# Patient Record
Sex: Female | Born: 1960 | Race: White | Hispanic: No | Marital: Married | Smoking: Never smoker
Health system: Southern US, Community
[De-identification: ages and names within clinical notes are randomized; demographics above are authoritative.]

## PROBLEM LIST (undated history)

## (undated) DIAGNOSIS — B0229 Other postherpetic nervous system involvement: Secondary | ICD-10-CM

## (undated) DIAGNOSIS — G1221 Amyotrophic lateral sclerosis: Secondary | ICD-10-CM

## (undated) HISTORY — PX: PARTIAL HYSTERECTOMY: SHX80

## (undated) HISTORY — DX: Other postherpetic nervous system involvement: B02.29

## (undated) HISTORY — PX: APPENDECTOMY: SHX54

---

## 2004-06-04 ENCOUNTER — Other Ambulatory Visit: Admission: RE | Admit: 2004-06-04 | Discharge: 2004-06-04 | Payer: Self-pay | Admitting: Family Medicine

## 2005-02-17 ENCOUNTER — Inpatient Hospital Stay (HOSPITAL_COMMUNITY): Admission: RE | Admit: 2005-02-17 | Discharge: 2005-02-19 | Payer: Self-pay | Admitting: Obstetrics and Gynecology

## 2005-02-17 ENCOUNTER — Encounter (INDEPENDENT_AMBULATORY_CARE_PROVIDER_SITE_OTHER): Payer: Self-pay | Admitting: Specialist

## 2005-05-25 ENCOUNTER — Encounter: Admission: RE | Admit: 2005-05-25 | Discharge: 2005-07-01 | Payer: Self-pay | Admitting: Family Medicine

## 2005-10-09 ENCOUNTER — Other Ambulatory Visit: Admission: RE | Admit: 2005-10-09 | Discharge: 2005-10-09 | Payer: Self-pay | Admitting: Obstetrics and Gynecology

## 2007-01-12 ENCOUNTER — Other Ambulatory Visit: Admission: RE | Admit: 2007-01-12 | Discharge: 2007-01-12 | Payer: Self-pay | Admitting: Family Medicine

## 2008-01-06 ENCOUNTER — Ambulatory Visit: Payer: Self-pay

## 2008-01-16 ENCOUNTER — Other Ambulatory Visit: Admission: RE | Admit: 2008-01-16 | Discharge: 2008-01-16 | Payer: Self-pay | Admitting: Family Medicine

## 2008-02-07 ENCOUNTER — Ambulatory Visit: Payer: Self-pay | Admitting: Cardiology

## 2009-11-04 ENCOUNTER — Other Ambulatory Visit: Admission: RE | Admit: 2009-11-04 | Discharge: 2009-11-04 | Payer: Self-pay | Admitting: Family Medicine

## 2011-03-03 NOTE — Assessment & Plan Note (Signed)
[facility]                            CARDIOLOGY OFFICE NOTE   SHAUGHNESSY, GETHERS                    MRN:          [id number]  DATE:02/07/2008                            DOB:          10-25-1960    REFERRING PHYSICIAN:  Melida Quitter, M.D.   I was asked by Dr. Holley Bouche to evaluate Lori Alvarez, a  pleasant very lively 50 year old married white female with palpitations.   She had a sinus infection about a month or so ago.  She took some over-  the-counter sinus decongestant medication.  She began to note some  palpitations and some fluttering sensations.   A CardioNet monitor was placed and it shows sinus tachycardia with  occasional PVCs.  There was no complex arrhythmia.   Her mother and sister have a history of mitral valve prolapse.  She had  a 2-D echocardiogram here in 1995, which did show prolapse, essentially  was a normal echo.  She denies orthopnea, PND or peripheral edema.  She  walks on a regular basis and denies any shortness of breath or chest  discomfort.   PAST MEDICAL HISTORY:   ALLERGIES:  SHE IS INTOLERANT TO SULFA, NITROFURANTOIN, DOXEPIN AND  NITROGLYCERIN OINTMENT.   She is not smoke, does not use any recreational products, does not drink  alcohol.   She has about one caffeinated beverage about every 3-4 days.  She tries  to walk 30 minutes a day.   She had an appendectomy and hysterectomy in the past.   CURRENT MEDICATIONS:  1. Zyrtec 10 mg two times a day.  2. Zantac 150 mg two times a day.  3. Actonel 35 mg a day.  4. Premarin 0.9 mg daily.  5. Mobic 15 mg a day.  6. Xanax 0.25 nightly as needed.  7. Various vitamins.   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   REVIEW OF SYSTEMS:  Negative other than chronic fatigue and some  allergies.   PHYSICAL EXAMINATION:  VITAL SIGNS:  She is 5 feet 3 inches and weighs  141 pounds.   Her blood pressure is 141/92, her pulse is 100 today.  It  is nice and regular.  GENERAL:  She is in no acute distress.  SKIN:  Warm and dry.  HEENT:  Unremarkable.  NECK:  Carotid upstrokes are equal bilaterally without bruits.  No JVD.  Thyroid is not enlarged or tender.  LUNGS:  Clear to auscultation.  HEART:  Reveals a nondisplaced PMI.  There is no murmur, rub or click.  S2 splits physiologically.  ABDOMEN:  Soft, good bowel sounds.  EXTREMITIES:  No clubbing, cyanosis or edema.  Pulses are intact.  NEURO:  Intact.  SKIN:  Unremarkable.   ASSESSMENT/PLAN:  1. Benign unifocal PVCs.  2. History of a normal 2-D echocardiogram with no evidence of      prolapse.  3. Healthy lifestyle.   RECOMMENDATIONS:  1. Increase walking to 3 hours per week.  2. Maintain weight.  3. Follow-up annual blood work, including lipids and  thyroid.  These      were recently checked and they were okay per the patient.  4. Reassurance.  5. Stay away from decongestants if at all possible.  6. Decrease caffeine or other stimulants as much as possible.  We will      see her back on a p.r.n. basis.     Thomas C. Daleen Squibb, MD, [facility]  Electronically Signed    TCW/MedQ  DD: 02/07/2008  DT: 02/07/2008  Job #: [id number]   cc:   Melida Quitter, M.D.

## 2011-03-06 NOTE — Discharge Summary (Signed)
Lori Alvarez, Lori Alvarez             ACCOUNT NO.:  [id number]   MEDICAL RECORD NO.:  [id number]          PATIENT TYPE:  INP   LOCATION:  9311                          FACILITY:  [facility]   PHYSICIAN:  Carrington Clamp, M.D. DATE OF BIRTH:  14-Sep-1961   DATE OF ADMISSION:  02/17/2005  DATE OF DISCHARGE:  02/19/2005                                 DISCHARGE SUMMARY   ADMITTING DIAGNOSES:  Menometrorrhagia secondary to symptomatic fibroid  uterus.   DISCHARGE DIAGNOSES:  Menometrorrhagia secondary to symptomatic fibroid  uterus.   PROCEDURES PERFORMED:  Total abdominal hysterectomy.   TEST RESULTS:  Preoperative H&H of 12.9 and 38.  Postoperative H&H 11.7 and  34.  Postoperative BUN and creatinine of 2 and 0.8.   HISTORY AND PHYSICAL:  This is a 50 year old G0 complaining of severe  menometrorrhagia responsive only to persistent doses of Ponstel, but still  bleeding through.  Patient has a large number of fibroids and desires  definitive therapy.   PAST MEDICAL HISTORY:  1.  Migraines.  2.  Chronic hives.  3.  Patient has a history of kidney stones.  4.  No coronary artery disease or diabetes.   PAST GYNECOLOGICAL HISTORY:  Negative for sexually transmitted diseases and  pelvic infections.   PAST OBSTETRICAL HISTORY:  None.   PAST SURGICAL HISTORY:  Patient had an open appendectomy without rupture.   ALLERGIES:  PERCOCET, SULFA, ALLEGRA, MACROBID, ROXOPIN, NITROGLYCERIN.   MEDICATIONS:  1.  Zyrtec 10 mg b.i.d.  2.  Zantac 150 mg b.i.d.  3.  Xanax p.r.n.   The patient did not smoke.   PHYSICAL EXAMINATION:  VITAL SIGNS:  Temperature 97, blood pressure 132/77.  GENERAL:  Well.  HEENT:  Anicteric.  NECK:  Without thyromegaly.  LUNGS:  Clear to auscultation bilaterally.  HEART:  Regular rate and rhythm.  ABDOMEN:  Soft, nontender, nondistended.  PELVIC:  Normal external female genitalia with normal vagina and cervix.  The uterus is approximately 12-14 weeks size.  Adnexa  and rectum were  benign.  EXTREMITIES:  Benign.  SKIN:  Benign.   Ultrasound showed 8 cm pedunculated fibroids in bilateral adnexa.   ASSESSMENT:  This is a 50 year old who does not desire fertility with a  significantly enlarged symptomatic fibroid uterus desiring definitive  therapy for her menometrorrhagia.  The patient was counseled and elected to  undergo a total abdominal hysterectomy.  Patient was to undergo bilateral  salpingo-oophorectomy only if the ovaries are frankly abnormal.  All risks,  benefits, and alternatives were discussed with the patient.  Patient desired  to proceed.  The patient was to receive SEDs and preoperative antibiotics.   HOSPITAL COURSE:  Patient underwent the above-named procedures on Feb 17, 2005 without complication.  Patient by postoperative day #2 was eating,  ambulating, and voiding without complication and was sent home with the  following.   MEDICATIONS:  1.  Premarin 0.3 mg daily.  2.  Ultram 50 mg q.4-6h. p.r.n. pain.  3.  Colace.  4.  Gas-X over-the-counter.   ACTIVITY:  Pelvic rest x6 weeks.  No lifting or straining x6 weeks.  No  driving x2.   DIET:  High fiber and high water.   WOUND CARE:  Shower only.   FOLLOW-UP:  The following Friday for staples to be removed.       MH/MEDQ  D:  04/23/2005  T:  04/23/2005  Job:  [id number]   cc:   Tiburcio Pea, M.D.

## 2011-03-06 NOTE — Op Note (Signed)
NAMEEDUARDO, Lori Alvarez             ACCOUNT NO.:  [id number]   MEDICAL RECORD NO.:  [id number]          PATIENT TYPE:  INP   LOCATION:  9399                          FACILITY:  [facility]   PHYSICIAN:  Carrington Clamp, M.D. DATE OF BIRTH:  Dec 02, 1960   DATE OF PROCEDURE:  DATE OF DISCHARGE:                                 OPERATIVE REPORT   Audio too short to transcribe (less than 5 seconds)      MH/MEDQ  D:  02/17/2005  T:  02/17/2005  Job:  [id number]

## 2011-03-06 NOTE — Op Note (Signed)
Lori Alvarez, CARRIGG             ACCOUNT NO.:  [id number]   MEDICAL RECORD NO.:  [id number]          PATIENT TYPE:  INP   LOCATION:  9399                          [facility]ACILITY:  [facility]   PHYSICIAN:  Carrington Clamp, M.D. DATE OF BIRTH:  1961/07/20   DATE OF PROCEDURE:  02/17/2005  DATE OF DISCHARGE:                                 OPERATIVE REPORT   PREOPERATIVE DIAGNOSES:  1.  Menometrorrhagia and fibroids.  2.  Symptomatic fibroid uterus.   POSTOPERATIVE DIAGNOSES:  1.  Menometrorrhagia and fibroids.  2.  Symptomatic fibroid uterus.   PROCEDURE:  Total abdominal hysterectomy.   SURGEON:  Carrington Clamp, M.D.   ASSISTANT:  __________.   ANESTHESIA:  General.   SPECIMENS:  Uterus and cervix which weighed greater than 300 g.   ESTIMATED BLOOD LOSS:  150 mL.   INTRAVENOUS FLUIDS:  1200 mL.   URINE OUTPUT:  400 mL.   COMPLICATIONS:  None.   FINDINGS:  About 14 week size uterus with multiple fibroids.  There were two  fibroids located in the cornua of the uterus.  The ovaries, tubes, and  ureters otherwise appeared normal.   MEDICATIONS:  None.   COUNTS:  Correct x3.   TECHNIQUE:  After adequate general anesthesia was achieved, the patient was  prepped and draped in the usual sterile fashion in dorsal supine position.  A Pfannenstiel skin incision was made with the scalpel and carried down to  the fascia with Bovie cautery.  The fascia was incised in the midline and  then carried in a transverse curvilinear manner with the Mayo scissors.  The  fascia was reflected superiorly and inferiorly from the rectus muscles and  the rectus muscles split in them midline.  Bowel-free portion of the  peritoneum was entered into bluntly and the peritoneum was then incised in  superior and inferior manner with the Metzenbaum scissors with good  visualization of the bowel and bladder.   The bowel was packed away with four wet laps and the O'Connor-O'Sullivan  retractor placed.  The  cornua of the uterus was grasped with a pair of  Kellys and the round ligaments were secured with a suture transfixion stitch  of 0 Vicryl.  The round ligaments were then divided with the Bovie cautery  which incorporated the peritoneum parallel to the infundibulopelvic ligament  and the vesicouterine fascia.  The vesicouterine fascia was carried around  to the other side where the opposite round ligament was obtained in the same  manner.  The bladder was retracted with blunt and sharp dissection.   The avascular portion of the mesosalpinx was entered into with the Bovie  cautery and Haney clamp was placed around the uterine ovarian ligament.  This was incised with the Mayo scissors and then secured with a free-hand  tie of 0 Vicryl.  This was followed with a stitch of 0 Vicryl as well.  The  same procedure was performed on the left-hand side and the vesicouterine  fascia retracted even further out of the field of dissection.   A pair of Haneys were placed at the level of the internal  os on the uterine  artery.  Each pedicle was incised with the Mayo scissor and then secured  with a stitch of 0 Vicryl.  The uterus was then amputated with the scalpel  and handed off the field.  The cervix was then grasped with a pair of  Kochers and the cardinal ligament was then divided with alternating  successive bites of the straight Haney.  Each pedicle was incised with the  scalpel and secured with a stitch of 0 Vicryl.  At the level of the  reflection of the vagina onto the cervix, a pair of Haneys were placed  bilaterally.  Each pedicle was incised with the Mayo scissors and secured  with a Heaney stitch of 0 Vicryl.  The uterine specimen was then excised  with the scalpel.  The cuff was closed with two additional figure-of-eight  stitches of 0 Vicryl.  Hemostasis was achieved with Bovie cautery along some  of the peritoneal edges but otherwise dry.  The instruments were withdrawn  from the  abdomen.   The peritoneum was then closed with a running stitch of 2-0 Vicryl.  The  fascia was closed with a running stitch of 0 PDS looped.  The subcutaneous  tissues secured with interrupted stitches of 2-0 plain gut.  The skin was  closed with staples.  The patient tolerated the procedure well and returned  to the recovery room in stable condition.      MH/MEDQ  D:  02/17/2005  T:  02/17/2005  Job:  [id number]

## 2015-01-22 DIAGNOSIS — D469 Myelodysplastic syndrome, unspecified: Secondary | ICD-10-CM | POA: Insufficient documentation

## 2015-08-15 ENCOUNTER — Other Ambulatory Visit: Payer: Self-pay | Admitting: Radiology

## 2017-07-30 ENCOUNTER — Other Ambulatory Visit: Payer: Self-pay | Admitting: Family Medicine

## 2017-07-30 DIAGNOSIS — M5412 Radiculopathy, cervical region: Secondary | ICD-10-CM

## 2017-08-08 ENCOUNTER — Other Ambulatory Visit: Payer: Self-pay

## 2017-08-15 ENCOUNTER — Other Ambulatory Visit: Payer: Self-pay

## 2017-08-30 ENCOUNTER — Other Ambulatory Visit: Payer: Self-pay | Admitting: Family Medicine

## 2017-08-30 ENCOUNTER — Ambulatory Visit
Admission: RE | Admit: 2017-08-30 | Discharge: 2017-08-30 | Disposition: A | Discharge status: Home / Self Care | Payer: Self-pay | Source: Ambulatory Visit | Attending: Family Medicine | Admitting: Family Medicine

## 2017-08-30 DIAGNOSIS — M542 Cervicalgia: Secondary | ICD-10-CM

## 2017-10-02 ENCOUNTER — Ambulatory Visit
Admission: RE | Admit: 2017-10-02 | Discharge: 2017-10-02 | Disposition: A | Discharge status: Home / Self Care | Payer: BC Managed Care – PPO | Source: Ambulatory Visit | Attending: Family Medicine | Admitting: Family Medicine

## 2017-10-02 DIAGNOSIS — M5412 Radiculopathy, cervical region: Secondary | ICD-10-CM

## 2017-11-11 DIAGNOSIS — B0229 Other postherpetic nervous system involvement: Secondary | ICD-10-CM | POA: Insufficient documentation

## 2017-11-11 DIAGNOSIS — R030 Elevated blood-pressure reading, without diagnosis of hypertension: Secondary | ICD-10-CM | POA: Insufficient documentation

## 2017-11-11 DIAGNOSIS — R29898 Other symptoms and signs involving the musculoskeletal system: Secondary | ICD-10-CM | POA: Insufficient documentation

## 2017-11-22 ENCOUNTER — Telehealth: Payer: Self-pay | Admitting: Neurology

## 2017-11-22 NOTE — Telephone Encounter (Signed)
Pt returned RN's call,she accepted the appt in the morning, I scheduled it. She was very Patent attorney to [employer]

## 2017-11-22 NOTE — Telephone Encounter (Signed)
Noted, thank you Lori Alvarez- Do you have this paper referral? She is going to come tomorrow at 730am, thank you!

## 2017-11-22 NOTE — Telephone Encounter (Signed)
Will bring the paper referral back when we bring the ones for Wednesday.         Thanks [employer]

## 2017-11-22 NOTE — Telephone Encounter (Signed)
Called and LVM for pt to call office back.  Can offer tomorrow morning at 730am, check in 700am for sooner appt if she calls, thank you

## 2017-11-22 NOTE — Telephone Encounter (Signed)
Patient is a new patient scheduled with Dr. Jannifer Franklin on 01/10/18 for muscle weakness. Patient has called and wants to speak with the nurse to see if she can be seen sooner. She said that the weakness has got to the point she can hardly raise her hands above her head.

## 2017-11-23 ENCOUNTER — Encounter: Payer: Self-pay | Admitting: Neurology

## 2017-11-23 ENCOUNTER — Other Ambulatory Visit: Payer: Self-pay

## 2017-11-23 ENCOUNTER — Ambulatory Visit: Payer: BC Managed Care – PPO | Admitting: Neurology

## 2017-11-23 VITALS — BP 141/74 | HR 100 | Ht 63.0 in | Wt 131.5 lb

## 2017-11-23 DIAGNOSIS — M6281 Muscle weakness (generalized): Secondary | ICD-10-CM | POA: Insufficient documentation

## 2017-11-23 NOTE — Patient Instructions (Signed)
   We will get EMG and NCV evaluation.

## 2017-11-23 NOTE — Progress Notes (Signed)
Reason for visit: Quadriparesis, progressive  Referring physician: Dr. Nils Alvarez is a 57 y.o. female  History of present illness:  [name] is a 57 year old right-handed white female with a history of a shingles outbreak that affected the left shoulder and arm in February 2015.  The patient had no weakness of the left arm at that time.  The patient has continued to have pain however, she has been on gabapentin in the past but did not gain much benefit from this.  The patient began noting bilateral arm weakness that began in December 2017.  Over the last year, the arm weakness has gradually worsened.  The patient received a shingles vaccination on 18 October 2017 and since that time she has had accelerated weakness of both upper extremities.  She is having difficulty with dropping things and lifting the arms above the head.  She has not noticed any weakness of the lower extremities.  She denies any numbness of the extremities, but she has not had any problems with speech or swallowing or double vision or ptosis of the eyes.  She has not noted any difficulty controlling the bowels or the bladder.  She has had not had any balance changes.  She has had routine blood work done through her primary care physician, she is also had a connective tissue disease workup that apparently was unremarkable.  The patient denies any significant muscle cramps, she does have some twitches in the muscles at times in the legs.  The patient has not noted any significant muscle atrophy.  She is sent to this office for an evaluation.  The arm weakness has significantly worsened over the last 2 or 3 weeks.  Past Medical History:  Diagnosis Date  . Postherpetic neuralgia     Past Surgical History:  Procedure Laterality Date  . APPENDECTOMY    . PARTIAL HYSTERECTOMY      Family History  Problem Relation Age of Onset  . Myopathy Mother   . Cancer Father        renal    Social history:  reports  that  has never smoked. she has never used smokeless tobacco. She reports that she does not drink alcohol or use drugs.  Medications:  Prior to Admission medications   Medication Sig Start Date End Date Taking? Authorizing Provider  ALPRAZolam Duanne Moron) 0.5 MG tablet Take 0.5 mg by mouth as needed for anxiety.   Yes [provider]  Black Cohosh (REMIFEMIN) 20 MG TABS Take 20 mg by mouth 2 (two) times daily.   Yes [provider]  CALCIUM-VITAMIN D PO Take 1 tablet by mouth daily. 600-800   Yes [provider]  cetirizine (ZYRTEC) 10 MG tablet Take 10 mg by mouth 2 (two) times daily.   Yes [provider]  Cyanocobalamin (VITAMIN B-12 PO) Take 500 mcg by mouth daily.   Yes [provider]  estrogens, conjugated, (PREMARIN) 0.9 MG tablet Take 0.9 mg by mouth daily. Take daily for 21 days then do not take for 7 days.   Yes [provider]  MAGNESIUM PO Take 250 mg by mouth 2 (two) times daily.   Yes [provider]  meloxicam (MOBIC) 15 MG tablet Take 15 mg by mouth daily.   Yes [provider]  Multiple Vitamins-Minerals (MULTIVITAMIN ADULTS PO) Take 1 tablet by mouth daily.   Yes [provider]  ranitidine (ZANTAC) 150 MG tablet Take 150 mg by mouth 2 (two) times  daily.   Yes [provider]  risedronate (ACTONEL) 35 MG tablet Take 35 mg by mouth every 7 (seven) days. with water on empty stomach, nothing by mouth or lie down for next 30 minutes.   Yes [provider]  VITAMIN E PO Take 400 Units by mouth daily.   Yes [provider]  Zinc 50 MG TABS Take 50 mg by mouth daily.   Yes [provider]      Allergies  Allergen Reactions  . Allegra [Fexofenadine]   . Clotrim-Undecyl-Tea Tr-Lav Oil   . Doxepin   . Nitroglycerin     Migraine  . Nitrofuran Derivatives Rash  . Sulfa Antibiotics Rash    ROS:  Out of a complete 14 system review of symptoms, the patient complains  only of the following symptoms, and all other reviewed systems are negative.  Fatigue Left arm and shoulder pain Muscle weakness Anxiety, decreased energy  Blood pressure (!) 141/74, pulse 100, height 5\' 3"  (1.6 m), weight 131 lb 8 oz (59.6 kg).  Physical Exam  General: The patient is alert and cooperative at the time of the examination.  Eyes: Pupils are equal, round, and reactive to light. Discs are flat bilaterally.  Neck: The neck is supple, no carotid bruits are noted.  Respiratory: The respiratory examination is clear.  Cardiovascular: The cardiovascular examination reveals a regular rate and rhythm, no obvious murmurs or rubs are noted.  Skin: Extremities are without significant edema.  Neurologic Exam  Mental status: The patient is alert and oriented x 3 at the time of the examination. The patient has apparent normal recent and remote memory, with an apparently normal attention span and concentration ability.  Cranial nerves: Facial symmetry is present. There is good sensation of the face to pinprick and soft touch bilaterally. The strength of the facial muscles and the muscles to head turning and shoulder shrug are normal bilaterally. Speech is well enunciated, no aphasia or dysarthria is noted. Extraocular movements are full. Visual fields are full. The tongue is midline, and the patient has symmetric elevation of the soft palate. No obvious hearing deficits are noted.  The patient has weakness with neck flexion, not with extension of the neck.  Motor: The motor testing reveals 4/5 strength of all 4 extremities, distally and proximally, possibly slightly weaker proximally on the left arm than the right, but quite symmetric in the legs. Good symmetric motor tone is noted throughout.  Sensory: Sensory testing is intact to pinprick, soft touch, vibration sensation, and position sense on all 4 extremities. No evidence of extinction is noted.  Coordination: Cerebellar testing  reveals good finger-nose-finger and heel-to-shin bilaterally.  Gait and station: Gait is normal. Tandem gait is normal. Romberg is negative. No drift is seen.  Reflexes: Deep tendon reflexes are symmetric and normal bilaterally. Toes are downgoing bilaterally.   MR cervical 10/02/17:   IMPRESSION: 1. 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 2. No spinal stenosis.  * MRI scan images were reviewed online. I agree with the written report.   Assessment/Plan:  1.  Quadriparesis, progressive  The patient appears to have diffuse muscle weakness affecting all 4 extremities including neck flexion.  The weakness is symmetric, and is equal with proximal and distal muscles.  No evidence of spasticity is seen.  No evidence of fasciculations or muscle atrophy are noted.  The patient will need to be evaluated for a progressive myopathy, Lambert-Eaton syndrome and myasthenia  gravis do need to be considered.  An anterior horn cell disease process is less likely given the absence of atrophy and presence of symmetry of weakness.  An endocrine based myopathy should also be considered.  The patient will have blood work done today, she will have nerve conduction studies on both arms and one leg, EMG on one arm and one leg.  She will follow-up for the EMG evaluation.  Jill Alexanders MD 11/23/2017 8:06 AM  [facility] [address] [place], [place] [address]  Phone [phone] Fax [phone]

## 2017-12-01 ENCOUNTER — Encounter: Payer: Self-pay | Admitting: Neurology

## 2017-12-01 ENCOUNTER — Ambulatory Visit (INDEPENDENT_AMBULATORY_CARE_PROVIDER_SITE_OTHER): Payer: BC Managed Care – PPO | Admitting: Neurology

## 2017-12-01 ENCOUNTER — Ambulatory Visit: Payer: BC Managed Care – PPO | Admitting: Neurology

## 2017-12-01 DIAGNOSIS — G1221 Amyotrophic lateral sclerosis: Secondary | ICD-10-CM | POA: Diagnosis not present

## 2017-12-01 DIAGNOSIS — M6281 Muscle weakness (generalized): Secondary | ICD-10-CM

## 2017-12-01 LAB — ANA W/REFLEX: Anti Nuclear Antibody(ANA): NEGATIVE

## 2017-12-01 LAB — SEDIMENTATION RATE: Sed Rate: 2 mm/hr (ref 0–40)

## 2017-12-01 LAB — PARANEOPLASTIC PROFILE 1: Neuronal Nuc Ab (Ri), IFA: <1:10 {titer}

## 2017-12-01 LAB — VGCC ANTIBODY: VGCC Antibody: NEGATIVE

## 2017-12-01 LAB — ACETYLCHOLINE RECEPTOR, MODULATING

## 2017-12-01 LAB — ACETYLCHOLINE RECEPTOR, BINDING: AChR Binding Ab, Serum: <0.03 nmol/L (ref 0.00–0.24)

## 2017-12-01 LAB — ANGIOTENSIN CONVERTING ENZYME: ANGIO CONVERT ENZYME: 24 U/L (ref 14–82)

## 2017-12-01 LAB — CK: CK TOTAL: 90 U/L (ref 24–173)

## 2017-12-01 LAB — HIV ANTIBODY (ROUTINE TESTING W REFLEX): HIV Screen 4th Generation wRfx: NONREACTIVE

## 2017-12-01 LAB — ACETYLCHOLINE RECEPTOR, BLOCKING: Acetylchol Block Ab: 9 % (ref 0–25)

## 2017-12-01 NOTE — Procedures (Signed)
HISTORY:  Lori Alvarez is a 57 year old patient with a history of gradual weakness of all 4 extremities that has come on over the last 1 year.  The weakness she believes was first noted on the left arm, but both arms and now involved and she is weak in both legs.  The patient is being evaluated for a possible myopathic disorder.  NERVE CONDUCTION STUDIES:  Nerve conduction studies were performed on both upper extremities.  The distal motor latencies for the median and ulnar nerves are within normal limits bilaterally with low motor amplitudes for the median nerves bilaterally and for the left ulnar nerve, normal for the right ulnar nerve.  The nerve conduction velocities for the median and ulnar nerves were normal bilaterally.  The sensory latencies for the median nerves were slightly prolonged on the left, normal on the right and normal for the ulnar nerves bilaterally.  The F-wave latencies for the ulnar nerves were normal bilaterally.  Nerve conduction studies were performed on the left lower extremity. The distal motor latencies and motor amplitudes for the peroneal and posterior tibial nerves were within normal limits. The nerve conduction velocities for these nerves were also normal. The sensory latencies for the peroneal and sural nerves were within normal limits.  The F-wave latency for the left posterior tibial nerve was normal.   EMG STUDIES:  EMG study was performed on the left upper extremity:  The first dorsal interosseous muscle reveals 2 to 4 K units with decreased recruitment. No fibrillations or positive waves were noted. The abductor pollicis brevis muscle reveals 2 to 4 K units with moderately decreased recruitment.  3+ fibrillations and positive waves were noted. The extensor indicis proprius muscle reveals 1 to 3 K units with decreased recruitment.  1+ fibrillations and positive waves were noted. The pronator teres muscle reveals 2 to 3 K units with decreased  recruitment.  3+ fibrillations and positive waves were noted. The biceps muscle reveals 1 to 2 K units with decreased recruitment.  2+ fibrillations and positive waves were noted. The triceps muscle reveals 2 to 4 K units with slightly decreased recruitment.  1+ fibrillations and positive waves were noted. The anterior deltoid muscle reveals 2 to 3 K units with decreased recruitment.  2+ fibrillations and positive waves were noted. The cervical paraspinal muscles were tested at 2 levels. No abnormalities of insertional activity were seen at either level tested. There was poor relaxation.  EMG study was performed on the left lower extremity:  The tibialis anterior muscle reveals 2 to 4K motor units with full recruitment. No fibrillations or positive waves were seen. The peroneus tertius muscle reveals 2 to 4K motor units with full recruitment. No fibrillations or positive waves were seen. The medial gastrocnemius muscle reveals 1 to 3K motor units with full recruitment. No fibrillations or positive waves were seen. The vastus lateralis muscle reveals 2 to 4K motor units with full recruitment. No fibrillations or positive waves were seen. The iliopsoas muscle reveals 2 to 4K motor units with full recruitment. No fibrillations or positive waves were seen. The biceps femoris muscle (long head) reveals 2 to 4K motor units with full recruitment. No fibrillations or positive waves were seen. The lumbosacral paraspinal muscles were tested at 3 levels, and revealed no abnormalities of insertional activity at all 3 levels tested. There was fair relaxation.    IMPRESSION:  Nerve conduction studies done on both upper extremities shows low motor amplitudes for the median nerves bilaterally and  in the left ulnar nerve.  There is relative sensory sparing.  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  Given the clinical  history and MRI findings, the possibility of an anterior horn cell disease process such as ALS should be considered.  Jill Alexanders MD 12/01/2017 3:19 PM  [facility] [address] [place], [place] [address]  Phone [phone] Fax [phone]

## 2017-12-01 NOTE — Progress Notes (Addendum)
Patient comes in today for EMG and nerve conduction study evaluation.  The patient has weakness on all 4 extremities that is relatively symmetric.  Reflexes are well-maintained on all 4 extremities.  The nerve conduction studies showed low motor amplitudes in the upper extremities, normal in the left lower extremity. 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  MRI of the cervical spine does not explain the symptoms involving the left arm.  The possibility of an anterior horn cell disease process is entertained and discussed with the patient.  The patient has been having progressive symptoms for about a year and a half.  We will get a second opinion through Dr. Vallarie Mare at [facility].  The patient will go on gabapentin for the post herpetic neuralgia pain that she has in her shoulder area.  She will call for any dose adjustments, if the gabapentin is not effective we will use carbamazepine.    [place]    Nerve / Sites Muscle Latency Ref. Amplitude Ref. Rel Amp Segments Distance Velocity Ref. Area    ms ms mV mV %  cm m/s m/s mVms  L Median - APB     Wrist APB 4.2 ?4.4 2.8 ?4.0 100 Wrist - APB 7   13.4     Upper arm APB 7.8  2.7  94.9 Upper arm - Wrist 20 55 ?49 13.0  R Median - APB     Wrist APB 3.6 ?4.4 3.9 ?4.0 100 Wrist - APB 7   15.4     Upper arm APB 6.9  3.7  96.2 Upper arm - Wrist 19 59 ?49 15.1  L Ulnar - ADM     Wrist ADM 3.3 ?3.3 5.0 ?6.0 100 Wrist - ADM 7   17.7     B.Elbow ADM 6.3  4.6  91.7 B.Elbow - Wrist 17 56 ?49 17.2     A.Elbow ADM 8.1  4.5  97 A.Elbow - B.Elbow 10 56 ?49 17.6         A.Elbow - Wrist      R Ulnar - ADM     Wrist ADM 3.2 ?3.3 7.7 ?6.0 100 Wrist - ADM 7   18.8     B.Elbow ADM 5.8  7.0  90 B.Elbow - Wrist 17 64 ?49 17.2     A.Elbow ADM 7.3  7.1  102 A.Elbow - B.Elbow 10 66 ?49 16.8         A.Elbow - Wrist      L Peroneal - EDB     Ankle EDB 4.4 ?6.5 5.2 ?2.0 100 Ankle - EDB 9   15.8   Fib head EDB 10.1  4.7  90.5 Fib head - Ankle   ?44 14.8     Pop fossa EDB 11.8  5.5  118 Pop fossa - Fib head 10 56 ?44 16.4         Pop fossa - Ankle      L Tibial - AH     Ankle AH 4.4 ?5.8 12.4 ?4.0 100 Ankle - AH 9   29.6     Pop fossa AH 12.1  10.3  83.2 Pop fossa - Ankle 37 48 ?41 25.6                 SNC    Nerve / Sites Rec. Site Peak Lat Ref.  Amp Ref. Segments Distance Peak Diff Ref.    ms ms V V  cm ms ms  L Sural - Ankle (Calf)     Calf Ankle 4.3 ?4.4 12 ?6 Calf - Ankle 14    L Superficial peroneal - Ankle     Lat leg Ankle 3.6 ?4.4 7 ?6 Lat leg - Ankle 14    L Median, Ulnar - Transcarpal comparison     Median Palm Wrist 2.8 ?2.2 32 ?35 Median Palm - Wrist 8       Ulnar Palm Wrist 2.1 ?2.2 24 ?12 Ulnar Palm - Wrist 8          Median Palm - Ulnar Palm  0.6 ?0.4  R Median, Ulnar - Transcarpal comparison     Median Palm Wrist 2.0 ?2.2 58 ?35 Median Palm - Wrist 8       Ulnar Palm Wrist 2.0 ?2.2 32 ?12 Ulnar Palm - Wrist 8          Median Palm - Ulnar Palm  0.1 ?0.4  L Median - Orthodromic (Dig II, Mid palm)     Dig II Wrist 3.5 ?3.4 16 ?10 Dig II - Wrist 13    R Median - Orthodromic (Dig II, Mid palm)     Dig II Wrist 3.0 ?3.4 26 ?10 Dig II - Wrist 13    L Ulnar - Orthodromic, (Dig V, Mid palm)     Dig V Wrist 2.8 ?3.1 13 ?5 Dig V - Wrist 11    R Ulnar - Orthodromic, (Dig V, Mid palm)     Dig V Wrist 2.5 ?3.1 25 ?5 Dig V - Wrist 61                       F  Wave    Nerve F Lat Ref.   ms ms  L Ulnar - ADM 26.8 ?32.0  L Tibial - AH 45.3 ?56.0  R Ulnar - ADM 26.1 ?32.0           EMG full

## 2017-12-01 NOTE — Progress Notes (Signed)
Please refer to EMG and nerve conduction study procedure note. 

## 2017-12-07 ENCOUNTER — Telehealth: Payer: Self-pay | Admitting: Neurology

## 2017-12-07 NOTE — Telephone Encounter (Signed)
Patient had NCV/EMG on 12-01-17. The day after she had swelling on her left arm and used ice pack and swelling went down. The next day her left arm was bruised and  is still bruised. Should she be concerned?

## 2017-12-07 NOTE — Telephone Encounter (Signed)
Spoke to patient - states her swelling resolved with ice and now she just has some bruising on her arm.  She had the school nurse, at her place of employment, take a look at her arm.  No swelling, redness or heat at site.  The nurse felt it was just a bruise and was healing at an expected pace.  The patient appreciated the return call.

## 2017-12-23 DIAGNOSIS — G1221 Amyotrophic lateral sclerosis: Secondary | ICD-10-CM | POA: Insufficient documentation

## 2018-01-10 ENCOUNTER — Ambulatory Visit: Payer: BC Managed Care – PPO | Admitting: Neurology

## 2018-01-24 ENCOUNTER — Ambulatory Visit: Payer: BC Managed Care – PPO | Admitting: Neurology

## 2018-02-28 ENCOUNTER — Ambulatory Visit
Admission: RE | Admit: 2018-02-28 | Discharge: 2018-02-28 | Disposition: A | Discharge status: Home / Self Care | Payer: BC Managed Care – PPO | Source: Ambulatory Visit | Attending: Family Medicine | Admitting: Family Medicine

## 2018-02-28 ENCOUNTER — Other Ambulatory Visit: Payer: Self-pay | Admitting: Family Medicine

## 2018-02-28 DIAGNOSIS — M546 Pain in thoracic spine: Secondary | ICD-10-CM

## 2018-07-20 DIAGNOSIS — R208 Other disturbances of skin sensation: Secondary | ICD-10-CM | POA: Insufficient documentation

## 2018-07-28 ENCOUNTER — Ambulatory Visit: Payer: BC Managed Care – PPO | Admitting: Psychiatry

## 2018-07-28 ENCOUNTER — Encounter: Payer: Self-pay | Admitting: Psychiatry

## 2018-07-28 DIAGNOSIS — G1221 Amyotrophic lateral sclerosis: Secondary | ICD-10-CM

## 2018-07-28 DIAGNOSIS — F4323 Adjustment disorder with mixed anxiety and depressed mood: Secondary | ICD-10-CM | POA: Diagnosis not present

## 2018-07-28 DIAGNOSIS — K52831 Collagenous colitis: Secondary | ICD-10-CM | POA: Diagnosis not present

## 2018-07-28 NOTE — Progress Notes (Signed)
[facility] Counselor/Therapist Progress Note   Patient ID: Lori Alvarez, MRN: [id number]  Date: 07/28/2018  Timespent: 50 minutes  Start time: 3:11p Stop time: 4:13p  Treatment Type: Individual  Subjective:  Here c/o friend Lori Alvarez, former PT.  Cont allergic colitis, flared with reduced medication.  On phone with GI office at arrival time.   Cont to weaken in arms and lose fine motor dexterity.  Glad to get to go to the [employer] event last night, fatigued today ("a good tired").  Trying out housekeepers lately, recently engaged a very good fit wit ha very professional, economical cleaner just going into business.    Had meeting with H and Senior Pastor Lori Alvarez), established awareness that she is in therapy here, fostered compassionate presence and openness about her illness and PT's wishes about privacy within the church. Beginning to let H help her with ADLs.  H is scheduled with colleague (F. May) next month.    Up to about 8-9 friends now who know.  Many of them want to discuss her weight loss, and advise her to eat, which is awkward/off-putting.  Put together an email briefing to her set of friends, educating them on her wishes for what to talk about and not have to.  Good reception from all but one, Lori Alvarez, who has not yet responded.  Considering prompting her to look at her email by (artificially) sending a cute share email, texting her to let her know its there, and hoping it is bait enough for her to see PT's more serious message, which, though a group message, had Lori Alvarez most in mind as the friend to redirect about intrusive questions.  Has met new neurologist, Dr. Tillman Abide, with [facility].  Picked up on a cancellation that seems providential.  Very professional, attentive resident involved.  Very positive experience of doctor reviewing her notes taken, to validate and reconcile information; sharp contrast to previous neurologist, who had twice snatched her notes  away from her.  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  New neuro very understanding about her declining the infusion prior neuro demanded she take.  Has f/u with OT and Respiratory Tx at the prior clinic, cont to get services.  Working to pace her exposure to PTs worse off with ALS.  See God acting in access, professionalism, rapport, and   Interventions:Strength-based, Assertiveness Training and Supportive  Reviewed problems list in system, decided which dxs to include or keep private.  Affirmed assertiveness with friends and growth in directness, disclosure, and trust.  Advised against oblique ways of prompting Lori Alvarez, better to either let Lori Alvarez see it in her own time and bring it up or ask directly.  Also discussed using the reward principle, I.e., when Lori Alvarez intrudes then retracts ("That's OK.  You don't want to talk about that.") to say "Thank you."  Mental Status Exam:   Appearance:   Neat and Well Groomed     Behavior:  Appropriate and Sharing  Motor:    Muscle weakness, dropped purse x1  Speech/Language:   Clear and Coherent  Affect:  Appropriate and Congruent  Mood:  dysthymic and much milder  Thought process:  Coherent  Thought content:    Logical  Perceptual disturbances:    Normal  Orientation:  Full (Time, Place, and Person)  Attention:  Good  Concentration:  good  Memory:  N/A  Fund of knowledge:   Good  Insight:    Good  Judgment:  Good  Impulse Control:  good    Reported Symptoms:  Fatigability, weakness, social anxiety (much less), dysphoria (reduced)  Risk Assessment: Danger to Self:  No Self-injurious Behavior: No Danger to Others: No Duty to Warn:no Physical Aggression / Violence:No  Access to Firearms a concern: No  Gang Involvement:No   Diagnosis:   ICD-10-CM   1. Adjustment disorder with mixed anxiety and depressed mood F43.23    Primarily about degenerative neuro illness Declines sharing in Problem List (07/28/18 AM)  2. Collagenous colitis  K52.831   3. ALS (amyotrophic lateral sclerosis) ([place]) G12.21      Plan:  . Option to be more direct with friend Lori Alvarez, in hopes it communicates trust and avoids misinterpretation . Continue to utilize previously learned skills ad lib . Maintain medication, if prescribed, and work faithfully with relevant prescriber(s) . Call the clinic on-call service, present to ER, or call 911 if any life-threatening emergency   Blanchie Serve, PhD

## 2018-07-28 NOTE — Patient Instructions (Signed)
Great work communicating with your support system!  I hope you find it becomes increasingly OK to be direct with the people you trust, and that they keep responding like the mature people they are.

## 2018-08-09 DIAGNOSIS — R682 Dry mouth, unspecified: Secondary | ICD-10-CM | POA: Insufficient documentation

## 2018-08-19 DIAGNOSIS — K52831 Collagenous colitis: Secondary | ICD-10-CM | POA: Diagnosis not present

## 2018-08-24 ENCOUNTER — Ambulatory Visit (INDEPENDENT_AMBULATORY_CARE_PROVIDER_SITE_OTHER): Payer: Medicare Other | Admitting: Psychiatry

## 2018-08-24 DIAGNOSIS — K52831 Collagenous colitis: Secondary | ICD-10-CM

## 2018-08-24 DIAGNOSIS — F4323 Adjustment disorder with mixed anxiety and depressed mood: Secondary | ICD-10-CM | POA: Diagnosis not present

## 2018-08-24 DIAGNOSIS — G1221 Amyotrophic lateral sclerosis: Secondary | ICD-10-CM | POA: Diagnosis not present

## 2018-08-24 NOTE — Progress Notes (Signed)
      [facility] Counselor/Therapist Progress Note   Patient ID: Lori Alvarez, MRN: [id number]  Date:  08/24/2018  Start time: 2:15p    Stop time: 3:20p Time Spent: 65 min  Treatment Type: Individual  Subjective: Tough day today for sleep loss.  Stress navigating change to Medicare plus [employer] and getting both to figure out new roles and benefits.  C/o increasing weakness in arms, attrib to progress of ALS.  qAM wakes up weak and tearful, then showering is "torture", with need of husband's physical help.  Using a heavy tericloth towel robe now as a a form-fitting, low-effort towel.  Hand strength waning, especially right hand, and accommodations for handwriting not necessarily working.  Hard to summon for breakfast sometimes, given how depressing it is.  Trying to practice gratitude for abilities that remain, but it is substantially depressing to see only decline in physical capacities, and off-putting to see prospect of being asked a lot how she is doing.    Positive experience working with new physician, Deliah Goody, [place] [facility].  Mesalamine DR now prescribed for collagenous colitis.  Hopeful of better effect.  Increased difficulty sleeping, responds to 0.5mg  Xanax, sometimes needs another 0.25mg  after waking.  Adjusted electronic screen, tried melatonin (which hit her too hard).  Biggest detriment to sleep is sister's pushiness (e.g., about not notifying family of her ALS, need to "forgive" her aunt, and incorrigible monologuing when she purports to be getting in touch to be "supportive").  Has an unanswered email from her provoke sensitive reaction.   Difficult establishing boundaries for phone calls.  Interventions: Assertiveness/Communication and Ego-Supportive Processed issues with sister's dysfunctional support, weighing communication options.  Best-liked option to ask sister, when she presses the moment, "What do you need from me right  now?"  Mental Status Exam:    Appearance:   Neat     Behavior:  Appropriate  Motor:  Normal and except ALS-related weakness  Speech/Language:   Clear and Coherent  Affect:  Appropriate  Mood:  depressed  Thought process:  normal  Thought content:    WNL  Sensory/Perceptual disturbances:    WNL  Orientation:  WNL  Attention:  Good  Concentration:  Good  Memory:  WNL  Fund of knowledge:   Good  Insight:    Good  Judgment:   Good  Impulse Control:  Good   Risk Assessment: Danger to Self:  No Self-injurious Behavior: No Danger to Others: No Duty to Warn:no Physical Aggression / Violence:No  Access to Firearms a concern: No   Diagnosis:   ICD-10-CM   1. Adjustment disorder with mixed anxiety and depressed mood F43.23   2. ALS (amyotrophic lateral sclerosis) ([facility]) G12.21   3. Collagenous colitis K52.831    Plan:  . Try assertiveness options with sister . Continue to utilize previously learned skills ad lib . Maintain medication, if prescribed, and work faithfully with relevant prescriber(s) . Call the clinic on-call service, present to ER, or call 911 if any life-threatening emergency . Follow up with me in about 3 weeks, as able    Blanchie Serve, PhD

## 2018-09-20 ENCOUNTER — Ambulatory Visit (INDEPENDENT_AMBULATORY_CARE_PROVIDER_SITE_OTHER): Payer: Medicare Other | Admitting: Psychiatry

## 2018-09-20 DIAGNOSIS — K52831 Collagenous colitis: Secondary | ICD-10-CM

## 2018-09-20 DIAGNOSIS — F4323 Adjustment disorder with mixed anxiety and depressed mood: Secondary | ICD-10-CM

## 2018-09-20 DIAGNOSIS — G1221 Amyotrophic lateral sclerosis: Secondary | ICD-10-CM

## 2018-09-20 NOTE — Progress Notes (Signed)
Psychotherapy Progress Note -- Lori Moore, PhD, [facility]  Patient ID: Lori Alvarez     MRN: [id number]     Date: 09/20/2018     Treatment Type: Individual psychotherapy Start: 2:14p Stop: 3:14p Time Spent: 60 min Accompanied by: none  Self-Report (interim history, self-report of stressors and symptoms, application of prior therapy, status changes) Energy continues to wane, attributed to ALS and limited ability to eat due to colitis.  Felt a need for variety and had a few chicken nuggets and fries, but could not tolerate more than a few.  Generally experiences urgent bowel movements 2 hours after eating.  Attended a friend's family funeral, unexpectedly and uncomfortably ran into principal.  On new nonsteroidal medication.  Second-guesses original decision to go on infusion, as colitis showed up afterwards.  Still reassured by new GI dr.  Buddy Duty a melatonin 1mg  at night, which is very effective.  Has begun an evening light curfew.  Struggling with grief as holidays come on.  Difficult negotiating with sister about holiday decorating -- can't do the work, would rather not go to the trouble, and would feel bad if she let sister do for her.  Also chronic tendencies for sister to offer help then change the subject without picking up PT's answers, be too restless to hear feelings, and to imply expectations when asking PT's wishes.  Grieves lack of effective empathy from sister Lori Alvarez and the old support, understanding, and belonging that came with mother, now deceased 50 years.  Trying to use fine motor dexterity wherever possible, including handwriting rather than typing or texting, but hands do give out more easily over time.  Has considered consulting a nutritionist, but figures they could not do much for her.  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  Continues to pray for a miracle, albeit with the  understanding that God's healing does not necessarily mean in this life, and the prospect of decline and death remains daunting  Therapies used: Cognitive Behavioral Therapy, Assertiveness/Communication, Solution-Oriented/Positive Psychology, Humanistic/Existential and faith-sensitive  Intervention notes: Support for what appears to be intractable and increasing disability and for lonely making experiences of inconsistent, conflicted, or non-understanding attempts to sympathize, to her up, or direct her energies.  Psychoeducation about fried fast food and digestion and about anecdotal evidence that an anti-inflammatory diet may help both with colitis and ALS.  Reviewed sleep measures and practice, affirming use of blue light control and regular bedtimes.  Reviewed efforts to overcome fine motor limitations, endorsing both attempts to use motor skills and to modify tasks like card writing with stickers, stamps, having huisband sign when her hand gives out, etc.  Process unnecessary guilt over feelings toward former principal and sister and normalized as some progress in direct attempts to communicate with them where her needs or changes in behavior require them.  Mental Status/Observations:     Appearance:   Neat     Behavior:  Appropriate  Motor:  weakened fine motor control; grossly intact otherwise  Speech/Language:   Normal Rate and possibly slight dysarthria, not sure  Affect:  Appropriate and Depressed, tearful at points  Mood:  anxious and depressed  Thought process:  normal  Thought content:    WNL  Sensory/Perceptual disturbances:    WNL  Orientation:  WNL  Attention:  Good  Concentration:  Good  Memory:  WNL  Fund of knowledge:   NA  Insight:  Good  Judgment:   Good  Impulse Control:  Good   Risk Assessment: Danger to Self:  No Self-injurious Behavior: No Danger to Others: No Duty to Warn:no Physical Aggression / Violence:No  Access to Firearms a concern: No    Diagnosis:   ICD-10-CM   1. Adjustment disorder with mixed anxiety and depressed mood F43.23   2. ALS (amyotrophic lateral sclerosis) (H[facility]) G12.21   3. Collagenous colitis K52.831     Progress rating:  No change  Plan:  . Option to see a nutritionist or attempt anti-inflammatory diet on her own.  Brief educational materials available if desired. . Continue to utilize previously learned skills ad lib . Maintain medication, if prescribed, and work faithfully with relevant prescriber(s) . Call the clinic on-call service, present to ER, or call 911 if any life-threatening emergency . Follow up with me in about 2-4 weeks as able  Blanchie Serve, PhD

## 2018-09-21 ENCOUNTER — Ambulatory Visit: Payer: BC Managed Care – PPO | Admitting: Psychiatry

## 2018-10-17 ENCOUNTER — Ambulatory Visit: Payer: Medicare Other | Admitting: Psychiatry

## 2018-10-21 DIAGNOSIS — R197 Diarrhea, unspecified: Secondary | ICD-10-CM | POA: Diagnosis not present

## 2018-10-21 DIAGNOSIS — R634 Abnormal weight loss: Secondary | ICD-10-CM | POA: Diagnosis not present

## 2018-10-21 DIAGNOSIS — R109 Unspecified abdominal pain: Secondary | ICD-10-CM | POA: Diagnosis not present

## 2018-10-21 DIAGNOSIS — K52831 Collagenous colitis: Secondary | ICD-10-CM | POA: Diagnosis not present

## 2018-10-26 DIAGNOSIS — M791 Myalgia, unspecified site: Secondary | ICD-10-CM | POA: Diagnosis not present

## 2018-10-26 DIAGNOSIS — M7918 Myalgia, other site: Secondary | ICD-10-CM | POA: Diagnosis not present

## 2018-10-26 DIAGNOSIS — R4589 Other symptoms and signs involving emotional state: Secondary | ICD-10-CM | POA: Diagnosis not present

## 2018-10-26 DIAGNOSIS — R29898 Other symptoms and signs involving the musculoskeletal system: Secondary | ICD-10-CM | POA: Diagnosis not present

## 2018-10-26 DIAGNOSIS — Z79899 Other long term (current) drug therapy: Secondary | ICD-10-CM | POA: Diagnosis not present

## 2018-10-26 DIAGNOSIS — K529 Noninfective gastroenteritis and colitis, unspecified: Secondary | ICD-10-CM | POA: Diagnosis not present

## 2018-10-26 DIAGNOSIS — R531 Weakness: Secondary | ICD-10-CM | POA: Diagnosis not present

## 2018-10-26 DIAGNOSIS — R634 Abnormal weight loss: Secondary | ICD-10-CM | POA: Diagnosis not present

## 2018-10-26 DIAGNOSIS — R197 Diarrhea, unspecified: Secondary | ICD-10-CM | POA: Diagnosis not present

## 2018-11-18 DIAGNOSIS — R634 Abnormal weight loss: Secondary | ICD-10-CM | POA: Diagnosis not present

## 2018-11-18 DIAGNOSIS — R630 Anorexia: Secondary | ICD-10-CM | POA: Diagnosis not present

## 2018-11-18 DIAGNOSIS — K52831 Collagenous colitis: Secondary | ICD-10-CM | POA: Diagnosis not present

## 2018-11-18 DIAGNOSIS — F329 Major depressive disorder, single episode, unspecified: Secondary | ICD-10-CM | POA: Diagnosis not present

## 2018-11-18 DIAGNOSIS — G1221 Amyotrophic lateral sclerosis: Secondary | ICD-10-CM | POA: Diagnosis not present

## 2018-12-15 ENCOUNTER — Ambulatory Visit (INDEPENDENT_AMBULATORY_CARE_PROVIDER_SITE_OTHER): Payer: Medicare Other | Admitting: Psychiatry

## 2018-12-15 DIAGNOSIS — F321 Major depressive disorder, single episode, moderate: Secondary | ICD-10-CM

## 2018-12-15 NOTE — Progress Notes (Signed)
[facility] Counselor Initial Adult Exam  Name: Lori Alvarez Date: 12/15/2018 MRN: [id number] DOB: 05-05-1961 PCP: Shirline Frees, MD  Time spent:  60 minutes   Guardian/Payee:  patient  Paperwork requested:  No   Reason for Visit /Presenting Problem: referred due to depression re: health concerns (ALS) December 23, 2017 .  Appts need to be in afternoon due to weakness.    Depressed, Anxious (fluctuates and rates herself as a "5"  on depression and anxiety)  Mental Status Exam:   Appearance:   Casual     Behavior:  Appropriate and Sharing  Motor:  Normal  Speech/Language:   Clear and Coherent  Affect:  Congruent  Mood:  anxious  Thought process:  normal  Thought content:    WNL  Sensory/Perceptual disturbances:    WNL  Orientation:  oriented to person, place, time/date, situation, day of week, month of year and year  Attention:  Good  Concentration:  Good  Memory:  WNL  Fund of knowledge:   Good  Insight:    Good  Judgment:   Good  Impulse Control:  Good   Reported Symptoms:    Risk Assessment: Danger to Self:  No Self-injurious Behavior: No Danger to Others: No Duty to Warn:no Physical Aggression / Violence:No  Access to Firearms a concern: No  Gang Involvement:No  Patient / guardian was educated about steps to take if suicide or homicide risk level increases between visits:   Denies SI nor HI. While future psychiatric events cannot be accurately predicted, the patient does not currently require acute inpatient psychiatric care and does not currently meet [facility] involuntary commitment criteria.  Substance Abuse History: Current substance abuse: No     Past Psychiatric History:   No previous psychological problems have been observed Outpatient Providers: History of Psych Hospitalization: No  Psychological Testing: N/A   Abuse History: Victim of No., none   Report needed: No. Victim of Neglect:No. Perpetrator of none  Witness / Exposure to Domestic  Violence: No   Protective Services Involvement: No  Witness to [employer] Violence:  No   Family History:  Family History  Problem Relation Age of Onset  . Myopathy Mother   . Cancer Father        renal    Living situation: the patient lives with their spouse  Sexual Orientation:  Straight  Relationship Status: married  Name of spouse / other:             If a parent, number of children- no kids  Support Systems;  Husband, best friend Mariann Laster, close friends  Financial Stress:  No   Income/Employment/Disability: retirement and husband's income  Armed forces logistics/support/administrative officer: No   Educational History: Education: Scientist, product/process development:   [employer]  Any cultural differences that may affect / interfere with treatment:  not applicable  Recreation/Hobbies: reading, scrapbooking, crafts  Stressors:  Dx of ALS last March 2019 (husband is her caregiver and he also has some health issues)  Strengths:  Church and faith, prayer, Darrick Meigs friends  Barriers:   health issues  Legal History: Pending legal issue / charges: The patient has no significant history of legal issues. History of legal issue / charges: none  Medical History/Surgical History:Reviewed with patient who reports she is no longer taking Premarin, Zantac, Zinc, Magnesium. Past Medical History:  Diagnosis Date  . Postherpetic neuralgia     Past Surgical History:  Procedure Laterality Date  . APPENDECTOMY    . PARTIAL HYSTERECTOMY  Medications: Current Outpatient Medications  Medication Sig Dispense Refill  . ALPRAZolam (XANAX) 0.5 MG tablet Take 0.5 mg by mouth as needed for anxiety.    . Black Cohosh (REMIFEMIN) 20 MG TABS Take 20 mg by mouth 2 (two) times daily.    Marland Kitchen CALCIUM-VITAMIN D PO Take 1 tablet by mouth daily. 600-800    . cetirizine (ZYRTEC) 10 MG tablet Take 10 mg by mouth 2 (two) times daily.    . Cyanocobalamin (VITAMIN B-12 PO) Take 500 mcg by mouth daily.    Marland Kitchen  estrogens, conjugated, (PREMARIN) 0.9 MG tablet Take 0.9 mg by mouth daily. Take daily for 21 days then do not take for 7 days.    Marland Kitchen MAGNESIUM PO Take 250 mg by mouth 2 (two) times daily.    . meloxicam (MOBIC) 15 MG tablet Take 15 mg by mouth daily.    . Multiple Vitamins-Minerals (MULTIVITAMIN ADULTS PO) Take 1 tablet by mouth daily.    . ranitidine (ZANTAC) 150 MG tablet Take 150 mg by mouth 2 (two) times daily.    . risedronate (ACTONEL) 35 MG tablet Take 35 mg by mouth every 7 (seven) days. with water on empty stomach, nothing by mouth or lie down for next 30 minutes.    Marland Kitchen VITAMIN E PO Take 400 Units by mouth daily.    . Zinc 50 MG TABS Take 50 mg by mouth daily.     No current facility-administered medications for this visit.     Allergies  Allergen Reactions  . Allegra [Fexofenadine]   . Clotrim-Undecyl-Tea Tr-Lav Oil   . Doxepin   . Nitroglycerin     Migraine  . Nitrofuran Derivatives Rash  . Sulfa Antibiotics Rash    Diagnoses:  F32.1   Moderate major depression, single episode ([place])  Plan of Care:  Patient is wanting coping skills for dealing with emotional aspect of her ALS and be able to talk through these things with someone other than a friend.  (Watched an aunt with ALS suffer and lose her life)   Shanon Ace, LCSW

## 2018-12-29 ENCOUNTER — Ambulatory Visit (INDEPENDENT_AMBULATORY_CARE_PROVIDER_SITE_OTHER): Payer: Medicare Other | Admitting: Psychiatry

## 2018-12-29 ENCOUNTER — Other Ambulatory Visit: Payer: Self-pay

## 2018-12-29 DIAGNOSIS — F321 Major depressive disorder, single episode, moderate: Secondary | ICD-10-CM

## 2018-12-29 NOTE — Progress Notes (Signed)
[facility] Counselor Initial Adult Exam  Name: Lori Alvarez Date: 12/29/2018 MRN: [id number] DOB: 09/26/1961 PCP: Shirline Frees, MD  Time spent:  60 minutes   Guardian/Payee:  patient  Paperwork requested:  No   Reason for Visit /Presenting Problem: referred due to depression re: health concerns (ALS) December 23, 2017 .  Appts need to be in afternoon due to weakness.    Depressed, Anxious (fluctuates and rates herself as a "5"  on depression and anxiety)  Mental Status Exam:   Appearance:   Casual     Behavior:  Appropriate and Sharing  Motor:  Normal  Speech/Language:   Clear and Coherent  Affect:  Congruent  Mood:  anxious, depressed  Thought process:  normal  Thought content:    WNL  Sensory/Perceptual disturbances:    WNL  Orientation:  oriented to person, place, time/date, situation, day of week, month of year and year  Attention:  Good  Concentration:  Good  Memory:  WNL  Fund of knowledge:   Good  Insight:    Good  Judgment:   Good  Impulse Control:  Good   Reported Symptoms:    Risk Assessment: Danger to Self:  No Self-injurious Behavior: No Danger to Others: No Duty to Warn:no Physical Aggression / Violence:No  Access to Firearms a concern: No  Gang Involvement:No  Patient / guardian was educated about steps to take if suicide or homicide risk level increases between visits:   Denies SI nor HI. While future psychiatric events cannot be accurately predicted, the patient does not currently require acute inpatient psychiatric care and does not currently meet [facility] involuntary commitment criteria.  Substance Abuse History: Current substance abuse: No     Past Psychiatric History:   No previous psychological problems have been observed Outpatient Providers: History of Psych Hospitalization: No  Psychological Testing: N/A   Abuse History: Victim of No., none   Report needed: No. Victim of Neglect:No. Perpetrator of none  Witness / Exposure  to Domestic Violence: No   Protective Services Involvement: No  Witness to [employer] Violence:  No   Family History:  Family History  Problem Relation Age of Onset  . Myopathy Mother   . Cancer Father        renal    Living situation: the patient lives with their spouse  Sexual Orientation:  Straight  Relationship Status: married  Name of spouse / other:             If a parent, number of children- no kids  Support Systems;  Husband, best friend Mariann Laster, close friends  Financial Stress:  No   Income/Employment/Disability: retirement and husband's income  Armed forces logistics/support/administrative officer: No   Educational History: Education: Scientist, product/process development:   Protestant  Any cultural differences that may affect / interfere with treatment:  not applicable  Recreation/Hobbies: reading, scrapbooking, crafts  Stressors:  Dx of ALS last March 2019 (husband is her caregiver and he also has some health issues)  Strengths:  Church and faith, prayer, Darrick Meigs friends  Barriers:   health issues  Legal History: Pending legal issue / charges: The patient has no significant history of legal issues. History of legal issue / charges: none  Medical History/Surgical History:Reviewed with patient who reports she is no longer taking Premarin, Zantac, Zinc, Magnesium. Past Medical History:  Diagnosis Date  . Postherpetic neuralgia     Past Surgical History:  Procedure Laterality Date  . APPENDECTOMY    . PARTIAL HYSTERECTOMY  Medications: Current Outpatient Medications  Medication Sig Dispense Refill  . ALPRAZolam (XANAX) 0.5 MG tablet Take 0.5 mg by mouth as needed for anxiety.    . Black Cohosh (REMIFEMIN) 20 MG TABS Take 20 mg by mouth 2 (two) times daily.    Marland Kitchen CALCIUM-VITAMIN D PO Take 1 tablet by mouth daily. 600-800    . cetirizine (ZYRTEC) 10 MG tablet Take 10 mg by mouth 2 (two) times daily.    . Cyanocobalamin (VITAMIN B-12 PO) Take 500 mcg by mouth daily.     Marland Kitchen estrogens, conjugated, (PREMARIN) 0.9 MG tablet Take 0.9 mg by mouth daily. Take daily for 21 days then do not take for 7 days.    Marland Kitchen MAGNESIUM PO Take 250 mg by mouth 2 (two) times daily.    . meloxicam (MOBIC) 15 MG tablet Take 15 mg by mouth daily.    . Multiple Vitamins-Minerals (MULTIVITAMIN ADULTS PO) Take 1 tablet by mouth daily.    . ranitidine (ZANTAC) 150 MG tablet Take 150 mg by mouth 2 (two) times daily.    . risedronate (ACTONEL) 35 MG tablet Take 35 mg by mouth every 7 (seven) days. with water on empty stomach, nothing by mouth or lie down for next 30 minutes.    Marland Kitchen VITAMIN E PO Take 400 Units by mouth daily.    . Zinc 50 MG TABS Take 50 mg by mouth daily.     No current facility-administered medications for this visit.     Allergies  Allergen Reactions  . Allegra [Fexofenadine]   . Clotrim-Undecyl-Tea Tr-Lav Oil   . Doxepin   . Nitroglycerin     Migraine  . Nitrofuran Derivatives Rash  . Sulfa Antibiotics Rash    Diagnoses:  F32.1   Moderate major depression, single episode ([place])  Plan of Care:  Patient is wanting coping skills for dealing with emotional aspect of her ALS and be able to talk through these things with someone other than a friend.  (Watched an aunt with ALS suffer and lose her life)   Shanon Ace, LCSW

## 2018-12-29 NOTE — Progress Notes (Signed)
      [facility] Counselor/Therapist Progress Note  Patient ID: Lori Alvarez, MRN: [id number],    Date: 12/29/2018  Time Spent: 60 minutes  Treatment Type: Individual Therapy  Reported Symptoms:  Anxiety, depression  Mental Status Exam:  Appearance:   Casual     Behavior:  Appropriate and Sharing  Motor:  Normal  Speech/Language:   Clear and Coherent  Affect:  Congruent  Mood:  anxious and depressed  Thought process:  normal  Thought content:    WNL  Sensory/Perceptual disturbances:    WNL  Orientation:  oriented to person, place, time/date, situation, day of week, month of year and year  Attention:  Good  Concentration:  Good  Memory:  WNL  Fund of knowledge:   Good  Insight:    Good  Judgment:   Good  Impulse Control:  Good   Risk Assessment: Danger to Self:  No Self-injurious Behavior: No Danger to Others: No Duty to Warn:no Physical Aggression / Violence:No  Access to Firearms a concern: No  Gang Involvement:No   Subjective:  Patient in today feeling anxious and depressed re: physical diagnosis and symptoms. Processed concerns from her medical visits per her ALS diagnosis.  Processed in detail about prior bad experience on a medical visit.  Lots of emotions expressed and she eventually seemed to be more at peace with it.  Also was able to get a change made in her treatment so that helped a lot for that particular issue.    Worked on helpful strategies for her anxiety, fears, and depression which seemed to add to her self-confidence.  Talked about the different supports she has in her life which is a positive for her.  Will see again within 2 wks.  Patient requests privacy on some of her information that is not specifically stated in note.      Interventions: Cognitive Behavioral Therapy and Ego-Supportive, Solution-focused  Diagnosis:   ICD-10-CM   1. Moderate major depression, single episode ([place]) F32.1     Plan:  Patient to follow through on  strategies mentioned above (primarily CBT and solution-focused).  Will see in 1-2 wks.  Shanon Ace, LCSW

## 2019-01-13 ENCOUNTER — Ambulatory Visit: Payer: Medicare Other | Admitting: Psychiatry

## 2019-01-20 ENCOUNTER — Ambulatory Visit (INDEPENDENT_AMBULATORY_CARE_PROVIDER_SITE_OTHER): Payer: Medicare Other | Admitting: Psychiatry

## 2019-01-20 DIAGNOSIS — F321 Major depressive disorder, single episode, moderate: Secondary | ICD-10-CM | POA: Diagnosis not present

## 2019-01-20 NOTE — Progress Notes (Signed)
[facility] Counselor/Therapist Progress Note  Patient ID: Lori Alvarez, MRN: [id number],    Date: 01/20/2019  Time Spent: 60 minutes 1:08pm to 2:08pm  Treatment Type: Individual Therapy    Virtual Visit via Telephone Note; * Patient is an ALS patient and due to extensive progressive muscle weakness in hands and arms and significant pain, patient is not able to do the video component today.  This was concerning to patient due to concerns of whether Medicare would cover her today but felt she really needed the contact due to her health situation declining. I connected with patient by a video enabled telemedicine application or telephone, with their informed consent, and verified patient privacy and that I am speaking with the correct person using two identifiers.  I am at [place] and patient is at her home.    I discussed the limitations, risks, security and privacy concerns of performing psychotherapy and management service by telephone and the availability of in person appointments. I also discussed with the patient that there may be a patient responsible charge related to this service. The patient expressed understanding and agreed to proceed.  I discussed the treatment planning with the patient. The patient was provided an opportunity to ask questions and all were answered. The patient agreed with the plan and demonstrated an understanding of the instructions.   The patient was advised to call  our office if  symptoms worsen or feel they are in a crisis state and need immediate contact.   Reported Symptoms:  Anxiety, depression  Mental Status Exam:  Appearance:      n/a  Behavior:  Sharing  Motor:  Normal  Speech/Language:   Clear and Coherent  Affect:  n/a  Mood:  anxious and depressed  Thought process:  normal  Thought content:    WNL  Sensory/Perceptual disturbances:    WNL  Orientation:  oriented to person, place, time/date, situation, day of week,  month of year and year  Attention:  Good  Concentration:  Good  Memory:  WNL  Fund of knowledge:   Good  Insight:    Good  Judgment:   Good  Impulse Control:  Good   Risk Assessment: Danger to Self:  No Self-injurious Behavior: No Danger to Others: No Duty to Warn:no Physical Aggression / Violence:No  Access to Firearms a concern: No  Gang Involvement:No   Subjective:  Patient today feeling more anxious and depressed after having a rough few days most recently.  Anxiety, depression, fears are more prominent due to her current physical condition and the coronavirus concerns. Her ALS is progressing with more muscle loss and less that she can do without assistance from others.  Discouraging and frightful for patient and she states that she so values being able to talk this out freely in sessions.  She does have a strong faith which helps support her along with a few close friends, her husband, and their minister.  Tearful and anxious and does feel confident in her medical care through [facility] in [place], [place].  She was able to express some sadness and discuss openly a range of concerns and uncertainties about her situation. Denies and SI.    Worked on strategies (especially CBT, self-care, breathing exercises, meditation) for her anxiety, fears, and depression which seemed to also add to her self-confidence.  Talked about the different supports she has in her life which is a positive for her.  Next session within 2 wks.  (Patient requests  privacy on some of her information that is not specifically stated in note.)  Interventions: Cognitive Behavioral Therapy and Ego-Supportive, Solution-focused  Diagnosis:   ICD-10-CM   1. Moderate major depression, single episode ([place]) F32.1     Plan:  Patient to follow through on strategies mentioned above (primarily CBT and solution-focused).  Next appt in 1-2 wks.  Shanon Ace, LCSW

## 2019-02-03 ENCOUNTER — Ambulatory Visit (INDEPENDENT_AMBULATORY_CARE_PROVIDER_SITE_OTHER): Payer: Medicare Other | Admitting: Psychiatry

## 2019-02-03 ENCOUNTER — Other Ambulatory Visit: Payer: Self-pay

## 2019-02-03 DIAGNOSIS — F321 Major depressive disorder, single episode, moderate: Secondary | ICD-10-CM

## 2019-02-03 NOTE — Progress Notes (Signed)
[facility] Counselor/Therapist Progress Note  Patient ID: Lori Alvarez, MRN: [id number],    Date: 02/03/2019  Time Spent: 68 minutes  3:00pm to 4:08pm  Treatment Type: Individual Therapy    Virtual Visit via Telephone Note; * Patient is an ALS patient and due to extensive progressive muscle weakness in hands and arms and significant pain, patient is not able to do the video component today.  This was concerning to patient due to concerns of whether Medicare would cover her today but felt she really needed the contact due to her health situation declining. I connected with patient by a video enabled telemedicine application or telephone, with their informed consent, and verified patient privacy and that I am speaking with the correct person using two identifiers.  I am at [place] and patient is at her home.    I discussed the limitations, risks, security and privacy concerns of performing psychotherapy and management service by telephone and the availability of in person appointments. I also discussed with the patient that there may be a patient responsible charge related to this service. The patient expressed understanding and agreed to proceed.  I discussed the treatment planning with the patient. The patient was provided an opportunity to ask questions and all were answered. The patient agreed with the plan and demonstrated an understanding of the instructions.   The patient was advised to call  our office if  symptoms worsen or feel they are in a crisis state and need immediate contact.   Reported Symptoms:  Anxiety, depression  Mental Status Exam:  Appearance:      n/a  Behavior:  Sharing  Motor:  Normal  Speech/Language:   Clear and Coherent  Affect:  n/a  Mood:  anxious and depressed  Thought process:  normal  Thought content:    WNL  Sensory/Perceptual disturbances:    WNL  Orientation:  oriented to person, place, time/date, situation, day of week,  month of year and year  Attention:  Good  Concentration:  Good  Memory:  WNL  Fund of knowledge:   Good  Insight:    Good  Judgment:   Good  Impulse Control:  Good   Risk Assessment: Danger to Self:  No Self-injurious Behavior: No Danger to Others: No Duty to Warn:no Physical Aggression / Violence:No  Access to Firearms a concern: No  Gang Involvement:No   Subjective:  Patient today reports being quite down with her ALS situation as the muscle weakness is continuing to worsen.  Fearful and apprehensive.  Lots of weakness and pain physically.  Anxiety and depression remain at a higher level but denies any SI.    Significant issues with her sister and this is complicating her "support system" right now.  Patient really struggles with some family issues that we discussed today for most of session as these issues are very sad for her.  Tried to help her balance things more in efforts for her to manage these family difficulties in ways that don't absorb so much of her energy, and also for her to have support in setting necessary boundaries. Reviewed strategies for helping manage her anxiety, fears, and depression including good self-care, meditation, breathing exercises, and re-framing of thoughts.  (Patient requests privacy on some of her information that is not specifically stated in note.)   Interventions: Cognitive Behavioral Therapy and Ego-Supportive, Solution-focused  Diagnosis:   ICD-10-CM   1. Moderate major depression, single episode ([place]) F32.1     Plan:  Patient to follow through on strategies mentioned above and Next appt is in 2 wks.  Shanon Ace, LCSW

## 2019-02-17 ENCOUNTER — Ambulatory Visit (INDEPENDENT_AMBULATORY_CARE_PROVIDER_SITE_OTHER): Payer: Medicare Other | Admitting: Psychiatry

## 2019-02-17 ENCOUNTER — Other Ambulatory Visit: Payer: Self-pay

## 2019-02-17 DIAGNOSIS — F321 Major depressive disorder, single episode, moderate: Secondary | ICD-10-CM

## 2019-02-17 NOTE — Progress Notes (Signed)
[facility] Counselor/Therapist Progress Note  Patient ID: Lori Alvarez, MRN: [id number],    Date: 02/17/2019  Time Spent: 68 minutes  2:00pm to 3:00pm  Treatment Type: Individual Therapy    Virtual Visit Note;  * Patient is an ALS patient and due to extensive progressive muscle weakness in hands and arms and significant pain, patient is not able to do the video component.  This was concerning to patient due to concerns of whether Medicare would cover her today but felt she really needed the contact due to her health situation declining. **We can get a doctor's note to request Medicare coverage for this patient's telehealth visits as she is experiencing significant emotional and increasing physical concerns. I connected with patient by a video enabled telemedicine application or telephone, with their informed consent, and verified patient privacy and that I am speaking with the correct person using two identifiers.  I am at [place] and patient is at her home.    I discussed the limitations, risks, security and privacy concerns of performing psychotherapy and management service by telephone and the availability of in person appointments. I also discussed with the patient that there may be a patient responsible charge related to this service. The patient expressed understanding and agreed to proceed.  I discussed the treatment planning with the patient. The patient was provided an opportunity to ask questions and all were answered. The patient agreed with the plan and demonstrated an understanding of the instructions.   The patient was advised to call  our office if  symptoms worsen or feel they are in a crisis state and need immediate contact.   Reported Symptoms:  Anxiety, depression  Mental Status Exam:  Appearance:      n/a  Behavior:  Sharing  Motor:  Normal  Speech/Language:   Clear and Coherent  Affect:  n/a  Mood:  anxious and depressed  Thought process:   normal  Thought content:    WNL  Sensory/Perceptual disturbances:    WNL  Orientation:  oriented to person, place, time/date, situation, day of week, month of year and year  Attention:  Good  Concentration:  Good  Memory:  WNL  Fund of knowledge:   Good  Insight:    Good  Judgment:   Good  Impulse Control:  Good   Risk Assessment: Danger to Self:  No Self-injurious Behavior: No Danger to Others: No Duty to Warn:no Physical Aggression / Violence:No  Access to Firearms a concern: No  Gang Involvement:No   Subjective:  Patient continues to be on her medications as she struggles with depression and anxiety. Is having a "down" day today with extreme muscle weakness and pain.  Continues to have fearfulness and apprehension about her quickly declining ALS situation. Reports her depression and some anxiety to be at a higher level.  Denies any SI but she states "I'm ok with when God does decide to take me, but would not harm myself."   Considering talking with her family doctor about Hospice and the different resources they can help provide.  "I just get so weary at times and I get concerned about my husband's ability to do everything needed." (husband has health issues as well). Processed concerns for family members and her fears as she realizes her ALS is progressing. Reviewed strategies for helping manage her anxiety, fears, and depression including good self-care, meditation, breathing exercises, and re-framing of thoughts. Has 1 really good friend that is a good source of  support and has some support from a few others. *Sadness, fears, and relies heavily on her faith.    (Patient requests privacy on some of her information that is not specifically stated in note.)    Interventions: Cognitive Behavioral Therapy and Ego-Supportive, Solution-focused  Diagnosis:   ICD-10-CM   1. Moderate major depression, single episode ([place]) F32.1     Plan:  Patient to follow through on strategies  mentioned above and next appt is within 1- 2 wks.  Shanon Ace, LCSW

## 2019-02-22 DIAGNOSIS — G1221 Amyotrophic lateral sclerosis: Secondary | ICD-10-CM | POA: Diagnosis not present

## 2019-02-22 DIAGNOSIS — M25511 Pain in right shoulder: Secondary | ICD-10-CM | POA: Diagnosis not present

## 2019-02-22 DIAGNOSIS — M25512 Pain in left shoulder: Secondary | ICD-10-CM | POA: Diagnosis not present

## 2019-02-22 DIAGNOSIS — F419 Anxiety disorder, unspecified: Secondary | ICD-10-CM | POA: Diagnosis not present

## 2019-02-22 DIAGNOSIS — R630 Anorexia: Secondary | ICD-10-CM | POA: Diagnosis not present

## 2019-03-03 ENCOUNTER — Ambulatory Visit (INDEPENDENT_AMBULATORY_CARE_PROVIDER_SITE_OTHER): Payer: Medicare Other | Admitting: Psychiatry

## 2019-03-03 ENCOUNTER — Other Ambulatory Visit: Payer: Self-pay

## 2019-03-03 ENCOUNTER — Ambulatory Visit: Payer: Medicare Other | Admitting: Psychiatry

## 2019-03-03 DIAGNOSIS — F321 Major depressive disorder, single episode, moderate: Secondary | ICD-10-CM

## 2019-03-03 NOTE — Progress Notes (Signed)
[facility] Counselor/Therapist Progress Note  Patient ID: Lori Alvarez, MRN: [id number],    Date: 03/03/2019  Time Spent: 60 minutes  2:00pm to 3:00pm  Treatment Type: Individual Therapy    Virtual Visit Note;  * Patient is an ALS patient and due to extensive progressive muscle weakness in hands and arms and significant pain, patient is not able to do the video component.  I connected with patient by a video enabled telemedicine application or telephone, with their informed consent, and verified patient privacy and that I am speaking with the correct person using two identifiers.  I am at [place] and patient is at her home.    I discussed the limitations, risks, security and privacy concerns of performing psychotherapy and management service by telephone and the availability of in person appointments. I also discussed with the patient that there may be a patient responsible charge related to this service. The patient expressed understanding and agreed to proceed.  I discussed the treatment planning with the patient. The patient was provided an opportunity to ask questions and all were answered. The patient agreed with the plan and demonstrated an understanding of the instructions.   The patient was advised to call  our office if  symptoms worsen or feel they are in a crisis state and need immediate contact.   Reported Symptoms:  Anxiety, depression  Mental Status Exam:  Appearance:      n/a  Behavior:  Sharing  Motor:  Normal  Speech/Language:   Clear and Coherent  Affect:  n/a  Mood:  anxious and depressed  Thought process:  normal  Thought content:    WNL  Sensory/Perceptual disturbances:    WNL  Orientation:  oriented to person, place, time/date, situation, day of week, month of year and year  Attention:  Good  Concentration:  Good  Memory:  WNL  Fund of knowledge:   Good  Insight:    Good  Judgment:   Good  Impulse Control:  Good   Risk  Assessment: Danger to Self:  No Self-injurious Behavior: No Danger to Others: No Duty to Warn:no Physical Aggression / Violence:No  Access to Firearms a concern: No  Gang Involvement:No   Subjective:  Patient continues to experience anxiety and depression. Really working hard to cope with her ALS.  Mood fluctuates sometimes based on her ALS symptoms and the severity.  Her faith is extremely important to her and is truly part of her coping skills. Processed more today re: relationship with sister, who did recently show up to help some at patient's home. Discussed what may be helpful in their relationship as there has been issues in past.  Had visit recently with PCP Samara Snide.  Patient and husband may eventually speak with neurologist about hospice and some of the assistance available through them.  Some tasks are getting more difficult for patient and husband.  Focused on the things that help patient feel supported especially when she is feeling more "down".  Denies any SI. Is scary to know her ALS is progressing and some of the symptoms she may likely have in future.  Talks through this openly and welcomes the support, feedback, the weaving of her faith into the support and coping process.     Reviewed strategies for helping manage her anxiety, fears, and depression including good self-care, meditation, breathing exercises, and re-framing of thoughts. Has 1 really good friend that is a good source of support and has some support from  a few others. *Sadness, fears, and relies heavily on her faith.    (Patient requests privacy on some of her information that is not specifically stated in note.)    Interventions: Cognitive Behavioral Therapy and Ego-Supportive, Solution-focused  Diagnosis:   ICD-10-CM   1. Moderate major depression, single episode ([place]) F32.1     Plan:  Patient to follow through on strategies mentioned above and next appt is within 2 wks.  Shanon Ace,  LCSW

## 2019-03-20 ENCOUNTER — Other Ambulatory Visit: Payer: Self-pay

## 2019-03-20 ENCOUNTER — Ambulatory Visit: Payer: Medicare Other | Admitting: Psychiatry

## 2019-04-10 ENCOUNTER — Other Ambulatory Visit: Payer: Self-pay

## 2019-04-10 ENCOUNTER — Ambulatory Visit (INDEPENDENT_AMBULATORY_CARE_PROVIDER_SITE_OTHER): Payer: Medicare Other | Admitting: Psychiatry

## 2019-04-10 DIAGNOSIS — F321 Major depressive disorder, single episode, moderate: Secondary | ICD-10-CM | POA: Diagnosis not present

## 2019-04-10 NOTE — Progress Notes (Signed)
[facility] Counselor/Therapist Progress Note  Patient ID: Lori Alvarez, MRN: [id number],    Date: 04/10/2019  Time Spent: 60 minutes  4:00pm to 5:00pm  Treatment Type: Individual Therapy    Virtual Visit Note; I connected with patient by a video enabled telemedicine application or telephone, with their informed consent, and verified patient privacy and that I am speaking with the correct person using two identifiers.  I am at [place] and patient is at her home.    I discussed the limitations, risks, security and privacy concerns of performing psychotherapy and management service by telephone and the availability of in person appointments. I also discussed with the patient that there may be a patient responsible charge related to this service. The patient expressed understanding and agreed to proceed.  I discussed the treatment planning with the patient. The patient was provided an opportunity to ask questions and all were answered. The patient agreed with the plan and demonstrated an understanding of the instructions.   The patient was advised to call  our office if  symptoms worsen or feel they are in a crisis state and need immediate contact.   Reported Symptoms:  Anxiety, depression  Mental Status Exam:  Appearance:   N/A   (telehealth)  Behavior:  Sharing  Motor:  Normal  Speech/Language:   Clear and Coherent  Affect:  N/A    (telehealth)  Mood:  anxious and depressed  Thought process:  normal  Thought content:    WNL  Sensory/Perceptual disturbances:    WNL  Orientation:  oriented to person, place, time/date, situation, day of week, month of year and year  Attention:  Good  Concentration:  Good  Memory:  WNL  Fund of knowledge:   Good  Insight:    Good  Judgment:   Good  Impulse Control:  Good   Risk Assessment: Danger to Self:  No Self-injurious Behavior: No Danger to Others: No Duty to Warn:no Physical Aggression / Violence:No  Access  to Firearms a concern: No  Gang Involvement:No   Subjective:   Patient reports symptoms of anxiety and depression, mostly due to her ALS diagnosis and her ongoing challenges of managing the effects of this disease on her body and mind.  Mood fluctuates sometimes based on her ALS symptoms and the severity.  Her faith remains extremely important to her and is an integral part of her coping skills. Today is managing the physical and emotional aspects of the progression of her ALS. Shares that she and husband may eventually speak with neurologist about hospice and some of the assistance available through them.  Some tasks are getting more difficult for patient and husband.  Focused on the things that help patient feel supported especially when she is feeling more "down".  Denies any SI. Has had a couple good outings with her best friend riding around and stopping at [employer].  Admits it's scary to know her ALS is progressing and some of the symptoms she may likely have in future.  Talks through this openly and welcomes the support, feedback, the inclusion of her faith into the support and coping process.  Shares that she tries to think about "some things that are not about how I may continue to decline with my ALS" and has practiced this more recently.   Reviewed strategies for helping manage her anxiety, fears, and depression including good self-care, meditation, breathing exercises, and re-framing of thoughts. Has 1 really good friend that is a  good source of support and has some support from a few others. *Sadness, fears, and relies heavily on her faith.    (Patient requests privacy on some of her information that is not specifically stated in note.)    Interventions: Cognitive Behavioral Therapy and Ego-Supportive, Solution-focused  Diagnosis:   ICD-10-CM   1. 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  Shanon Ace, LCSW

## 2019-04-19 DIAGNOSIS — G1221 Amyotrophic lateral sclerosis: Secondary | ICD-10-CM | POA: Diagnosis not present

## 2019-04-19 DIAGNOSIS — R531 Weakness: Secondary | ICD-10-CM | POA: Diagnosis not present

## 2019-05-04 ENCOUNTER — Ambulatory Visit (INDEPENDENT_AMBULATORY_CARE_PROVIDER_SITE_OTHER): Payer: Medicare Other | Admitting: Psychiatry

## 2019-05-04 DIAGNOSIS — F321 Major depressive disorder, single episode, moderate: Secondary | ICD-10-CM | POA: Diagnosis not present

## 2019-05-04 NOTE — Progress Notes (Signed)
[facility] Counselor/Therapist Progress Note  Patient ID: Lori Alvarez, MRN: [id number],    Date: 05/04/2019  Time Spent: 60 minutes    2:30pm to 3:30pm  Treatment Type: Individual Therapy    Virtual Visit Note; I connected with patient by a video enabled telemedicine application or telephone, with their informed consent, and verified patient privacy and that I am speaking with the correct person using two identifiers.  I am at [place] and patient is at her home.    I discussed the limitations, risks, security and privacy concerns of performing psychotherapy and management service by telephone and the availability of in person appointments. I also discussed with the patient that there may be a patient responsible charge related to this service. The patient expressed understanding and agreed to proceed.  I discussed the treatment planning with the patient. The patient was provided an opportunity to ask questions and all were answered. The patient agreed with the plan and demonstrated an understanding of the instructions.   The patient was advised to call  our office if  symptoms worsen or feel they are in a crisis state and need immediate contact.   Reported Symptoms:  Anxiety, depression  Mental Status Exam:  Appearance:   N/A   (telehealth)  Behavior:  Sharing  Motor:  Normal  Speech/Language:   Clear and Coherent  Affect:  N/A    (telehealth)  Mood:  anxious and depressed  Thought process:  normal  Thought content:    WNL  Sensory/Perceptual disturbances:    WNL  Orientation:  oriented to person, place, time/date, situation, day of week, month of year and year  Attention:  Good  Concentration:  Good  Memory:  WNL  Fund of knowledge:   Good  Insight:    Good  Judgment:   Good  Impulse Control:  Good   Risk Assessment: Danger to Self:  No Self-injurious Behavior: No Danger to Others: No Duty to Warn:no Physical Aggression / Violence:No   Access to Firearms a concern: No  Gang Involvement:No   Subjective:   Today patient reports continued symptoms of anxiety and depression, mostly due to her ALS diagnosis and her ongoing challenges of managing the effects of this disease on her body and mind. Her anxiety and depression often fluctuates sometimes based on her ALS symptoms and the severity, especially when her pain and weakness continues.  Her faith remains extremely important to her and is an integral part of her coping skills.  Some physical care tasks are getting more difficult for patient and husband and they may reach out and get info on community resources that may be of help to them in future.    Focused on the things that help patient feel supported especially when she is feeling more "down".  Denies any SI. Has good friend that is supportive and helps patient get out at times.  Admits it's scary to know her ALS is progressing and some of the symptoms she may likely have in future.  Processes this more today, very openly and welcomes the support, feedback, the inclusion of her faith into the support and coping process.  Is still trying to think about "some things that are not about how I may continue to decline with my ALS" and has practiced this more recently.   We reviewed strategies for helping manage her anxiety, fears, and depression including good self-care, meditation, breathing exercises, and re-framing of thoughts. Has 1 really good friend that  is a good source of support and has some support from a few others.  (Patient requests privacy on some of her information that is not specifically stated in note.)    Interventions: Cognitive Behavioral Therapy and Ego-Supportive, Solution-focused  Diagnosis:   ICD-10-CM   1. 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  Shanon Ace, LCSW

## 2019-05-25 ENCOUNTER — Other Ambulatory Visit: Payer: Self-pay

## 2019-05-25 ENCOUNTER — Ambulatory Visit (INDEPENDENT_AMBULATORY_CARE_PROVIDER_SITE_OTHER): Payer: Medicare Other | Admitting: Psychiatry

## 2019-05-25 DIAGNOSIS — F321 Major depressive disorder, single episode, moderate: Secondary | ICD-10-CM

## 2019-05-25 NOTE — Progress Notes (Signed)
[facility] Counselor/Therapist Progress Note  Patient ID: Lori Alvarez, MRN: [id number],    Date: 05/25/2019  Time Spent: 60 minutes    2:30pm to 3:30pm  Treatment Type: Individual Therapy    Virtual Visit Note; I connected with patient by a video enabled telemedicine application or telephone, with their informed consent, and verified patient privacy and that I am speaking with the correct person using two identifiers.  I am at [place] and patient is at her home.    I discussed the limitations, risks, security and privacy concerns of performing psychotherapy and management service by telephone and the availability of in person appointments. I also discussed with the patient that there may be a patient responsible charge related to this service. The patient expressed understanding and agreed to proceed.  I discussed the treatment planning with the patient. The patient was provided an opportunity to ask questions and all were answered. The patient agreed with the plan and demonstrated an understanding of the instructions.   The patient was advised to call  our office if  symptoms worsen or feel they are in a crisis state and need immediate contact.   Reported Symptoms:  Anxiety, depression, diagnosed with ALS and her symptoms are increasing   Mental Status Exam:  Appearance:   N/A   (telehealth)  Behavior:  Sharing  Motor:  Normal  Speech/Language:   Clear and Coherent  Affect:  N/A    (telehealth)  Mood:  anxious and depressed  Thought process:  normal  Thought content:    WNL  Sensory/Perceptual disturbances:    WNL  Orientation:  oriented to person, place, time/date, situation, day of week, month of year and year  Attention:  Good  Concentration:  Good  Memory:  WNL  Fund of knowledge:   Good  Insight:    Good  Judgment:   Good  Impulse Control:  Good   Risk Assessment: Danger to Self:  No Self-injurious Behavior: No Danger to Others: No Duty  to Warn:no Physical Aggression / Violence:No  Access to Firearms a concern: No  Gang Involvement:No   Subjective:   Patient reports continued anxiety and depression, as her ALS symptoms impact her daily life in so many ways.  Is concerned about her ALS as she senses her right arm strength is lessening.  Very discouraging as this "makes me feel more helpless and concerned."  Some twitching sensations in that arm and may email her neurologist, which I supported her in doing.  "It's really hard waking up and keep my spirits up because everything thing I do takes more energy than I've got and it wears me out."  Does have good friend who stops by weekly.  Is also a good self-advocate for anything she needs.  Her anxiety and depression often fluctuates sometimes based on her ALS symptoms and the severity, especially as her pain and weakness continues.  Her faith remains extremely important to her and is an integral part of her coping skills.  Physical care tasks are getting more difficult for patient and husband and they may reach out and get info on community resources that may be of help to them in future.  She is also going to be in touch with her insurance with Medicare about resources covered by them. (not all info detailed in this note, due to patient privacy needs)   Focused again on the things that help patient feel supported especially when she is feeling more "down".  Denies any SI.  Admits again that it's scary to know her ALS is progressing and some of the symptoms she may likely have in future.  Processes this more today, very openly and welcomes the support, feedback, the inclusion of her faith into the support and coping process.  Does try to stay in the present at times and not jump too far ahead in terms of wondering "how things will be" in future. Patient makes very good use of the session times and is very grateful as it allows her to freely vent without judgment nor interruption, as well as  get feedback/input on things that can help her coping.   We reviewed strategies for helping manage her anxiety, fears, and depression including good self-care, meditation, her faith, relaxation exercises, getting out of the house when able, and spending time with closer friends, especially her best friend.     Interventions: Cognitive Behavioral Therapy and Ego-Supportive, Solution-focused  Diagnosis:   ICD-10-CM   1. 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 Goal review and patient is progressing.  Next appt is within 2-3 wks.  Shanon Ace, LCSW

## 2019-06-09 DIAGNOSIS — G8929 Other chronic pain: Secondary | ICD-10-CM | POA: Diagnosis not present

## 2019-06-09 DIAGNOSIS — F5101 Primary insomnia: Secondary | ICD-10-CM | POA: Diagnosis not present

## 2019-06-09 DIAGNOSIS — G1221 Amyotrophic lateral sclerosis: Secondary | ICD-10-CM | POA: Diagnosis not present

## 2019-06-14 DIAGNOSIS — M5020 Other cervical disc displacement, unspecified cervical region: Secondary | ICD-10-CM | POA: Diagnosis not present

## 2019-06-14 DIAGNOSIS — M4802 Spinal stenosis, cervical region: Secondary | ICD-10-CM | POA: Diagnosis not present

## 2019-06-14 DIAGNOSIS — M47812 Spondylosis without myelopathy or radiculopathy, cervical region: Secondary | ICD-10-CM | POA: Diagnosis not present

## 2019-06-14 DIAGNOSIS — G1221 Amyotrophic lateral sclerosis: Secondary | ICD-10-CM | POA: Diagnosis not present

## 2019-06-14 DIAGNOSIS — M5030 Other cervical disc degeneration, unspecified cervical region: Secondary | ICD-10-CM | POA: Diagnosis not present

## 2019-06-14 DIAGNOSIS — M50222 Other cervical disc displacement at C5-C6 level: Secondary | ICD-10-CM | POA: Diagnosis not present

## 2019-06-14 DIAGNOSIS — R202 Paresthesia of skin: Secondary | ICD-10-CM | POA: Diagnosis not present

## 2019-06-14 DIAGNOSIS — R531 Weakness: Secondary | ICD-10-CM | POA: Diagnosis not present

## 2019-06-15 ENCOUNTER — Ambulatory Visit (INDEPENDENT_AMBULATORY_CARE_PROVIDER_SITE_OTHER): Payer: Medicare Other | Admitting: Psychiatry

## 2019-06-15 ENCOUNTER — Other Ambulatory Visit: Payer: Self-pay

## 2019-06-15 DIAGNOSIS — F321 Major depressive disorder, single episode, moderate: Secondary | ICD-10-CM | POA: Diagnosis not present

## 2019-06-15 NOTE — Progress Notes (Signed)
[facility] Counselor/Therapist Progress Note  Patient ID: Lori Alvarez, MRN: [id number],    Date: 06/15/2019  Time Spent: 60 minutes    2:50pm to 3:50pm  Treatment Type: Individual Therapy    Virtual Visit Note;  **This patient is a 58 yr old ALS patient with advancing ALS  symptoms that do not  enable her to physically be able to handle doing her appt with video.  The only way she can have a session is via phone at this time. With her health condition she has been staying home due to her increased risk with the coronavirus.    I connected with patient by a telephone, with their informed consent, and verified patient privacy and that I am speaking with the correct person using two identifiers.  I am at [place] and patient is at her home.    I discussed the limitations, risks, security and privacy concerns of performing psychotherapy and management service by telephone and the availability of in person appointments. I also discussed with the patient that there may be a patient responsible charge related to this service. The patient expressed understanding and agreed to proceed.  I discussed the treatment planning with the patient. The patient was provided an opportunity to ask questions and all were answered. The patient agreed with the plan and demonstrated an understanding of the instructions.   The patient was advised to call  our office if  symptoms worsen or feel they are in a crisis state and need immediate contact.   Reported Symptoms:  Anxiety, depression, diagnosed with ALS and her symptoms continue to increase which is scary for patient   Mental Status Exam:  Appearance:   N/A   (telehealth)  Behavior:  Sharing  Motor:  Normal  Speech/Language:   Clear and Coherent  Affect:  N/A    (telehealth)  Mood:  anxious and depressed (has stated several times she doesn't want more meds for any depression)  Is already on Remeron by 1 of her doctors.  Thought  process:  normal  Thought content:    WNL  Sensory/Perceptual disturbances:    WNL  Orientation:  oriented to person, place, time/date, situation, day of week, month of year and year  Attention:  Good  Concentration:  Good  Memory:  WNL  Fund of knowledge:   Good  Insight:    Good  Judgment:   Good  Impulse Control:  Good   Risk Assessment: Danger to Self:  No Self-injurious Behavior: No Danger to Others: No Duty to Warn:no Physical Aggression / Violence:No  Access to Firearms a concern: No  Gang Involvement:No   Subjective: Patient reports continued anxiety and depression as her ALS symptoms impact her daily life in so many ways.  Is very concerned about how her ALS may progress, as her pain and weakness is already increasing.   This is very discouraging as this "makes me feel more helpless and concerned."  Some twitching sensations in her lips is something new that has started most recently.  Still having them in her arm and has followed up with her doctor on this. Physical activities like taking a shower are becoming more an more difficult for patient and husband and they are checking out other resources for when the time comes that patient wants that type of assistance--states she's not ready for it quite yet.    Does have good friend who stops by weekly.  Patient is a good self-advocate for anything she needs.  She is in frequent contact with her health providers. Her anxiety and depression often fluctuates sometimes based on her ALS symptoms and the severity, especially as her pain and weakness continues.  Her faith remains extremely important to her and is an integral part of her coping skills.   Patient feeling very heard, supported, and validated as she openly talks about her current condition and emotionally trying to cope with her ALS, as it progresses. Supported her in her fears and also supported her in review of strategies and looking at what has helped and what has not.   Some things that help her are: good self-care, meditation, her faith, relaxation exercises, getting out of the house when able, and spending time with closer friends, especially her best friend.     Denies any SI.   Interventions: Cognitive Behavioral Therapy and Ego-Supportive, Solution-focused  Diagnosis:   ICD-10-CM   1. 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 Goal review, per [employer], and patient is progressing.  Next appt is within 2-3 wks.  Shanon Ace, LCSW

## 2019-06-27 DIAGNOSIS — M9903 Segmental and somatic dysfunction of lumbar region: Secondary | ICD-10-CM | POA: Diagnosis not present

## 2019-06-27 DIAGNOSIS — R202 Paresthesia of skin: Secondary | ICD-10-CM | POA: Diagnosis not present

## 2019-06-27 DIAGNOSIS — M5413 Radiculopathy, cervicothoracic region: Secondary | ICD-10-CM | POA: Diagnosis not present

## 2019-06-27 DIAGNOSIS — M545 Low back pain: Secondary | ICD-10-CM | POA: Diagnosis not present

## 2019-06-27 DIAGNOSIS — M9901 Segmental and somatic dysfunction of cervical region: Secondary | ICD-10-CM | POA: Diagnosis not present

## 2019-06-27 DIAGNOSIS — M542 Cervicalgia: Secondary | ICD-10-CM | POA: Diagnosis not present

## 2019-06-27 DIAGNOSIS — M5137 Other intervertebral disc degeneration, lumbosacral region: Secondary | ICD-10-CM | POA: Diagnosis not present

## 2019-07-03 DIAGNOSIS — M9901 Segmental and somatic dysfunction of cervical region: Secondary | ICD-10-CM | POA: Diagnosis not present

## 2019-07-03 DIAGNOSIS — M545 Low back pain: Secondary | ICD-10-CM | POA: Diagnosis not present

## 2019-07-03 DIAGNOSIS — M9903 Segmental and somatic dysfunction of lumbar region: Secondary | ICD-10-CM | POA: Diagnosis not present

## 2019-07-03 DIAGNOSIS — M542 Cervicalgia: Secondary | ICD-10-CM | POA: Diagnosis not present

## 2019-07-03 DIAGNOSIS — M5413 Radiculopathy, cervicothoracic region: Secondary | ICD-10-CM | POA: Diagnosis not present

## 2019-07-03 DIAGNOSIS — R202 Paresthesia of skin: Secondary | ICD-10-CM | POA: Diagnosis not present

## 2019-07-03 DIAGNOSIS — M5137 Other intervertebral disc degeneration, lumbosacral region: Secondary | ICD-10-CM | POA: Diagnosis not present

## 2019-07-06 DIAGNOSIS — M5413 Radiculopathy, cervicothoracic region: Secondary | ICD-10-CM | POA: Diagnosis not present

## 2019-07-06 DIAGNOSIS — M5137 Other intervertebral disc degeneration, lumbosacral region: Secondary | ICD-10-CM | POA: Diagnosis not present

## 2019-07-06 DIAGNOSIS — M545 Low back pain: Secondary | ICD-10-CM | POA: Diagnosis not present

## 2019-07-06 DIAGNOSIS — M542 Cervicalgia: Secondary | ICD-10-CM | POA: Diagnosis not present

## 2019-07-06 DIAGNOSIS — M9901 Segmental and somatic dysfunction of cervical region: Secondary | ICD-10-CM | POA: Diagnosis not present

## 2019-07-06 DIAGNOSIS — M9903 Segmental and somatic dysfunction of lumbar region: Secondary | ICD-10-CM | POA: Diagnosis not present

## 2019-07-06 DIAGNOSIS — R202 Paresthesia of skin: Secondary | ICD-10-CM | POA: Diagnosis not present

## 2019-07-07 ENCOUNTER — Other Ambulatory Visit: Payer: Self-pay

## 2019-07-07 ENCOUNTER — Ambulatory Visit (INDEPENDENT_AMBULATORY_CARE_PROVIDER_SITE_OTHER): Payer: Medicare Other | Admitting: Psychiatry

## 2019-07-07 DIAGNOSIS — F321 Major depressive disorder, single episode, moderate: Secondary | ICD-10-CM

## 2019-07-07 NOTE — Progress Notes (Signed)
[facility] Counselor/Therapist Progress Note  Patient ID: Lori Alvarez, MRN: [id number],    Date: 07/07/2019  Time Spent: 65 minutes       3:00pm to 4:05pm  Virtual Visit  Note:  **Please note that this patient has advancing ALS and she is unable to do a visit with video.  She would not have the privacy she needs to be able to openly talk about her fears and anxieties as she faces a lot of uncertainty in her physical diagnosis. On her behalf, I request that Medicare consider covering her session.   Connected with patient by a video enabled telemedicine/telehealth application or telephone, with their informed consent, and verified patient privacy and that I am speaking with the correct person using two identifiers. I discussed the limitations, risks, security and privacy concerns of performing psychotherapy and management service by telephone and the availability of in person appointments. I also discussed with the patient that there may be a patient responsible charge related to this service. The patient expressed understanding and agreed to proceed. I discussed the treatment planning with the patient. The patient was provided an opportunity to ask questions and all were answered. The patient agreed with the plan and demonstrated an understanding of the instructions. The patient was advised to call  our office if  symptoms worsen or feel they are in a crisis state and need immediate contact.   Therapist Location: [facility] Patient Location: home   Treatment Type: Individual Therapy  Reported Symptoms:  Really discouraged and fearful at times with ALS symptoms increasing. Physical weakness. Depression, anxiety  Mental Status Exam:  Appearance:   n/a  telehealth     Behavior:  Sharing openly  Motor:  n/a    telehealth  Speech/Language:   Normal Rate  Affect:  n/a   telehealth  Mood:  anxious and depressed  Thought process:  normal  Thought content:    WNL   Sensory/Perceptual disturbances:    WNL  Orientation:  oriented to person, place, time/date, situation, day of week, month of year and year  Attention:  Good  Concentration:  Good  Memory:  WNL  Fund of knowledge:   Good  Insight:    Good  Judgment:   Good  Impulse Control:  Good   Risk Assessment: Danger to Self:  No Self-injurious Behavior: No Danger to Others: No Duty to Warn:no Physical Aggression / Violence:No  Access to Firearms a concern: No  Gang Involvement:No   Subjective:   Virtual visit with patient. Anxious, some depression, fears of future due uncertainty and progression of her ALS.  Frustrated at times. Has seen recently a chiropractor who is helping with some back/neck pain and she is feeling some hopefulness about that. Husband helps as able and has a good friend that remains very helpful.  Her faith remains strong and she relies on it also for support and to feel more grounded.    Interventions: Cognitive Behavioral Therapy and Mindfulness Meditation  Diagnosis:   ICD-10-CM   1. 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  Treatment Goals: Goals may remain here in tx plan as patient works on strategies to meet her goals. Some changes made in goals today to better meet patient needs.  Long term goal: Develop the ability to recognize, accept, and cope with feelings of depression.  Short term goal: Learn  and implement calming skills to reduce overall tension and moments of increased depression and anxiety.  Strategies: 1) Identify cognitive self-talk that supports depression.  Work to interrupt those self-talk messages, and eventually replace it with self-talk that patient feels is more supportive to her in managing her ALS symptoms and progression. 2) Encourage patient's open, full expression of her depression and anxiety as she navigates her journey with advancing  ALS.  Progress: Patient making progress in open expression of her emotions, thoughts, and self-talk.  Motivated and to continue working on her self-talk and discovering what is most helpful to her at this time.   Next appt in 3 weeks.   Shanon Ace, LCSW

## 2019-07-11 DIAGNOSIS — M5413 Radiculopathy, cervicothoracic region: Secondary | ICD-10-CM | POA: Diagnosis not present

## 2019-07-11 DIAGNOSIS — M542 Cervicalgia: Secondary | ICD-10-CM | POA: Diagnosis not present

## 2019-07-11 DIAGNOSIS — M545 Low back pain: Secondary | ICD-10-CM | POA: Diagnosis not present

## 2019-07-11 DIAGNOSIS — M9903 Segmental and somatic dysfunction of lumbar region: Secondary | ICD-10-CM | POA: Diagnosis not present

## 2019-07-11 DIAGNOSIS — R202 Paresthesia of skin: Secondary | ICD-10-CM | POA: Diagnosis not present

## 2019-07-11 DIAGNOSIS — M5137 Other intervertebral disc degeneration, lumbosacral region: Secondary | ICD-10-CM | POA: Diagnosis not present

## 2019-07-11 DIAGNOSIS — M9901 Segmental and somatic dysfunction of cervical region: Secondary | ICD-10-CM | POA: Diagnosis not present

## 2019-07-26 DIAGNOSIS — M4802 Spinal stenosis, cervical region: Secondary | ICD-10-CM | POA: Diagnosis not present

## 2019-07-28 ENCOUNTER — Ambulatory Visit (INDEPENDENT_AMBULATORY_CARE_PROVIDER_SITE_OTHER): Payer: Medicare Other | Admitting: Psychiatry

## 2019-07-28 ENCOUNTER — Other Ambulatory Visit: Payer: Self-pay

## 2019-07-28 DIAGNOSIS — F321 Major depressive disorder, single episode, moderate: Secondary | ICD-10-CM | POA: Diagnosis not present

## 2019-07-28 NOTE — Progress Notes (Signed)
[facility] Counselor/Therapist Progress Note  Patient ID: Lori Alvarez, MRN: [id number],    Date: 07/28/2019  Time Spent: 60 minutes   3:00pm to 4:00pm   Virtual Visit  Note **Please note that this patient has advancing ALS and she is unable to do a visit with video.  She would not have the privacy she needs to be able to openly talk about her fears and anxieties as she faces a lot of uncertainty in her physical diagnosis. On patient's behalf, I request that Medicare consider covering her session, without a reduction. Thank you. [place] Connected with patient by a video enabled telemedicine/telehealth application or telephone, with their informed consent, and verified patient privacy and that I am speaking with the correct person using two identifiers. I discussed the limitations, risks, security and privacy concerns of performing psychotherapy and management service by telephone and the availability of in person appointments. I also discussed with the patient that there may be a patient responsible charge related to this service. The patient expressed understanding and agreed to proceed. I discussed the treatment planning with the patient. The patient was provided an opportunity to ask questions and all were answered. The patient agreed with the plan and demonstrated an understanding of the instructions. The patient was advised to call  our office if  symptoms worsen or feel they are in a crisis state and need immediate contact.   Therapist Location: [facility] Patient Location: home   Treatment Type: Individual Therapy  Reported Symptoms: depression, anxiety, trying to cope with advancing ALS  Mental Status Exam:  Appearance:   n/a   telehealth     Behavior:  Appropriate and Sharing  Motor:  n/a   telehealth  Speech/Language:   Normal Rate  Affect:  n/a  telehealth  Mood:  anxious and depressed  Thought process:  goal directed  Thought content:    WNL   Sensory/Perceptual disturbances:    WNL  Orientation:  oriented to person, place, time/date, situation, day of week, month of year and year  Attention:  Good  Concentration:  Good  Memory:  WNL  Fund of knowledge:   Good  Insight:    Good  Judgment:   Good  Impulse Control:  Good   Risk Assessment: Danger to Self:  No Self-injurious Behavior: No Danger to Others: No Duty to Warn:no Physical Aggression / Violence:No  Access to Firearms a concern: No  Gang Involvement:No   Subjective: Patient today reporting anxiousness and depression, some apprehension, and fears and concerns about her advancing ALS disease.   Interventions: Solution-Oriented/Positive Psychology and Ego-Supportive  Diagnosis:   ICD-10-CM   1. Moderate major depression, single episode ([place])  F32.1      Plan:  Patient is not signing tx plan on a computer screen due to [place].  Treatment Goals: Goals may remain here in tx plan as patient works on strategies to meet her goals. Some changes made in goals today to better meet patient needs.  Long term goal: Develop the ability to recognize, accept, and cope with feelings of depression.  Short term goal: Learn and implement calming skills to reduce overall tension and moments of increased depression and anxiety.  Strategies: 1) Identify cognitive self-talk that supports depression.  Work to interrupt those self-talk messages, and eventually replace it with self-talk that patient feels is more supportive to her in managing her ALS symptoms and progression. 2) Encourage patient's open, full expression of her depression and anxiety as  she navigates her journey with advancing ALS.  PROGRESS: Patient has advancing ALS and struggles with anxiety, depression, and trying to cope with her disease. Talked freely of her fears and anxieties of the unknowns and uncertainties she faces.  While her progressing ALS limits her, she is able to speak clearly and verbalize  well.  She does engage in goal-directed communication as today we discussed her self-talk and what helps and what does not help.  Patient acknowledges she has not always been positive, and that she is trying to eliminate her negative messages/beliefs when she is able to catch them in her self-monitoring. Her monitoring is sometimes limited due to physical condition including intense pain, limited movement, and tiring easily. She does try to coach herself along (when able) at those times, and is working on this in terms of her short term goal (see above) of developing some calming skills.   Goal review and progress noted with patient.   Next appt within 3 weeks.   Shanon Ace, LCSW

## 2019-07-31 ENCOUNTER — Encounter: Payer: Self-pay | Admitting: Physical Therapy

## 2019-07-31 ENCOUNTER — Ambulatory Visit: Payer: Medicare Other | Attending: Neurological Surgery | Admitting: Physical Therapy

## 2019-07-31 ENCOUNTER — Other Ambulatory Visit: Payer: Self-pay

## 2019-07-31 DIAGNOSIS — M542 Cervicalgia: Secondary | ICD-10-CM | POA: Insufficient documentation

## 2019-07-31 DIAGNOSIS — M5412 Radiculopathy, cervical region: Secondary | ICD-10-CM

## 2019-07-31 DIAGNOSIS — M6281 Muscle weakness (generalized): Secondary | ICD-10-CM | POA: Diagnosis not present

## 2019-07-31 NOTE — Therapy (Signed)
[address] [place], [place], [address] Phone: [phone]   Fax:  [phone]  Physical Therapy Evaluation  Patient Details  Name: Lori Alvarez MRN: [id number] Date of Birth: [birth year] Referring Provider (PT): Elsner   Encounter Date: 07/31/2019  PT End of Session - 07/31/19 [id number]    Visit Number  1    Date for PT Re-Evaluation  09/30/19    PT Start Time  [id number]    PT Stop Time  [id number]    PT Time Calculation (min)  66 min    Activity Tolerance  Patient tolerated treatment well;Patient limited by pain    Behavior During Therapy  [facility] for tasks assessed/performed;Anxious       Past Medical History:  Diagnosis Date  . Postherpetic neuralgia     Past Surgical History:  Procedure Laterality Date  . APPENDECTOMY    . PARTIAL HYSTERECTOMY      There were no vitals filed for this visit.   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  Her biggest c/o pain is int he neck and the shoulders with pain into the arms.  She does c/o numbness and tingling in the hands left > right.    Pertinent History  has dx of ALS    Limitations  Lifting    Patient Stated Goals  have less pain, less numbness, relief    Currently in Pain?  Yes    Pain Score  10-Worst pain ever    Pain Location  Neck    Pain Descriptors / Indicators  Throbbing;Aching;Constant    Pain Type  Acute pain    Pain Radiating Towards  pain into the arms, numbness into the hands    Pain Onset  More than a month ago    Pain Frequency  Constant    Aggravating Factors   just a constant ache pain a 10/10    Pain Relieving Factors  tylenol, some other meds at best pain is a 5/10    Effect of Pain on Daily Activities  limits everything reports hurts constantly         [facility] PT Assessment - 07/31/19 0001      Assessment   Medical Diagnosis  cervical stenosis    Referring Provider (PT)  Elsner    Onset Date/Surgical Date  07/01/19    Hand Dominance  Right    Prior Therapy  no      Precautions   Precautions  None      Balance Screen   Has the patient fallen in the past 6 months  No    Has the patient had a decrease in activity level because of a fear of falling?   No    Is the patient reluctant to leave their home because of a fear of falling?   No      Home Environment   Additional Comments  lives with husband      Prior Function   Level of Independence  Independent    Vocation  On disability    Leisure  has a bike at home, has not done it much      Posture/Postural Control   Posture Comments  fwd head,       ROM / Strength   AROM / PROM / Strength  AROM;Strength  AROM   Overall AROM Comments  cervical ROM is decreased 50% for all motions except side bending decreased 75% with some increase of pain in the shoulders and the neck    AROM Assessment Site  Shoulder    Right/Left Shoulder  Right;Left    Right Shoulder Flexion  20 Degrees    Right Shoulder ABduction  20 Degrees    Right Shoulder Internal Rotation  --   can reach to pockets   Left Shoulder Flexion  20 Degrees    Left Shoulder ABduction  20 Degrees    Left Shoulder Internal Rotation  --   can reach to pockets     Strength   Overall Strength Comments  shoulders and elbows 2+/5 very limited strength , grip right 15#, left 10#      Palpation   Palpation comment  significant spasms and knots in the upper traps and int the cervical paraspinals, there is a lot of mm wasting of the inerosseus mms of the hands especially the web space, also mm wasting at the shoulder area.                Objective measurements completed on examination: See above findings.      [facility] Adult PT Treatment/Exercise - 07/31/19 0001      Modalities   Modalities  Electrical Stimulation;Moist Heat      Moist Heat Therapy   Number Minutes  Moist Heat  15 Minutes    Moist Heat Location  Cervical      Electrical Stimulation   Electrical Stimulation Location  upper trap and into the cervical area    Electrical Stimulation Action  IFC    Electrical Stimulation Parameters  sitting    Electrical Stimulation Goals  Pain      Manual Therapy   Manual Therapy  Soft tissue mobilization    Soft tissue mobilization  cervical and upper trap area             PT Education - 07/31/19 1640    Education Details  cervical and scapular retraction, shrugs and upper trap and levator stretches    Person(s) Educated  Patient    Methods  Explanation;Demonstration;Handout;Verbal cues    Comprehension  Verbalized understanding       PT Short Term Goals - 07/31/19 1652      PT SHORT TERM GOAL #1   Title  independent with initial HEP    Time  2    Period  Weeks    Status  New        PT Long Term Goals - 07/31/19 1652      PT LONG TERM GOAL #1   Title  report pain decreased 25%    Time  8    Period  Weeks    Status  New      PT LONG TERM GOAL #2   Title  report that she can do her hair on her own    Time  8    Period  Weeks    Status  New      PT LONG TERM GOAL #3   Title  increase right grip strength to 25#    Time  8    Period  Weeks    Status  New      PT LONG TERM GOAL #4   Title  increase cervical ROM 25%    Time  8    Period  Weeks    Status  New  Plan - 07/31/19 1641    Clinical Impression Statement  Patient reports that she has had cervical stenosis for a few years, she has a dx of ALS, so she has some numbness, tingling and weakness of th eUE's.  MRI showed stenosis at C6 and C7 with degenerative changes.  She has significant spasms and knots in the upper traps and the cervical parapsinals.  She has extremeley weak arms and hands, this could be from the ALS.    Personal Factors and Comorbidities  Comorbidity 1    Comorbidities  ALS    Stability/Clinical Decision Making   Evolving/Moderate complexity    Clinical Decision Making  Moderate    Rehab Potential  Fair    PT Frequency  2x / week    PT Duration  8 weeks    PT Treatment/Interventions  ADLs/Self Care Home Management;Cryotherapy;Electrical Stimulation;Moist Heat;Traction;Ultrasound;Therapeutic activities;Therapeutic exercise;Neuromuscular re-education;Manual techniques;Dry needling;Patient/family education;Taping    PT Next Visit Plan  may try some easy exercises, work on the spasms and then will try traction    Consulted and Agree with Plan of Care  Patient       Patient will benefit from skilled therapeutic intervention in order to improve the following deficits and impairments:  Pain, Improper body mechanics, Impaired sensation, Increased muscle spasms, Postural dysfunction, Impaired tone, Impaired UE functional use, Decreased strength, Decreased range of motion, Decreased activity tolerance, Impaired flexibility  Visit Diagnosis: Cervicalgia - Plan: PT plan of care cert/re-cert  Muscle weakness (generalized) - Plan: PT plan of care cert/re-cert  Radiculopathy, cervical region - Plan: PT plan of care cert/re-cert     Problem List Patient Active Problem List   Diagnosis Date Noted  . Collagenous colitis 07/28/2018  . Decreased sensation of lower extremity 07/20/2018  . ALS (amyotrophic lateral sclerosis) ([place]) 12/23/2017  . Muscle weakness (generalized) 11/23/2017  . Bilateral arm weakness 11/11/2017  . Elevated blood pressure reading without diagnosis of hypertension 11/11/2017  . Post herpetic neuralgia 11/11/2017  . MDS (myelodysplastic syndrome) ([place]) 01/22/2015    Sumner Boast., PT 07/31/2019, 4:57 PM  [address] [place], [place], [address] Phone: [phone]   Fax:  [phone]  Name: SOPHRONIA HERRADA MRN: [id number] Date of Birth: 10/10/1961

## 2019-08-02 DIAGNOSIS — G1221 Amyotrophic lateral sclerosis: Secondary | ICD-10-CM | POA: Diagnosis not present

## 2019-08-08 ENCOUNTER — Ambulatory Visit: Payer: Medicare Other | Admitting: Physical Therapy

## 2019-08-08 ENCOUNTER — Other Ambulatory Visit: Payer: Self-pay

## 2019-08-08 ENCOUNTER — Encounter: Payer: Self-pay | Admitting: Physical Therapy

## 2019-08-08 DIAGNOSIS — M5412 Radiculopathy, cervical region: Secondary | ICD-10-CM

## 2019-08-08 DIAGNOSIS — M542 Cervicalgia: Secondary | ICD-10-CM

## 2019-08-08 DIAGNOSIS — M6281 Muscle weakness (generalized): Secondary | ICD-10-CM

## 2019-08-08 NOTE — Therapy (Signed)
[address] [address] [place], [place], [address] Phone: [phone]   Fax:  [phone]  Physical Therapy Treatment  Patient Details  Name: CHELE CORNELL MRN: [id number] Date of Birth: [birth year] Referring Provider (PT): Elsner   Encounter Date: 08/08/2019  PT End of Session - 08/08/19 1522    Visit Number  2    Date for PT Re-Evaluation  09/30/19    PT Start Time  6962    PT Stop Time  9528    PT Time Calculation (min)  54 min    Activity Tolerance  Patient tolerated treatment well;Patient limited by pain;Patient limited by fatigue    Behavior During Therapy  [facility] for tasks assessed/performed;Anxious       Past Medical History:  Diagnosis Date  . Postherpetic neuralgia     Past Surgical History:  Procedure Laterality Date  . APPENDECTOMY    . PARTIAL HYSTERECTOMY      There were no vitals filed for this visit.  Subjective Assessment - 08/08/19 1519    Subjective  Patient reports that she was a little sore after last treatment    Currently in Pain?  Yes    Pain Score  9     Pain Location  Neck    Pain Descriptors / Indicators  Aching                       [facility] Adult PT Treatment/Exercise - 08/08/19 0001      Exercises   Exercises  Neck      Neck Exercises: Machines for Strengthening   UBE (Upper Arm Bike)  level 1 x 2 minutes with assist    Nustep  level 1 x 2 minutes      Neck Exercises: Supine   Other Supine Exercise  yellow tband triceps and rows with light assist to hold band    Other Supine Exercise  feet on ball K2C, trunk rotation, small bridges, isometric abs      Modalities   Modalities  Electrical Stimulation;Moist Heat      Moist Heat Therapy   Number Minutes Moist Heat  15 Minutes    Moist Heat Location  Cervical      Electrical Stimulation   Electrical Stimulation Location  upper trap and into the cervical area    Electrical Stimulation Action  IFC     Electrical Stimulation Parameters  sitting    Electrical Stimulation Goals  Pain      Manual Therapy   Manual Therapy  Soft tissue mobilization    Manual therapy comments  gentle stretches to the upper trap and levator    Soft tissue mobilization  cervical and upper trap area               PT Short Term Goals - 08/08/19 1523      PT SHORT TERM GOAL #1   Title  independent with initial HEP    Status  Partially Met        PT Long Term Goals - 07/31/19 1652      PT LONG TERM GOAL #1   Title  report pain decreased 25%    Time  8    Period  Weeks    Status  New      PT LONG TERM GOAL #2   Title  report that she can do her hair on her own    Time  8  Period  Weeks    Status  New      PT LONG TERM GOAL #3   Title  increase right grip strength to 25#    Time  8    Period  Weeks    Status  New      PT LONG TERM GOAL #4   Title  increase cervical ROM 25%    Time  8    Period  Weeks    Status  New            Plan - 08/08/19 1522    Clinical Impression Statement  Patient reports that she has a lot of difficulty with the exercises due to severe weakness of the hands and arms, for all of the exercises she needed some assist and some gaurding for safety    PT Next Visit Plan  may try some easy exercises, work on the spasms and then will try traction    Consulted and Agree with Plan of Care  Patient       Patient will benefit from skilled therapeutic intervention in order to improve the following deficits and impairments:  Pain, Improper body mechanics, Impaired sensation, Increased muscle spasms, Postural dysfunction, Impaired tone, Impaired UE functional use, Decreased strength, Decreased range of motion, Decreased activity tolerance, Impaired flexibility  Visit Diagnosis: Cervicalgia  Muscle weakness (generalized)  Radiculopathy, cervical region     Problem List Patient Active Problem List   Diagnosis Date Noted  . Collagenous colitis 07/28/2018   . Decreased sensation of lower extremity 07/20/2018  . ALS (amyotrophic lateral sclerosis) ([place]) 12/23/2017  . Muscle weakness (generalized) 11/23/2017  . Bilateral arm weakness 11/11/2017  . Elevated blood pressure reading without diagnosis of hypertension 11/11/2017  . Post herpetic neuralgia 11/11/2017  . MDS (myelodysplastic syndrome) ([place]) 01/22/2015    Sumner Boast., PT 08/08/2019, 3:24 PM  [address] [place], [place], [address] Phone: [phone]   Fax:  [phone]  Name: ANUPAMA PIEHL MRN: [id number] Date of Birth: 06-26-1961

## 2019-08-10 ENCOUNTER — Other Ambulatory Visit: Payer: Self-pay

## 2019-08-10 ENCOUNTER — Ambulatory Visit: Payer: Medicare Other | Admitting: Physical Therapy

## 2019-08-10 ENCOUNTER — Encounter: Payer: Self-pay | Admitting: Physical Therapy

## 2019-08-10 DIAGNOSIS — M6281 Muscle weakness (generalized): Secondary | ICD-10-CM | POA: Diagnosis not present

## 2019-08-10 DIAGNOSIS — M5412 Radiculopathy, cervical region: Secondary | ICD-10-CM

## 2019-08-10 DIAGNOSIS — M542 Cervicalgia: Secondary | ICD-10-CM | POA: Diagnosis not present

## 2019-08-10 NOTE — Therapy (Signed)
[address] [place], [place], [address] Phone: [phone]   Fax:  [phone]  Physical Therapy Treatment  Patient Details  Name: Lori Alvarez MRN: [id number] Date of Birth: 03-Mar-1961 Referring Provider (PT): Elsner   Encounter Date: 08/10/2019  PT End of Session - 08/10/19 1526    Visit Number  3    Date for PT Re-Evaluation  09/30/19    PT Start Time  6269    PT Stop Time  1535    PT Time Calculation (min)  59 min    Activity Tolerance  Patient tolerated treatment well;Patient limited by pain;Patient limited by fatigue    Behavior During Therapy  [facility] for tasks assessed/performed;Anxious       Past Medical History:  Diagnosis Date  . Postherpetic neuralgia     Past Surgical History:  Procedure Laterality Date  . APPENDECTOMY    . PARTIAL HYSTERECTOMY      There were no vitals filed for this visit.  Subjective Assessment - 08/10/19 1524    Subjective  Patient reports soreness that night but then a little better the next day    Currently in Pain?  Yes    Pain Score  8     Pain Location  Neck    Aggravating Factors   the massage and the heat feel good but it does not last                       [facility] Adult PT Treatment/Exercise - 08/10/19 0001      Neck Exercises: Machines for Strengthening   UBE (Upper Arm Bike)  level 1 x 2 minutes with assist    Cybex Row  tried and able to do 6 reps with some help but a lot of cues to decrease the shoulder elevation    Other Machines for Strengthening  leg extension 5# x 12, leg flexion 15# x 12, leg press 20# x 8, no weight x 8      Neck Exercises: Seated   Other Seated Exercise  biceps with some assist      Modalities   Modalities  Electrical Stimulation;Moist Heat      Moist Heat Therapy   Number Minutes Moist Heat  15 Minutes    Moist Heat Location  Cervical      Electrical Stimulation   Electrical Stimulation Location   upper trap and into the cervical area    Electrical Stimulation Action  IFC    Electrical Stimulation Parameters  sitting    Electrical Stimulation Goals  Pain      Manual Therapy   Manual Therapy  Soft tissue mobilization    Manual therapy comments  gentle stretches to the upper trap and levator    Soft tissue mobilization  cervical and upper trap area               PT Short Term Goals - 08/10/19 1528      PT SHORT TERM GOAL #1   Title  independent with initial HEP    Status  Partially Met        PT Long Term Goals - 07/31/19 1652      PT LONG TERM GOAL #1   Title  report pain decreased 25%    Time  8    Period  Weeks    Status  New      PT LONG TERM GOAL #2  Title  report that she can do her hair on her own    Time  8    Period  Weeks    Status  New      PT LONG TERM GOAL #3   Title  increase right grip strength to 25#    Time  8    Period  Weeks    Status  New      PT LONG TERM GOAL #4   Title  increase cervical ROM 25%    Time  8    Period  Weeks    Status  New            Plan - 08/10/19 1527    Clinical Impression Statement  Patient with very weak arms especially left, there fore she is compensating iwth the upper traps and this is increaseing the upper trap spasm and pain.  She has a lot of knots in the upper traps, she needs assist with the arm motions and to decrease the compensation    PT Next Visit Plan  continue to work on mms and decrease compensation    Consulted and Agree with Plan of Care  Patient       Patient will benefit from skilled therapeutic intervention in order to improve the following deficits and impairments:  Pain, Improper body mechanics, Impaired sensation, Increased muscle spasms, Postural dysfunction, Impaired tone, Impaired UE functional use, Decreased strength, Decreased range of motion, Decreased activity tolerance, Impaired flexibility  Visit Diagnosis: Cervicalgia  Muscle weakness  (generalized)  Radiculopathy, cervical region     Problem List Patient Active Problem List   Diagnosis Date Noted  . Collagenous colitis 07/28/2018  . Decreased sensation of lower extremity 07/20/2018  . ALS (amyotrophic lateral sclerosis) ([place]) 12/23/2017  . Muscle weakness (generalized) 11/23/2017  . Bilateral arm weakness 11/11/2017  . Elevated blood pressure reading without diagnosis of hypertension 11/11/2017  . Post herpetic neuralgia 11/11/2017  . MDS (myelodysplastic syndrome) ([place]) 01/22/2015    Sumner Boast., PT 08/10/2019, 4:18 PM  [address] [place], [place], [address] Phone: [phone]   Fax:  [phone]  Name: BREEA LONCAR MRN: [id number] Date of Birth: [birth year]

## 2019-08-14 ENCOUNTER — Other Ambulatory Visit: Payer: Self-pay

## 2019-08-14 ENCOUNTER — Ambulatory Visit: Payer: Medicare Other | Admitting: Physical Therapy

## 2019-08-14 ENCOUNTER — Encounter: Payer: Self-pay | Admitting: Physical Therapy

## 2019-08-14 DIAGNOSIS — M542 Cervicalgia: Secondary | ICD-10-CM | POA: Diagnosis not present

## 2019-08-14 DIAGNOSIS — M5412 Radiculopathy, cervical region: Secondary | ICD-10-CM

## 2019-08-14 DIAGNOSIS — M6281 Muscle weakness (generalized): Secondary | ICD-10-CM

## 2019-08-14 NOTE — Therapy (Signed)
[place] [address] [place], [place], [address] Phone: [phone]   Fax:  [phone]  Physical Therapy Treatment  Patient Details  Name: Lori Alvarez MRN: [id number] Date of Birth: 1960-12-22 Referring Provider (PT): Elsner   Encounter Date: 08/14/2019  PT End of Session - 08/14/19 1422    Visit Number  4    Date for PT Re-Evaluation  09/30/19    PT Start Time  [id number]    PT Stop Time  1405    PT Time Calculation (min)  57 min    Activity Tolerance  Patient tolerated treatment well;Patient limited by pain;Patient limited by fatigue    Behavior During Therapy  [facility] for tasks assessed/performed       Past Medical History:  Diagnosis Date  . Postherpetic neuralgia     Past Surgical History:  Procedure Laterality Date  . APPENDECTOMY    . PARTIAL HYSTERECTOMY      There were no vitals filed for this visit.  Subjective Assessment - 08/14/19 1335    Subjective  I think it helps for a few days, I have less contant pain    Currently in Pain?  Yes    Pain Score  7                        [facility] Adult PT Treatment/Exercise - 08/14/19 0001      Ambulation/Gait   Gait Comments  1 lap around building slow pace some cues to hold arms in a different way as they seem to be subluxing some due to lack of mm tone in the shoulder girdle      Neck Exercises: Machines for Strengthening   UBE (Upper Arm Bike)  level 1 x 2 minutes with assist      Neck Exercises: Standing   Other Standing Exercises  ball rolling on mat table with arms      Neck Exercises: Supine   Other Supine Exercise  yellow tband triceps and rows with light assist to hold band    Other Supine Exercise  feet on ball K2C, trunk rotation, small bridges, isometric abs      Modalities   Modalities  Electrical Stimulation;Moist Heat      Moist Heat Therapy   Number Minutes Moist Heat  15 Minutes    Moist Heat Location  Cervical       Electrical Stimulation   Electrical Stimulation Location  upper trap and into the cervical area    Electrical Stimulation Action  IFC    Electrical Stimulation Parameters  sitting    Electrical Stimulation Goals  Pain      Manual Therapy   Manual Therapy  Soft tissue mobilization    Manual therapy comments  gentle stretches to the upper trap and levator    Soft tissue mobilization  cervical and upper trap area               PT Short Term Goals - 08/14/19 1426      PT SHORT TERM GOAL #1   Title  independent with initial HEP    Status  Achieved        PT Long Term Goals - 07/31/19 1652      PT LONG TERM GOAL #1   Title  report pain decreased 25%    Time  8    Period  Weeks    Status  New  PT LONG TERM GOAL #2   Title  report that she can do her hair on her own    Time  8    Period  Weeks    Status  New      PT LONG TERM GOAL #3   Title  increase right grip strength to 25#    Time  8    Period  Weeks    Status  New      PT LONG TERM GOAL #4   Title  increase cervical ROM 25%    Time  8    Period  Weeks    Status  New            Plan - 08/14/19 1423    Clinical Impression Statement  While walking with patient outside I observed that her arms are very lax and seem to hang I tried to check for a sulcus while she was walking but it seems to be okay, I do worry about this in the future, I gave some cues to hold her arms in front of her to take some of this stress off.  The right side seemed to have less tension but still very tight in the left shoulder and upper trap, this is the side that is the weakest, she needs assist to reach above the elbow level due to weakness    PT Next Visit Plan  continue to work on mms and decrease compensation    Consulted and Agree with Plan of Care  Patient       Patient will benefit from skilled therapeutic intervention in order to improve the following deficits and impairments:  Pain, Improper body mechanics,  Impaired sensation, Increased muscle spasms, Postural dysfunction, Impaired tone, Impaired UE functional use, Decreased strength, Decreased range of motion, Decreased activity tolerance, Impaired flexibility  Visit Diagnosis: Cervicalgia  Muscle weakness (generalized)  Radiculopathy, cervical region     Problem List Patient Active Problem List   Diagnosis Date Noted  . Collagenous colitis 07/28/2018  . Decreased sensation of lower extremity 07/20/2018  . ALS (amyotrophic lateral sclerosis) ([place]) 12/23/2017  . Muscle weakness (generalized) 11/23/2017  . Bilateral arm weakness 11/11/2017  . Elevated blood pressure reading without diagnosis of hypertension 11/11/2017  . Post herpetic neuralgia 11/11/2017  . MDS (myelodysplastic syndrome) ([place]) 01/22/2015    Sumner Boast., PT 08/14/2019, 2:26 PM  [address] [place], [place], [address] Phone: [phone]   Fax:  [phone]  Name: Lori Alvarez MRN: [id number] Date of Birth: 08/09/1961

## 2019-08-17 ENCOUNTER — Other Ambulatory Visit: Payer: Self-pay

## 2019-08-17 ENCOUNTER — Ambulatory Visit: Payer: Medicare Other | Admitting: Physical Therapy

## 2019-08-17 ENCOUNTER — Encounter: Payer: Self-pay | Admitting: Physical Therapy

## 2019-08-17 DIAGNOSIS — M542 Cervicalgia: Secondary | ICD-10-CM | POA: Diagnosis not present

## 2019-08-17 DIAGNOSIS — M6281 Muscle weakness (generalized): Secondary | ICD-10-CM | POA: Diagnosis not present

## 2019-08-17 DIAGNOSIS — M5412 Radiculopathy, cervical region: Secondary | ICD-10-CM

## 2019-08-17 NOTE — Therapy (Signed)
[place] [address] [place], [place], [address] Phone: [phone]   Fax:  [phone]  Physical Therapy Treatment  Patient Details  Name: Lori Alvarez MRN: [id number] Date of Birth: 1961-04-03 Referring Provider (PT): Elsner   Encounter Date: 08/17/2019  PT End of Session - 08/17/19 1524    Visit Number  5    Date for PT Re-Evaluation  09/30/19    PT Start Time  [id number]    PT Stop Time  [id number]    PT Time Calculation (min)  51 min    Activity Tolerance  Patient tolerated treatment well;Patient limited by pain;Patient limited by fatigue    Behavior During Therapy  [facility] for tasks assessed/performed       Past Medical History:  Diagnosis Date  . Postherpetic neuralgia     Past Surgical History:  Procedure Laterality Date  . APPENDECTOMY    . PARTIAL HYSTERECTOMY      There were no vitals filed for this visit.  Subjective Assessment - 08/17/19 1440    Subjective  I was really pretty sore after the last visit    Currently in Pain?  Yes    Pain Score  6     Pain Location  Neck    Pain Descriptors / Indicators  Throbbing    Aggravating Factors   maybe the last treatment                       [facility] Adult PT Treatment/Exercise - 08/17/19 0001      Neck Exercises: Machines for Strengthening   UBE (Upper Arm Bike)  level 1 x 2 minutes with assist      Neck Exercises: Supine   Other Supine Exercise  feet on ball K2C, trunk rotation, small bridges, isometric abs      Modalities   Modalities  Electrical Stimulation;Moist Heat      Moist Heat Therapy   Number Minutes Moist Heat  15 Minutes    Moist Heat Location  Cervical      Electrical Stimulation   Electrical Stimulation Location  upper trap and into the cervical area    Electrical Stimulation Action  IFC    Electrical Stimulation Parameters  sitting    Electrical Stimulation Goals  Pain      Manual Therapy   Manual Therapy  Soft  tissue mobilization;Manual Traction    Manual therapy comments  gentle stretches to the upper trap and levator    Soft tissue mobilization  cervical and upper trap area    Manual Traction  occipital release               PT Short Term Goals - 08/14/19 1426      PT SHORT TERM GOAL #1   Title  independent with initial HEP    Status  Achieved        PT Long Term Goals - 08/17/19 1526      PT LONG TERM GOAL #1   Title  report pain decreased 25%    Status  On-going      PT LONG TERM GOAL #2   Title  report that she can do her hair on her own    Status  On-going      PT LONG TERM GOAL #3   Title  increase right grip strength to 25#    Status  On-going  Plan - 08/17/19 1525    Clinical Impression Statement  Patient reports that the day of the last treatment she had more pain, I backed off some on the exercises and was not as deep on the STM, she did feel looser today with smaller trigger points, she did have some pain and tightness in the right pectoral area    PT Next Visit Plan  continue to work on mms and decrease compensation    Consulted and Agree with Plan of Care  Patient       Patient will benefit from skilled therapeutic intervention in order to improve the following deficits and impairments:  Pain, Improper body mechanics, Impaired sensation, Increased muscle spasms, Postural dysfunction, Impaired tone, Impaired UE functional use, Decreased strength, Decreased range of motion, Decreased activity tolerance, Impaired flexibility  Visit Diagnosis: Cervicalgia  Muscle weakness (generalized)  Radiculopathy, cervical region     Problem List Patient Active Problem List   Diagnosis Date Noted  . Collagenous colitis 07/28/2018  . Decreased sensation of lower extremity 07/20/2018  . ALS (amyotrophic lateral sclerosis) ([place]) 12/23/2017  . Muscle weakness (generalized) 11/23/2017  . Bilateral arm weakness 11/11/2017  . Elevated blood pressure  reading without diagnosis of hypertension 11/11/2017  . Post herpetic neuralgia 11/11/2017  . MDS (myelodysplastic syndrome) ([place]) 01/22/2015    Sumner Boast., PT 08/17/2019, 3:27 PM  [address] [place], [place], [address] Phone: [phone]   Fax:  [phone]  Name: Lori Alvarez MRN: [id number] Date of Birth: 12-04-1960

## 2019-08-21 ENCOUNTER — Other Ambulatory Visit: Payer: Self-pay

## 2019-08-21 ENCOUNTER — Ambulatory Visit: Payer: Medicare Other | Attending: Neurological Surgery | Admitting: Physical Therapy

## 2019-08-21 ENCOUNTER — Ambulatory Visit (INDEPENDENT_AMBULATORY_CARE_PROVIDER_SITE_OTHER): Payer: Medicare Other | Admitting: Psychiatry

## 2019-08-21 ENCOUNTER — Encounter: Payer: Self-pay | Admitting: Physical Therapy

## 2019-08-21 DIAGNOSIS — M6281 Muscle weakness (generalized): Secondary | ICD-10-CM | POA: Insufficient documentation

## 2019-08-21 DIAGNOSIS — F321 Major depressive disorder, single episode, moderate: Secondary | ICD-10-CM

## 2019-08-21 DIAGNOSIS — M542 Cervicalgia: Secondary | ICD-10-CM | POA: Diagnosis not present

## 2019-08-21 DIAGNOSIS — M5412 Radiculopathy, cervical region: Secondary | ICD-10-CM | POA: Diagnosis not present

## 2019-08-21 NOTE — Progress Notes (Signed)
[facility] Counselor/Therapist Progress Note  Patient ID: Lori Alvarez, MRN: [id number],    Date: 08/21/2019  Time Spent: 60 minutes   4:00pm to 5:00pm   Virtual Visit Note **Please note that this patient has advancing ALS and she is unable to do a visit with video.  She would not have the privacy she needs to be able to openly talk about her fears and anxieties as she faces a lot of uncertainty in her physical diagnosis. On patient's behalf, I request that Medicare consider covering her session, without a reduction. Thank you. Irish Lack  Connected with patient by a video enabled telemedicine/telehealth application or telephone, with their informed consent, and verified patient privacy and that I am speaking with the correct person using two identifiers. I discussed the limitations, risks, security and privacy concerns of performing psychotherapy and management service by telephone and the availability of in person appointments. I also discussed with the patient that there may be a patient responsible charge related to this service. The patient expressed understanding and agreed to proceed. I discussed the treatment planning with the patient. The patient was provided an opportunity to ask questions and all were answered. The patient agreed with the plan and demonstrated an understanding of the instructions. The patient was advised to call  our office if  symptoms worsen or feel they are in a crisis state and need immediate contact.   Therapist Location: [facility] Patient Location: home   Treatment Type: Individual Therapy  Reported Symptoms:  Anxiety, depression  Mental Status Exam:  Appearance:   n/a     telehealth     Behavior:  Appropriate and Sharing  Motor:  N/a   telehealth  Speech/Language:   Normal Rate  Affect:  n/a    telehealth  Mood:  anxious and depressed  Thought process:  normal  Thought content:    WNL  Sensory/Perceptual disturbances:     WNL  Orientation:  oriented to person, place, time/date, situation, day of week, month of year and year  Attention:  Good  Concentration:  Good  Memory:  WNL  Fund of knowledge:   Good  Insight:    Good  Judgment:   Good  Impulse Control:  Good   Risk Assessment: Danger to Self:  No Self-injurious Behavior: No Danger to Others: No Duty to Warn:no Physical Aggression / Violence:No  Access to Firearms a concern: No  Gang Involvement:No   Subjective:   Patient today reporting depression and anxiety related to her ALS condition.  Has started getting some PT which is helping physically a little bit but I helping her emotionally also to be able to try and do some physical task that either she hasn't been able to do nor try to do.  Gives her some hope to even try tasks in her PT sessions.     Interventions: Solution-Oriented/Positive Psychology and Ego-Supportive  Diagnosis:   ICD-10-CM   1. Moderate major depression, single episode ([place])  F32.1      Plan: Patient is not signing tx plan on a computer screen due to [place].  Treatment Goals: Goals may remain here in tx plan as patient works on strategies to meet her goals. Some changes made in goals today to better meet patient needs.  Long term goal: Develop the ability to recognize, accept, and cope with feelings of depression.  Short term goal: Learn and implement calming skills to reduce overall tension and moments of increased depression and anxiety.  Strategies: 1) Identify cognitive self-talk that supports depression. Work to interrupt those self-talk messages, and eventually replace it with self-talk that patient feels is more supportive to her in managing her ALS symptoms and progression. 2) Encourage patient's open, full expression of her depression and anxiety as she navigates her journey with advancing ALS.  PROGRESS: Patient today reports that she has been working some on her self-talk and trying to catch  herself whenever her self-talk is negative, interrupt it, and replace it with more positive, hopeful, and self-affirming self-talk.  She was able to give a clear example and we talked about a couple more examples in session today.  Also discussed some fears and concerns about her advancing ALS diagnosis and the unknown future.  She has a strong faith which helps her cope. She is to continue her self-monitoring of negative self-talk and replacing it as stated above; she readily admits that she knows she needs to stay on top of this.  Also reviewed her self-calming skills that help physical and emotional pain. Goal review and progress noted with patient.     Next appt in approx 3 weeks.   Shanon Ace, LCSW

## 2019-08-21 NOTE — Therapy (Signed)
[place] [address] [place], [place], [address] Phone: [phone]   Fax:  [phone]  Physical Therapy Treatment  Patient Details  Name: Lori Alvarez MRN: [id number] Date of Birth: 10-Mar-1961 Referring Provider (PT): Elsner   Encounter Date: 08/21/2019  PT End of Session - 08/21/19 1352    Visit Number  6    Date for PT Re-Evaluation  09/30/19    PT Start Time  [id number]    PT Stop Time  1406    PT Time Calculation (min)  61 min    Activity Tolerance  Patient tolerated treatment well;Patient limited by pain;Patient limited by fatigue    Behavior During Therapy  [facility] for tasks assessed/performed       Past Medical History:  Diagnosis Date  . Postherpetic neuralgia     Past Surgical History:  Procedure Laterality Date  . APPENDECTOMY    . PARTIAL HYSTERECTOMY      There were no vitals filed for this visit.  Subjective Assessment - 08/21/19 1310    Subjective  Reports that she is still sore after PT, but thinks that she is having less pain overall especially in the arms    Currently in Pain?  Yes    Pain Score  5     Pain Location  Neck    Pain Relieving Factors  the treatment is helping                       [facility] Adult PT Treatment/Exercise - 08/21/19 0001      Neck Exercises: Machines for Strengthening   UBE (Upper Arm Bike)  level 1 x 3 minutes set up assist and then her on own    Other Machines for Strengthening  leg extension 5# x 12, leg flexion 15# x 12, leg press 20# x 8, no weight x 8      Neck Exercises: Seated   Other Seated Exercise  biceps with tube and 1.5# weight had to do eccentrics and sets of 5, yellow tband triceps and rows had to tie on hand due to no grip strength    Other Seated Exercise  ball push, fwd and down      Neck Exercises: Supine   Other Supine Exercise  feet on ball K2C, trunk rotation, small bridges, isometric abs      Moist Heat Therapy   Number Minutes Moist Heat  15 Minutes    Moist Heat Location  Cervical      Electrical Stimulation   Electrical Stimulation Location  upper trap and into the cervical area    Electrical Stimulation Action  IFC    Electrical Stimulation Parameters  sitting    Electrical Stimulation Goals  Pain      Manual Therapy   Manual Therapy  Soft tissue mobilization;Manual Traction    Manual therapy comments  gentle stretches to the upper trap and levator    Soft tissue mobilization  cervical and upper trap area    Manual Traction  occipital release               PT Short Term Goals - 08/14/19 1426      PT SHORT TERM GOAL #1   Title  independent with initial HEP    Status  Achieved        PT Long Term Goals - 08/21/19 1354      PT LONG TERM GOAL #1   Title  report pain decreased 25%    Status  On-going      PT LONG TERM GOAL #2   Title  report that she can do her hair on her own    Status  On-going            Plan - 08/21/19 1353    Clinical Impression Statement  Patient reports that she is sore the day of our treatment, she reports that she is haivng less overall pain in the arms now, she is extremely weak in the arms and requires assist with most activities, difficulty to get her core to activate    PT Next Visit Plan  continue to work on mms and decrease compensation    Consulted and Agree with Plan of Care  Patient       Patient will benefit from skilled therapeutic intervention in order to improve the following deficits and impairments:  Pain, Improper body mechanics, Impaired sensation, Increased muscle spasms, Postural dysfunction, Impaired tone, Impaired UE functional use, Decreased strength, Decreased range of motion, Decreased activity tolerance, Impaired flexibility  Visit Diagnosis: Cervicalgia  Muscle weakness (generalized)  Radiculopathy, cervical region     Problem List Patient Active Problem List   Diagnosis Date Noted  . Collagenous  colitis 07/28/2018  . Decreased sensation of lower extremity 07/20/2018  . ALS (amyotrophic lateral sclerosis) ([place]) 12/23/2017  . Muscle weakness (generalized) 11/23/2017  . Bilateral arm weakness 11/11/2017  . Elevated blood pressure reading without diagnosis of hypertension 11/11/2017  . Post herpetic neuralgia 11/11/2017  . MDS (myelodysplastic syndrome) ([place]) 01/22/2015    Sumner Boast., PT 08/21/2019, 1:55 PM  [address] [place], [place], [address] Phone: [phone]   Fax:  [phone]  Name: Lori Alvarez MRN: [id number] Date of Birth: 1960/12/23

## 2019-08-24 ENCOUNTER — Encounter: Payer: Self-pay | Admitting: Physical Therapy

## 2019-08-24 ENCOUNTER — Ambulatory Visit: Payer: Medicare Other | Admitting: Physical Therapy

## 2019-08-24 ENCOUNTER — Other Ambulatory Visit: Payer: Self-pay

## 2019-08-24 DIAGNOSIS — M6281 Muscle weakness (generalized): Secondary | ICD-10-CM

## 2019-08-24 DIAGNOSIS — H00014 Hordeolum externum left upper eyelid: Secondary | ICD-10-CM | POA: Diagnosis not present

## 2019-08-24 DIAGNOSIS — M542 Cervicalgia: Secondary | ICD-10-CM

## 2019-08-24 DIAGNOSIS — M5412 Radiculopathy, cervical region: Secondary | ICD-10-CM | POA: Diagnosis not present

## 2019-08-24 NOTE — Therapy (Signed)
[place] [address] [place], [place], [address] Phone: [phone]   Fax:  [phone]  Physical Therapy Treatment  Patient Details  Name: AMAHYA GEITZ MRN: [id number] Date of Birth: 1961/07/07 Referring Provider (PT): Elsner   Encounter Date: 08/24/2019  PT End of Session - 08/24/19 1529    Visit Number  7    Date for PT Re-Evaluation  09/30/19    PT Start Time  [id number]    PT Stop Time  1538    PT Time Calculation (min)  60 min    Activity Tolerance  Patient tolerated treatment well;Patient limited by pain;Patient limited by fatigue    Behavior During Therapy  [facility] for tasks assessed/performed       Past Medical History:  Diagnosis Date  . Postherpetic neuralgia     Past Surgical History:  Procedure Laterality Date  . APPENDECTOMY    . PARTIAL HYSTERECTOMY      There were no vitals filed for this visit.  Subjective Assessment - 08/24/19 1440    Subjective  Not too bad recently    Currently in Pain?  Yes    Pain Score  4     Pain Location  Neck                       [facility] Adult PT Treatment/Exercise - 08/24/19 0001      Ambulation/Gait   Gait Comments  resisted gait in the hall fwd and backward, side stepping without resistance      Neck Exercises: Machines for Strengthening   UBE (Upper Arm Bike)  level 1 x 3 minutes set up assist and then her on own    Other Machines for Strengthening  leg extension 5# x 12, leg flexion 15# x 12, leg press 20# x 8, no weight x 8      Neck Exercises: Standing   Other Standing Exercises  use of sliding board and pillow case, single arm shoulder flexion and then some add/abduction      Neck Exercises: Seated   Other Seated Exercise  ball push, fwd and down      Moist Heat Therapy   Number Minutes Moist Heat  15 Minutes    Moist Heat Location  Cervical      Electrical Stimulation   Electrical Stimulation Location  upper trap and into the  cervical area    Electrical Stimulation Action  IFC    Electrical Stimulation Parameters  sitting    Electrical Stimulation Goals  Pain      Manual Therapy   Manual Therapy  Soft tissue mobilization;Manual Traction    Manual therapy comments  gentle stretches to the upper trap and levator    Soft tissue mobilization  cervical and upper trap area    Manual Traction  occipital release               PT Short Term Goals - 08/14/19 1426      PT SHORT TERM GOAL #1   Title  independent with initial HEP    Status  Achieved        PT Long Term Goals - 08/24/19 1531      PT LONG TERM GOAL #1   Title  report pain decreased 25%    Status  On-going      PT LONG TERM GOAL #2   Title  report that she can do her hair on her own  Status  On-going      PT LONG TERM GOAL #3   Title  increase right grip strength to 25#    Status  On-going            Plan - 08/24/19 1530    Clinical Impression Statement  tried some different activities, she did well with some but again needs a lot of verbal cues to decreae shoulder elevation.  and allow better movements, her movements were very good with her legs had hands that were purple at times and cold    PT Next Visit Plan  continue to work on mms and decrease compensation    Consulted and Agree with Plan of Care  Patient       Patient will benefit from skilled therapeutic intervention in order to improve the following deficits and impairments:  Pain, Improper body mechanics, Impaired sensation, Increased muscle spasms, Postural dysfunction, Impaired tone, Impaired UE functional use, Decreased strength, Decreased range of motion, Decreased activity tolerance, Impaired flexibility  Visit Diagnosis: Cervicalgia  Muscle weakness (generalized)  Radiculopathy, cervical region     Problem List Patient Active Problem List   Diagnosis Date Noted  . Collagenous colitis 07/28/2018  . Decreased sensation of lower extremity 07/20/2018   . ALS (amyotrophic lateral sclerosis) ([place]) 12/23/2017  . Muscle weakness (generalized) 11/23/2017  . Bilateral arm weakness 11/11/2017  . Elevated blood pressure reading without diagnosis of hypertension 11/11/2017  . Post herpetic neuralgia 11/11/2017  . MDS (myelodysplastic syndrome) ([place]) 01/22/2015    Sumner Boast., PT 08/24/2019, 3:32 PM  [place] [address] [place], [place], [address] Phone: [phone]   Fax:  [phone]  Name: CORDELLIA SHAUGHNESSY MRN: [id number] Date of Birth: 11-27-1960

## 2019-08-25 DIAGNOSIS — M4802 Spinal stenosis, cervical region: Secondary | ICD-10-CM | POA: Diagnosis not present

## 2019-08-28 ENCOUNTER — Other Ambulatory Visit: Payer: Self-pay

## 2019-08-28 ENCOUNTER — Ambulatory Visit: Payer: Medicare Other | Admitting: Physical Therapy

## 2019-08-28 ENCOUNTER — Encounter: Payer: Self-pay | Admitting: Physical Therapy

## 2019-08-28 DIAGNOSIS — M6281 Muscle weakness (generalized): Secondary | ICD-10-CM

## 2019-08-28 DIAGNOSIS — M542 Cervicalgia: Secondary | ICD-10-CM | POA: Diagnosis not present

## 2019-08-28 DIAGNOSIS — M5412 Radiculopathy, cervical region: Secondary | ICD-10-CM | POA: Diagnosis not present

## 2019-08-28 NOTE — Therapy (Signed)
[place] [place] [address] [place], [place], [address] Phone: [phone]   Fax:  [phone]  Physical Therapy Treatment  Patient Details  Name: Lori Alvarez MRN: [id number] Date of Birth: 12/28/1960 Referring Provider (PT): Elsner   Encounter Date: 08/28/2019  PT End of Session - 08/28/19 1529    Visit Number  8    Date for PT Re-Evaluation  09/30/19    PT Start Time  1440    PT Stop Time  1540    PT Time Calculation (min)  60 min    Activity Tolerance  Patient tolerated treatment well;Patient limited by pain;Patient limited by fatigue    Behavior During Therapy  [facility] for tasks assessed/performed       Past Medical History:  Diagnosis Date  . Postherpetic neuralgia     Past Surgical History:  Procedure Laterality Date  . APPENDECTOMY    . PARTIAL HYSTERECTOMY      There were no vitals filed for this visit.  Subjective Assessment - 08/28/19 1440    Subjective  REally tired, reports that she has been very busy and has had an eye issue    Currently in Pain?  Yes    Pain Score  5     Pain Location  Neck    Aggravating Factors   stress                       [facility] Adult PT Treatment/Exercise - 08/28/19 0001      Neck Exercises: Machines for Strengthening   UBE (Upper Arm Bike)  level 1 x 3 minutes set up assist and then her on own      Neck Exercises: Standing   Other Standing Exercises  velro board with assist key type handle, eggcerciser for grip and needing help, finger add/abduction, hands flat finger raises, 2# shrugs, marble pick ups with PT assist at the elbow and cues to not elevate the shoulders      Moist Heat Therapy   Number Minutes Moist Heat  15 Minutes    Moist Heat Location  Cervical      Electrical Stimulation   Electrical Stimulation Location  upper trap and into the cervical area    Electrical Stimulation Action  IFC    Electrical Stimulation Parameters  sitting     Electrical Stimulation Goals  Pain      Manual Therapy   Manual Therapy  Soft tissue mobilization;Manual Traction    Manual therapy comments  gentle stretches to the upper trap and levator    Soft tissue mobilization  cervical and upper trap area    Manual Traction  occipital release               PT Short Term Goals - 08/14/19 1426      PT SHORT TERM GOAL #1   Title  independent with initial HEP    Status  Achieved        PT Long Term Goals - 08/24/19 1531      PT LONG TERM GOAL #1   Title  report pain decreased 25%    Status  On-going      PT LONG TERM GOAL #2   Title  report that she can do her hair on her own    Status  On-going      PT LONG TERM GOAL #3   Title  increase right grip strength to 25#  Status  On-going            Plan - 08/28/19 1532    Clinical Impression Statement  Patient still really having a hard time with arm motions and hand control.  I tried to do some pelvic tilts this was very difficult, she needed a lot of verbal and tactile cues to get PPT and for abdominal contraction    PT Next Visit Plan  continue to work on mms and decrease compensation    Consulted and Agree with Plan of Care  Patient       Patient will benefit from skilled therapeutic intervention in order to improve the following deficits and impairments:  Pain, Improper body mechanics, Impaired sensation, Increased muscle spasms, Postural dysfunction, Impaired tone, Impaired UE functional use, Decreased strength, Decreased range of motion, Decreased activity tolerance, Impaired flexibility  Visit Diagnosis: Cervicalgia  Muscle weakness (generalized)  Radiculopathy, cervical region     Problem List Patient Active Problem List   Diagnosis Date Noted  . Collagenous colitis 07/28/2018  . Decreased sensation of lower extremity 07/20/2018  . ALS (amyotrophic lateral sclerosis) ([place]) 12/23/2017  . Muscle weakness (generalized) 11/23/2017  . Bilateral arm  weakness 11/11/2017  . Elevated blood pressure reading without diagnosis of hypertension 11/11/2017  . Post herpetic neuralgia 11/11/2017  . MDS (myelodysplastic syndrome) ([place]) 01/22/2015    Sumner Boast., PT 08/28/2019, 3:35 PM  [address] [place], [place], [address] Phone: [phone]   Fax:  [phone]  Name: LORITTA MICHALAK MRN: [id number] Date of Birth: 04/10/1961

## 2019-08-31 ENCOUNTER — Encounter: Payer: Self-pay | Admitting: Physical Therapy

## 2019-08-31 ENCOUNTER — Other Ambulatory Visit: Payer: Self-pay

## 2019-08-31 ENCOUNTER — Ambulatory Visit: Payer: Medicare Other | Admitting: Physical Therapy

## 2019-08-31 DIAGNOSIS — M5412 Radiculopathy, cervical region: Secondary | ICD-10-CM

## 2019-08-31 DIAGNOSIS — M6281 Muscle weakness (generalized): Secondary | ICD-10-CM

## 2019-08-31 DIAGNOSIS — M542 Cervicalgia: Secondary | ICD-10-CM | POA: Diagnosis not present

## 2019-08-31 NOTE — Therapy (Signed)
[address] [place], [place], [address] Phone: [phone]   Fax:  [phone]  Physical Therapy Treatment  Patient Details  Name: Lori Alvarez MRN: [id number] Date of Birth: [birth year] Referring Provider (PT): Elsner   Encounter Date: 08/31/2019  PT End of Session - 08/31/19 1610    Visit Number  9    Date for PT Re-Evaluation  09/30/19    PT Start Time  1440    PT Stop Time  1536    PT Time Calculation (min)  56 min    Activity Tolerance  Patient tolerated treatment well;Patient limited by pain;Patient limited by fatigue    Behavior During Therapy  [facility] for tasks assessed/performed       Past Medical History:  Diagnosis Date  . Postherpetic neuralgia     Past Surgical History:  Procedure Laterality Date  . APPENDECTOMY    . PARTIAL HYSTERECTOMY      There were no vitals filed for this visit.  Subjective Assessment - 08/31/19 1439    Subjective  Patient reports that she was very sore after the last vist    Currently in Pain?  Yes    Pain Score  6     Pain Location  Neck    Aggravating Factors   maybe the rainy weather is making it worse today                       [facility] Adult PT Treatment/Exercise - 08/31/19 0001      Neck Exercises: Machines for Strengthening   UBE (Upper Arm Bike)  level 1 x 3 minutes set up assist and then her on own      Neck Exercises: Standing   Other Standing Exercises  velcro board with assist key type handle, eggcerciser for grip and needing help, finger add/abduction, hands flat finger raises, 2# shrugs, marble pick ups with PT assist at the elbow and cues to not elevate the shoulders      Neck Exercises: Supine   Other Supine Exercise  feet on ball K2C, trunk rotation, small bridges, isometric abs      Modalities   Modalities  Electrical Stimulation;Moist Heat      Moist Heat Therapy   Number Minutes Moist Heat  15 Minutes    Moist  Heat Location  Cervical      Electrical Stimulation   Electrical Stimulation Location  upper trap and into the cervical area    Electrical Stimulation Action  IFC    Electrical Stimulation Parameters  sitting    Electrical Stimulation Goals  Pain      Manual Therapy   Manual Therapy  Soft tissue mobilization;Manual Traction    Manual therapy comments  gentle stretches to the upper trap and levator    Soft tissue mobilization  cervical and upper trap area, approximation with movement of the fingers and the wrists bilaterally    Manual Traction  occipital release               PT Short Term Goals - 08/14/19 1426      PT SHORT TERM GOAL #1   Title  independent with initial HEP    Status  Achieved        PT Long Term Goals - 08/31/19 1611      PT LONG TERM GOAL #1   Title  report pain decreased 25%    Status  On-going  PT LONG TERM GOAL #2   Title  report that she can do her hair on her own    Status  On-going            Plan - 08/31/19 1610    Clinical Impression Statement  Tried to add some approximation with movements for the fingers today, she had reported increased pain and soreness after the last visit so we backed off a little today    PT Next Visit Plan  continue to work on mms and decrease compensation    Consulted and Agree with Plan of Care  Patient       Patient will benefit from skilled therapeutic intervention in order to improve the following deficits and impairments:  Pain, Improper body mechanics, Impaired sensation, Increased muscle spasms, Postural dysfunction, Impaired tone, Impaired UE functional use, Decreased strength, Decreased range of motion, Decreased activity tolerance, Impaired flexibility  Visit Diagnosis: Cervicalgia  Muscle weakness (generalized)  Radiculopathy, cervical region     Problem List Patient Active Problem List   Diagnosis Date Noted  . Collagenous colitis 07/28/2018  . Decreased sensation of lower  extremity 07/20/2018  . ALS (amyotrophic lateral sclerosis) ([place]) 12/23/2017  . Muscle weakness (generalized) 11/23/2017  . Bilateral arm weakness 11/11/2017  . Elevated blood pressure reading without diagnosis of hypertension 11/11/2017  . Post herpetic neuralgia 11/11/2017  . MDS (myelodysplastic syndrome) ([place]) 01/22/2015    Sumner Boast., PT 08/31/2019, 4:29 PM  [address] [place], [place], [address] Phone: [phone]   Fax:  [phone]  Name: Lori Alvarez MRN: [id number] Date of Birth: [birth year]

## 2019-09-04 ENCOUNTER — Ambulatory Visit: Payer: Medicare Other | Admitting: Physical Therapy

## 2019-09-04 DIAGNOSIS — G8929 Other chronic pain: Secondary | ICD-10-CM | POA: Diagnosis not present

## 2019-09-04 DIAGNOSIS — G1221 Amyotrophic lateral sclerosis: Secondary | ICD-10-CM | POA: Diagnosis not present

## 2019-09-04 DIAGNOSIS — E7800 Pure hypercholesterolemia, unspecified: Secondary | ICD-10-CM | POA: Diagnosis not present

## 2019-09-04 DIAGNOSIS — F419 Anxiety disorder, unspecified: Secondary | ICD-10-CM | POA: Diagnosis not present

## 2019-09-04 DIAGNOSIS — G43009 Migraine without aura, not intractable, without status migrainosus: Secondary | ICD-10-CM | POA: Diagnosis not present

## 2019-09-05 ENCOUNTER — Other Ambulatory Visit: Payer: Self-pay

## 2019-09-05 ENCOUNTER — Encounter (HOSPITAL_BASED_OUTPATIENT_CLINIC_OR_DEPARTMENT_OTHER): Payer: Self-pay | Admitting: Emergency Medicine

## 2019-09-05 ENCOUNTER — Emergency Department (HOSPITAL_BASED_OUTPATIENT_CLINIC_OR_DEPARTMENT_OTHER)
Admission: EM | Admit: 2019-09-05 | Discharge: 2019-09-05 | Disposition: A | Discharge status: Home / Self Care | Payer: Medicare Other | Attending: Emergency Medicine | Admitting: Emergency Medicine

## 2019-09-05 ENCOUNTER — Emergency Department (HOSPITAL_BASED_OUTPATIENT_CLINIC_OR_DEPARTMENT_OTHER): Payer: Medicare Other

## 2019-09-05 DIAGNOSIS — M791 Myalgia, unspecified site: Secondary | ICD-10-CM | POA: Diagnosis not present

## 2019-09-05 DIAGNOSIS — R509 Fever, unspecified: Secondary | ICD-10-CM | POA: Insufficient documentation

## 2019-09-05 DIAGNOSIS — Z20828 Contact with and (suspected) exposure to other viral communicable diseases: Secondary | ICD-10-CM | POA: Insufficient documentation

## 2019-09-05 DIAGNOSIS — Z79899 Other long term (current) drug therapy: Secondary | ICD-10-CM | POA: Diagnosis not present

## 2019-09-05 DIAGNOSIS — G1221 Amyotrophic lateral sclerosis: Secondary | ICD-10-CM | POA: Insufficient documentation

## 2019-09-05 DIAGNOSIS — J111 Influenza due to unidentified influenza virus with other respiratory manifestations: Secondary | ICD-10-CM

## 2019-09-05 HISTORY — DX: Amyotrophic lateral sclerosis: G12.21

## 2019-09-05 LAB — CBC WITH DIFFERENTIAL/PLATELET
Abs Immature Granulocytes: 0.06 10*3/uL (ref 0.00–0.07)
Basophils Absolute: 0 10*3/uL (ref 0.0–0.1)
Basophils Relative: 0 %
Eosinophils Absolute: 0 10*3/uL (ref 0.0–0.5)
Eosinophils Relative: 0 %
HCT: 39.9 % (ref 36.0–46.0)
Hemoglobin: 13 g/dL (ref 12.0–15.0)
Immature Granulocytes: 1 %
Lymphocytes Relative: 7 %
Lymphs Abs: 0.9 10*3/uL (ref 0.7–4.0)
MCH: 29 pg (ref 26.0–34.0)
MCHC: 32.6 g/dL (ref 30.0–36.0)
MCV: 89.1 fL (ref 80.0–100.0)
Monocytes Absolute: 0.7 10*3/uL (ref 0.1–1.0)
Monocytes Relative: 5 %
Neutro Abs: 10.6 10*3/uL — ABNORMAL HIGH (ref 1.7–7.7)
Neutrophils Relative %: 87 %
Platelets: 278 10*3/uL (ref 150–400)
RBC: 4.48 MIL/uL (ref 3.87–5.11)
RDW: 15.9 % — ABNORMAL HIGH (ref 11.5–15.5)
WBC: 12.2 10*3/uL — ABNORMAL HIGH (ref 4.0–10.5)
nRBC: 0 % (ref 0.0–0.2)

## 2019-09-05 LAB — COMPREHENSIVE METABOLIC PANEL
ALT: 23 U/L (ref 0–44)
AST: 24 U/L (ref 15–41)
Albumin: 3.7 g/dL (ref 3.5–5.0)
Alkaline Phosphatase: 72 U/L (ref 38–126)
Anion gap: 11 (ref 5–15)
BUN: 17 mg/dL (ref 6–20)
CO2: 22 mmol/L (ref 22–32)
Calcium: 9.1 mg/dL (ref 8.9–10.3)
Chloride: 99 mmol/L (ref 98–111)
Creatinine, Ser: 0.68 mg/dL (ref 0.44–1.00)
GFR calc Af Amer: >60 mL/min (ref >60)
GFR calc non Af Amer: >60 mL/min (ref >60)
Glucose, Bld: 135 mg/dL — ABNORMAL HIGH (ref 70–99)
Potassium: 3.6 mmol/L (ref 3.5–5.1)
Sodium: 132 mmol/L — ABNORMAL LOW (ref 135–145)
Total Bilirubin: 0.4 mg/dL (ref 0.3–1.2)
Total Protein: 7.1 g/dL (ref 6.5–8.1)

## 2019-09-05 LAB — URINALYSIS, ROUTINE W REFLEX MICROSCOPIC
Bilirubin Urine: NEGATIVE
Glucose, UA: NEGATIVE mg/dL
Ketones, ur: NEGATIVE mg/dL
Leukocytes,Ua: NEGATIVE
Nitrite: NEGATIVE
Protein, ur: NEGATIVE mg/dL
Specific Gravity, Urine: 1.02 (ref 1.005–1.030)
pH: 6 (ref 5.0–8.0)

## 2019-09-05 LAB — URINALYSIS, MICROSCOPIC (REFLEX)

## 2019-09-05 LAB — SARS CORONAVIRUS 2 (TAT 6-24 HRS): SARS Coronavirus 2: NEGATIVE

## 2019-09-05 LAB — LIPASE, BLOOD: Lipase: 20 U/L (ref 11–51)

## 2019-09-05 MED ORDER — DIPHENHYDRAMINE HCL 25 MG PO CAPS
25.0000 mg | ORAL_CAPSULE | Freq: Once | ORAL | Status: AC
  Administered 2019-09-05: 25 mg via ORAL
  Filled 2019-09-05: qty 1

## 2019-09-05 MED ORDER — PROCHLORPERAZINE EDISYLATE 10 MG/2ML IJ SOLN
10.0000 mg | Freq: Once | INTRAMUSCULAR | Status: AC
  Administered 2019-09-05: 10 mg via INTRAVENOUS
  Filled 2019-09-05: qty 2

## 2019-09-05 MED ORDER — ONDANSETRON HCL 4 MG PO TABS: 4.0000 mg | ORAL_TABLET | Freq: Four times a day (QID) | ORAL | 0 refills | Status: DC

## 2019-09-05 MED ORDER — DEXAMETHASONE SODIUM PHOSPHATE 10 MG/ML IJ SOLN
10.0000 mg | Freq: Once | INTRAMUSCULAR | Status: AC
  Administered 2019-09-05: 10 mg via INTRAVENOUS
  Filled 2019-09-05: qty 1

## 2019-09-05 MED ORDER — CEPHALEXIN 500 MG PO CAPS: 500.0000 mg | ORAL_CAPSULE | Freq: Two times a day (BID) | ORAL | 0 refills | Status: AC

## 2019-09-05 MED ORDER — SODIUM CHLORIDE 0.9 % IV BOLUS
1000.0000 mL | Freq: Once | INTRAVENOUS | Status: AC
  Administered 2019-09-05: 08:00:00 1000 mL via INTRAVENOUS

## 2019-09-05 MED FILL — CEPHALEXIN 500 MG CAPSULE: 500 | 5 days supply | Qty: 10 | Fill #0

## 2019-09-05 MED FILL — ONDANSETRON HCL 4 MG TABLET: 4 | 3 days supply | Qty: 12 | Fill #0

## 2019-09-05 NOTE — ED Provider Notes (Signed)
[facility] EMERGENCY DEPARTMENT Provider Note   CSN: [id number] Arrival date & time: 09/05/19  [id number]     History   Chief Complaint Chief Complaint  Patient presents with  . Fever    HPI Lori Alvarez is a 58 y.o. female.     The history is provided by the patient.  Fever Max temp prior to arrival:  101 Temp source:  Oral Onset quality:  Gradual Progression:  Resolved Chronicity:  New Relieved by:  Acetaminophen Worsened by:  Nothing Associated symptoms: headaches (history of migraines) and myalgias   Associated symptoms: no chest pain, no chills, no confusion, no congestion, no cough, no diarrhea, no dysuria, no ear pain, no rash, no sore throat and no vomiting   Risk factors: no sick contacts   Risk factors comment:  Hx of ALS, goes to PT weekly but no other known covid exposures   Past Medical History:  Diagnosis Date  . ALS (amyotrophic lateral sclerosis) ([place])   . Postherpetic neuralgia     Patient Active Problem List   Diagnosis Date Noted  . Collagenous colitis 07/28/2018  . Decreased sensation of lower extremity 07/20/2018  . ALS (amyotrophic lateral sclerosis) ([place]) 12/23/2017  . Muscle weakness (generalized) 11/23/2017  . Bilateral arm weakness 11/11/2017  . Elevated blood pressure reading without diagnosis of hypertension 11/11/2017  . Post herpetic neuralgia 11/11/2017  . MDS (myelodysplastic syndrome) ([place]) 01/22/2015    Past Surgical History:  Procedure Laterality Date  . APPENDECTOMY    . PARTIAL HYSTERECTOMY       OB History   No obstetric history on file.      Home Medications    Prior to Admission medications   Medication Sig Start Date End Date Taking? Authorizing Provider  ALPRAZolam Duanne Moron) 0.5 MG tablet Take 0.5 mg by mouth as needed for anxiety.    [provider]  Black Cohosh (REMIFEMIN) 20 MG TABS Take 20 mg by mouth 2 (two) times daily.    [provider]  CALCIUM-VITAMIN D PO Take 1  tablet by mouth daily. 600-800    [provider]  Cyanocobalamin (VITAMIN B-12 PO) Take 500 mcg by mouth daily.    [provider]  meloxicam (MOBIC) 15 MG tablet Take 15 mg by mouth daily.    [provider]  Multiple Vitamins-Minerals (MULTIVITAMIN ADULTS PO) Take 1 tablet by mouth daily.    [provider]  rizatriptan (MAXALT) 10 MG tablet  08/08/19   [provider]  VITAMIN E PO Take 400 Units by mouth daily.    [provider]  Zinc 50 MG TABS Take 50 mg by mouth daily.    [provider]    Family History Family History  Problem Relation Age of Onset  . Myopathy Mother   . Cancer Father        renal    Social History Social History   Tobacco Use  . Smoking status: Never Smoker  . Smokeless tobacco: Never Used  Substance Use Topics  . Alcohol use: No    Frequency: Never  . Drug use: No     Allergies   Allegra [fexofenadine], Clotrim-undecyl-tea tr-lav oil, Doxepin, Nitroglycerin, Nitrofuran derivatives, and Sulfa antibiotics   Review of Systems Review of Systems  Constitutional: Positive for fever. Negative for chills.  HENT: Negative for congestion, ear pain and sore throat.   Eyes: Negative for pain and visual disturbance.  Respiratory: Negative for cough and shortness of breath.  Cardiovascular: Negative for chest pain and palpitations.  Gastrointestinal: Negative for abdominal pain, diarrhea and vomiting.  Genitourinary: Negative for dysuria and hematuria.  Musculoskeletal: Positive for myalgias. Negative for arthralgias and back pain.  Skin: Negative for color change and rash.  Neurological: Positive for weakness (baseline), light-headedness and headaches (history of migraines). Negative for seizures and syncope.  Psychiatric/Behavioral: Negative for confusion.  All other systems reviewed and are negative.    Physical Exam Updated Vital Signs  ED Triage Vitals  Enc Vitals Group     BP  09/05/19 0716 123/69     Pulse Rate 09/05/19 0716 (!) 111     Resp 09/05/19 0716 16     Temp 09/05/19 0716 98.4 F (36.9 C)     Temp Source 09/05/19 0716 Oral     SpO2 09/05/19 0716 93 %     Weight 09/05/19 0716 142 lb 6.4 oz (64.6 kg)     Height 09/05/19 0716 5\' 3"  (1.6 m)     Head Circumference --      Peak Flow --      Pain Score 09/05/19 0717 8     Pain Loc --      Pain Edu? --      Excl. in [place]? --     Physical Exam Vitals signs and nursing note reviewed.  Constitutional:      General: She is not in acute distress.    Appearance: She is well-developed. She is not ill-appearing.  HENT:     Head: Normocephalic and atraumatic.     Nose: Nose normal.     Mouth/Throat:     Mouth: Mucous membranes are moist.  Eyes:     Extraocular Movements: Extraocular movements intact.     Conjunctiva/sclera: Conjunctivae normal.     Pupils: Pupils are equal, round, and reactive to light.  Neck:     Musculoskeletal: Normal range of motion and neck supple. No muscular tenderness.  Cardiovascular:     Rate and Rhythm: Normal rate and regular rhythm.     Pulses: Normal pulses.     Heart sounds: Normal heart sounds. No murmur.  Pulmonary:     Effort: Pulmonary effort is normal. No respiratory distress.     Breath sounds: Normal breath sounds.  Abdominal:     General: Abdomen is flat. There is no distension.     Palpations: Abdomen is soft.     Tenderness: There is no abdominal tenderness.  Musculoskeletal:        General: No tenderness.  Skin:    General: Skin is warm and dry.     Capillary Refill: Capillary refill takes less than 2 seconds.  Neurological:     General: No focal deficit present.     Mental Status: She is alert.     Comments: 4+ out of 5 strength in upper extremities, 5+5 strength in the lower extremities, normal speech  Psychiatric:        Mood and Affect: Mood normal.      ED Treatments / Results  Labs (all labs ordered are listed, but only abnormal results  are displayed) Labs Reviewed  URINALYSIS, ROUTINE W REFLEX MICROSCOPIC - Abnormal; Notable for the following components:      Result Value   Hgb urine dipstick TRACE (*)    All other components within normal limits  CBC WITH DIFFERENTIAL/PLATELET - Abnormal; Notable for the following components:   WBC 12.2 (*)    RDW 15.9 (*)    Neutro Abs 10.6 (*)  All other components within normal limits  COMPREHENSIVE METABOLIC PANEL - Abnormal; Notable for the following components:   Sodium 132 (*)    Glucose, Bld 135 (*)    All other components within normal limits  URINALYSIS, MICROSCOPIC (REFLEX) - Abnormal; Notable for the following components:   Bacteria, UA MANY (*)    All other components within normal limits  URINE CULTURE  SARS CORONAVIRUS 2 (TAT 6-24 HRS)  LIPASE, BLOOD    EKG None  Radiology Dg Chest Portable 1 View  Result Date: 09/05/2019 CLINICAL DATA:  Fever and headache EXAM: PORTABLE CHEST 1 VIEW COMPARISON:  None. FINDINGS: There is bibasilar atelectasis. Lungs elsewhere are clear. Heart size and pulmonary vascularity are normal. No adenopathy. No bone lesions. IMPRESSION: Bibasilar atelectasis. No edema or consolidation. Cardiac silhouette within normal limits. No evident adenopathy. Electronically Signed   By: Lowella Grip III M.D.   On: 09/05/2019 08:01    Procedures Procedures (including critical care time)  Medications Ordered in ED Medications  sodium chloride 0.9 % bolus 1,000 mL (1,000 mLs Intravenous New Bag/Given 09/05/19 0814)  prochlorperazine (COMPAZINE) injection 10 mg (10 mg Intravenous Given 09/05/19 0816)  diphenhydrAMINE (BENADRYL) capsule 25 mg (25 mg Oral Given 09/05/19 [id number])     Initial Impression / Assessment and Plan / ED Course  I have reviewed the triage vital signs and the nursing notes.  Pertinent labs & imaging results that were available during my care of the patient were reviewed by me and considered in my medical decision  making (see chart for details).     SIMISOLA CHRISP is a 58 year old female with history of ALS, migraines who presents the ED with fever, body aches, headaches.  Patient with unremarkable vitals.  No fever.  States that she had a fever this morning of 101.  No signs of meningitis on exam.  With her ALS she gets migraines, body aches at times.  She was concerned because she had a fever this morning.  She does go to physical therapy outpatient and concern for possible coronavirus.  Has not had any shortness of breath or cough.  Has had slightly worse headache than normal that has improved with Maxalt and Tylenol.  Denies any abdominal pain.  Does have some possible urinary symptoms.  No abdominal tenderness on exam.  Clear breath sounds.  Neurologically she appears at her baseline.  She has chronic weakness in her upper extremities.  Chest x-ray showed no signs of obvious pneumonia.  Urinalysis overall equivocal for infection.  Will treat with Keflex.  Will get urine culture.  Had mild leukocytosis but otherwise no significant anemia, electrolyte abnormality.  Lipase was normal.  Doubt pancreatitis.  Patient felt better following IV fluids, Compazine, Benadryl.  Will give Decadron for further headache relief.  Given return precautions and discharged in the ED in good condition.  Recommend follow-up with primary care doctor.  Coronavirus test has been sent.  Recommend self-isolation at home.  This chart was dictated using voice recognition software.  Despite best efforts to proofread,  errors can occur which can change the documentation meaning.  TYRIKA KOLTZ was evaluated in Emergency Department on 09/05/2019 for the symptoms described in the history of present illness. She was evaluated in the context of the global COVID-19 pandemic, which necessitated consideration that the patient might be at risk for infection with the SARS-CoV-2 virus that causes COVID-19. Institutional protocols and algorithms  that pertain to the evaluation of patients at risk for COVID-19 are  in a state of rapid change based on information released by regulatory bodies including the CDC and federal and state organizations. These policies and algorithms were followed during the patient's care in the ED.    Final Clinical Impressions(s) / ED Diagnoses   Final diagnoses:  None    ED Discharge Orders    None       Lennice Sites, DO 09/05/19 [id number]

## 2019-09-05 NOTE — ED Notes (Signed)
Pt verbalized understanding d/c instructions and quarantine instructions

## 2019-09-05 NOTE — ED Notes (Signed)
Pt reports feeling "sleepy, and cant differentiate pain d/t feeling of tiredness".

## 2019-09-05 NOTE — Discharge Instructions (Addendum)
Self isolate at home until you hear back from your coronavirus test.  Take antibiotics as there is a small suspicion you may have a urinary tract infection.  Continue Tylenol Motrin at home for fever.  Continue hydration.  Please return to the ED if your symptoms worsen.

## 2019-09-05 NOTE — ED Notes (Signed)
ED Provider at bedside. 

## 2019-09-05 NOTE — ED Triage Notes (Addendum)
Headache that started 2 days ago.  Fever 101.1 at home this morning.  Took 1000mg  Tylenol at 5:30am.   Also has a feeling of urgency to void and then does not have much urine.

## 2019-09-06 LAB — URINE CULTURE: Culture: NO GROWTH

## 2019-09-07 ENCOUNTER — Ambulatory Visit: Payer: Medicare Other | Admitting: Physical Therapy

## 2019-09-11 ENCOUNTER — Other Ambulatory Visit: Payer: Self-pay

## 2019-09-11 ENCOUNTER — Ambulatory Visit: Payer: Medicare Other | Admitting: Psychiatry

## 2019-09-11 ENCOUNTER — Encounter: Payer: Self-pay | Admitting: Physical Therapy

## 2019-09-11 ENCOUNTER — Ambulatory Visit: Payer: Medicare Other | Admitting: Physical Therapy

## 2019-09-11 DIAGNOSIS — M542 Cervicalgia: Secondary | ICD-10-CM

## 2019-09-11 DIAGNOSIS — M6281 Muscle weakness (generalized): Secondary | ICD-10-CM

## 2019-09-11 DIAGNOSIS — M5412 Radiculopathy, cervical region: Secondary | ICD-10-CM | POA: Diagnosis not present

## 2019-09-11 NOTE — Therapy (Signed)
[address] [place], [place], [address] Phone: [phone]   Fax:  [phone] Progress Note Reporting Period  07/31/19 to 09/10/19 for the first 10 visits  See note below for Objective Data and Assessment of Progress/Goals.      Physical Therapy Treatment  Patient Details  Name: Lori Alvarez MRN: [id number] Date of Birth: Mar 07, 1961 Referring Provider (PT): Elsner   Encounter Date: 09/11/2019  PT End of Session - 09/11/19 1526    Visit Number  10    Date for PT Re-Evaluation  09/30/19    PT Start Time  1440    PT Stop Time  1535    PT Time Calculation (min)  55 min    Activity Tolerance  Patient tolerated treatment well;Patient limited by pain;Patient limited by fatigue       Past Medical History:  Diagnosis Date  . ALS (amyotrophic lateral sclerosis) ([place])   . Postherpetic neuralgia     Past Surgical History:  Procedure Laterality Date  . APPENDECTOMY    . PARTIAL HYSTERECTOMY      There were no vitals filed for this visit.  Subjective Assessment - 09/11/19 1454    Subjective  Patient reports woke up feeling bad on the 15th, she had a fever and a bad HA, went to the ED on the 17th, all test negatvie except for a UTI.  She reports that she really feels like she went backwards wihtout coming to PT    Currently in Pain?  Yes    Pain Score  6     Pain Location  Neck    Aggravating Factors   the stress of being sick                       [facility] Adult PT Treatment/Exercise - 09/11/19 0001      Neck Exercises: Machines for Strengthening   UBE (Upper Arm Bike)  level 1 x 3 minutes set up assist and then her on own much more help needed today      Neck Exercises: Supine   Other Supine Exercise  ball between knees squeeze and then with bridges, chest press with assist    Other Supine Exercise  feet on ball K2C, trunk rotation, small bridges, isometric abs      Moist  Heat Therapy   Number Minutes Moist Heat  15 Minutes    Moist Heat Location  Cervical      Electrical Stimulation   Electrical Stimulation Location  upper trap and into the cervical area    Electrical Stimulation Action  IFC    Electrical Stimulation Parameters  sitting    Electrical Stimulation Goals  Pain      Manual Therapy   Manual Therapy  Soft tissue mobilization;Manual Traction    Manual therapy comments  gentle stretches to the upper trap and levator    Soft tissue mobilization  cervical and upper trap area, approximation with movement of the fingers and the wrists bilaterally    Manual Traction  occipital release               PT Short Term Goals - 08/14/19 1426      PT SHORT TERM GOAL #1   Title  independent with initial HEP    Status  Achieved        PT Long Term Goals - 09/11/19 1528      PT LONG TERM GOAL #  1   Title  report pain decreased 25%    Status  On-going      PT LONG TERM GOAL #2   Title  report that she can do her hair on her own    Status  On-going      PT LONG TERM GOAL #3   Title  increase right grip strength to 25#    Status  On-going      PT LONG TERM GOAL #4   Title  increase cervical ROM 25%    Status  On-going            Plan - 09/11/19 1527    Clinical Impression Statement  Patient has had a set back, she went to the ED last weekend and was not able to come to PT, she reports that she feels like she has really regressed, she feels weak, has some increased neck and upper trap pain , reports that she feels like she cannot do anything, has more compensation with the upper traps today with less ability to move the arms    PT Next Visit Plan  continue to work on mms and decrease compensation    Consulted and Agree with Plan of Care  Patient       Patient will benefit from skilled therapeutic intervention in order to improve the following deficits and impairments:  Pain, Improper body mechanics, Impaired sensation, Increased  muscle spasms, Postural dysfunction, Impaired tone, Impaired UE functional use, Decreased strength, Decreased range of motion, Decreased activity tolerance, Impaired flexibility  Visit Diagnosis: Muscle weakness (generalized)  Cervicalgia     Problem List Patient Active Problem List   Diagnosis Date Noted  . Collagenous colitis 07/28/2018  . Decreased sensation of lower extremity 07/20/2018  . ALS (amyotrophic lateral sclerosis) ([place]) 12/23/2017  . Muscle weakness (generalized) 11/23/2017  . Bilateral arm weakness 11/11/2017  . Elevated blood pressure reading without diagnosis of hypertension 11/11/2017  . Post herpetic neuralgia 11/11/2017  . MDS (myelodysplastic syndrome) ([place]) 01/22/2015    Sumner Boast., PT 09/11/2019, 3:30 PM  [address] [place], [place], [address] Phone: [phone]   Fax:  [phone]  Name: Lori Alvarez MRN: [id number] Date of Birth: March 29, 1961

## 2019-09-18 ENCOUNTER — Encounter: Payer: Self-pay | Admitting: Physical Therapy

## 2019-09-18 ENCOUNTER — Ambulatory Visit: Payer: Medicare Other | Admitting: Physical Therapy

## 2019-09-18 ENCOUNTER — Other Ambulatory Visit: Payer: Self-pay

## 2019-09-18 DIAGNOSIS — M5412 Radiculopathy, cervical region: Secondary | ICD-10-CM

## 2019-09-18 DIAGNOSIS — M542 Cervicalgia: Secondary | ICD-10-CM | POA: Diagnosis not present

## 2019-09-18 DIAGNOSIS — M6281 Muscle weakness (generalized): Secondary | ICD-10-CM

## 2019-09-18 NOTE — Therapy (Signed)
[place], [place], [address] Phone: [phone]   Fax:  [phone]  Physical Therapy Treatment  Patient Details  Name: Lori Alvarez MRN: [id number] Date of Birth: [birth year] Referring Provider (PT): Elsner   Encounter Date: 09/18/2019  PT End of Session - 09/18/19 1534    Visit Number  11    Date for PT Re-Evaluation  09/30/19    PT Start Time  [id number]    PT Stop Time  [id number]    PT Time Calculation (min)  59 min    Activity Tolerance  Patient tolerated treatment well;Patient limited by pain;Patient limited by fatigue    Behavior During Therapy  [facility] for tasks assessed/performed       Past Medical History:  Diagnosis Date  . ALS (amyotrophic lateral sclerosis) ([place])   . Postherpetic neuralgia     Past Surgical History:  Procedure Laterality Date  . APPENDECTOMY    . PARTIAL HYSTERECTOMY      There were no vitals filed for this visit.  Subjective Assessment - 09/18/19 1530    Subjective  Patient reports that she feels like that week she had to cancel really set her back, I feel so weak and am hurting more    Currently in Pain?  Yes    Pain Score  7     Pain Location  Neck    Aggravating Factors   not having PT and being sick                       [facility] Adult PT Treatment/Exercise - 09/18/19 0001      Neck Exercises: Machines for Strengthening   UBE (Upper Arm Bike)  level 1 x 3 minutes set up assist and then her on own much more help needed today      Neck Exercises: Seated   Other Seated Exercise  biceps with tube and light assist after 5 reps had to switch to eccentrics    Other Seated Exercise  use of marbles, tubes and balls, had her work on picking up and manipulating the items and place in certain things, she required some help to support the elbow to decrease the compensation of the upper traps      Moist Heat Therapy   Number Minutes Moist Heat  15  Minutes    Moist Heat Location  Cervical      Electrical Stimulation   Electrical Stimulation Location  upper trap and into the cervical area    Electrical Stimulation Action  IFC    Electrical Stimulation Parameters  sitting    Electrical Stimulation Goals  Pain      Manual Therapy   Manual Therapy  Soft tissue mobilization;Manual Traction    Manual therapy comments  gentle stretches to the upper trap and levator    Soft tissue mobilization  cervical and upper trap area, approximation with movement of the fingers and the wrists bilaterally    Manual Traction  occipital release               PT Short Term Goals - 08/14/19 1426      PT SHORT TERM GOAL #1   Title  independent with initial HEP    Status  Achieved        PT Long Term Goals - 09/18/19 1536      PT LONG TERM GOAL #1   Title  report pain decreased 25%  Status  On-going      PT LONG TERM GOAL #2   Title  report that she can do her hair on her own    Status  On-going            Plan - 09/18/19 1534    Clinical Impression Statement  Patient reports that she really feels like she had a set back with the illness, reports that she is hurting more and feels very weak, she reports more difficulty using her arms.  I worked on some dexterity and use of the fingers and hands today but needed support at the elbow to decrease the upper trap compensation, she had a lot more tightness and tenderness in the upper traps today    PT Next Visit Plan  work on hands and functional use    Consulted and Agree with Plan of Care  Patient       Patient will benefit from skilled therapeutic intervention in order to improve the following deficits and impairments:  Pain, Improper body mechanics, Impaired sensation, Increased muscle spasms, Postural dysfunction, Impaired tone, Impaired UE functional use, Decreased strength, Decreased range of motion, Decreased activity tolerance, Impaired flexibility  Visit  Diagnosis: Muscle weakness (generalized)  Cervicalgia  Radiculopathy, cervical region     Problem List Patient Active Problem List   Diagnosis Date Noted  . Collagenous colitis 07/28/2018  . Decreased sensation of lower extremity 07/20/2018  . ALS (amyotrophic lateral sclerosis) ([place]) 12/23/2017  . Muscle weakness (generalized) 11/23/2017  . Bilateral arm weakness 11/11/2017  . Elevated blood pressure reading without diagnosis of hypertension 11/11/2017  . Post herpetic neuralgia 11/11/2017  . MDS (myelodysplastic syndrome) ([place]) 01/22/2015    Sumner Boast., PT 09/18/2019, 3:37 PM  [place] [address] [place], [place], [address] Phone: [phone]   Fax:  [phone]  Name: Lori Alvarez MRN: [id number] Date of Birth: 10-11-1961

## 2019-09-20 ENCOUNTER — Ambulatory Visit (INDEPENDENT_AMBULATORY_CARE_PROVIDER_SITE_OTHER): Payer: Medicare Other | Admitting: Psychiatry

## 2019-09-20 ENCOUNTER — Other Ambulatory Visit: Payer: Self-pay

## 2019-09-20 ENCOUNTER — Ambulatory Visit: Payer: Medicare Other | Attending: Neurological Surgery | Admitting: Physical Therapy

## 2019-09-20 ENCOUNTER — Encounter: Payer: Self-pay | Admitting: Physical Therapy

## 2019-09-20 DIAGNOSIS — F321 Major depressive disorder, single episode, moderate: Secondary | ICD-10-CM

## 2019-09-20 DIAGNOSIS — M542 Cervicalgia: Secondary | ICD-10-CM

## 2019-09-20 DIAGNOSIS — M5412 Radiculopathy, cervical region: Secondary | ICD-10-CM | POA: Diagnosis present

## 2019-09-20 DIAGNOSIS — M6281 Muscle weakness (generalized): Secondary | ICD-10-CM | POA: Insufficient documentation

## 2019-09-20 NOTE — Progress Notes (Signed)
[facility] Counselor/Therapist Progress Note  Patient ID: Lori Alvarez, MRN: [id number],    Date: 09/20/2019  Time Spent: 60 minutes  11:00am to 12:00noon  Virtual Visit Note Connected with patient by a video enabled telemedicine/telehealth application or telephone, with their informed consent, and verified patient privacy and that I am speaking with the correct person using two identifiers. I discussed the limitations, risks, security and privacy concerns of performing psychotherapy and management service by telephone and the availability of in person appointments. I also discussed with the patient that there may be a patient responsible charge related to this service. The patient expressed understanding and agreed to proceed. I discussed the treatment planning with the patient. The patient was provided an opportunity to ask questions and all were answered. The patient agreed with the plan and demonstrated an understanding of the instructions. The patient was advised to call  our office if  symptoms worsen or feel they are in a crisis state and need immediate contact.   Therapist Location: [facility] Patient Location: home  Treatment Type: Individual Therapy  Reported Symptoms: depression, discouraged re: her ALS progression   Mental Status Exam:  Appearance:   n/a   telehealth     Behavior:  Sharing   Motor:  n/a    telehealth  Speech/Language:   Clear and Coherent  Affect:   n/a   telehealth  Mood:  anxious and depressed  Thought process:  goal directed  Thought content:    WNL  Sensory/Perceptual disturbances:    WNL  Orientation:  oriented to person, place, time/date, situation, day of week, month of year and year  Attention:  Good  Concentration:  Good  Memory:  WNL  Fund of knowledge:   Good  Insight:    Good  Judgment:   Good  Impulse Control:  Good   Risk Assessment: Danger to Self:  No Self-injurious Behavior: No Danger to Others: No Duty  to Warn:no Physical Aggression / Violence:No  Access to Firearms a concern: No  Gang Involvement:No   Subjective:  Patient reporting some sliding back some physically and emotionally.  ALS symptoms kept her from her PT appts more recently and she felt that set her back some. Is back on the PT schedule in next few weeks.  Interventions: Solution-Oriented/Positive Psychology and Ego-Supportive  Diagnosis:   ICD-10-CM   1. Moderate major depression, single episode ([place])  F32.1      Plan: Patient is not signing tx plan on a computer screen due to [place].  Treatment Goals: Goals may remain here in tx plan as patient works on strategies to meet her goals. Some changes made in goals today to better meet patient needs.  Long term goal: Develop the ability to recognize, accept, and cope with feelings of depression.  Short term goal: Learn and implement calming skills to reduce overall tension and moments of increased depression and anxiety.  Strategies: 1) Identify cognitive self-talk that supports depression. Work to interrupt those self-talk messages, and eventually replace it with self-talk that patient feels is more supportive to her in managing her ALS symptoms and progression. 2) Encourage patient's open, full expression of her depression and anxiety as she navigates her journey with advancing ALS.  PROGRESS: Patient continues to work on her goals and especially increasing her more motivating, self-affirming, positive self-talk. She worked on this last session and is needing to follow up with more emphasis on this.  Does well at times and seems to  identify any negative self-talk more quickly, and finds it more difficult during setbacks.  Processing some ongoing fears and uncertainty related to her ALS progression which seems to help at each session, and I encourage her to be as open as possible as it helps her build strength and resilience emotionally.  Goal review and progress  noted with patient.   Next appt within 3-4 weeks.   Shanon Ace, LCSW

## 2019-09-20 NOTE — Therapy (Signed)
[place], [place], [address] Phone: [phone]   Fax:  [phone]  Physical Therapy Treatment  Patient Details  Name: AOI GODETTE MRN: [id number] Date of Birth: [birth year] Referring Provider (PT): Elsner   Encounter Date: 09/20/2019  PT End of Session - 09/20/19 [id number]    Visit Number  12    Date for PT Re-Evaluation  09/30/19    PT Start Time  [id number]    PT Stop Time  1540    PT Time Calculation (min)  58 min    Activity Tolerance  Patient tolerated treatment well;Patient limited by pain;Patient limited by fatigue    Behavior During Therapy  [facility] for tasks assessed/performed       Past Medical History:  Diagnosis Date  . ALS (amyotrophic lateral sclerosis) ([place])   . Postherpetic neuralgia     Past Surgical History:  Procedure Laterality Date  . APPENDECTOMY    . PARTIAL HYSTERECTOMY      There were no vitals filed for this visit.  Subjective Assessment - 09/20/19 1446    Subjective  Patient reports that she has been really sore in the biceps reports 10/10 pain.  She is unsure of why the biceps pain.    Currently in Pain?  Yes    Pain Score  6     Pain Location  Neck                       [facility] Adult PT Treatment/Exercise - 09/20/19 0001      Neck Exercises: Machines for Strengthening   Other Machines for Strengthening  leg extension 5# 2x 10, leg flexion 20#  2x 10, leg press 20# x 8, no weight x 8      Neck Exercises: Seated   Other Seated Exercise  yellow tband triceps 3x5      Neck Exercises: Supine   Other Supine Exercise  ball between knees squeeze and then with bridges, chest press with assist, SLR x10 each side    Other Supine Exercise  feet on ball K2C, trunk rotation, small bridges, isometric abs      Moist Heat Therapy   Number Minutes Moist Heat  15 Minutes    Moist Heat Location  Cervical      Electrical Stimulation   Electrical Stimulation  Location  upper trap and into the cervical area    Electrical Stimulation Action  IFC    Electrical Stimulation Parameters  sitting    Electrical Stimulation Goals  Pain      Manual Therapy   Manual Therapy  Soft tissue mobilization;Manual Traction    Soft tissue mobilization  cervical and upper trap area, approximation with movement of the fingers and the wrists bilaterally               PT Short Term Goals - 08/14/19 1426      PT SHORT TERM GOAL #1   Title  independent with initial HEP    Status  Achieved        PT Long Term Goals - 09/18/19 1536      PT LONG TERM GOAL #1   Title  report pain decreased 25%    Status  On-going      PT LONG TERM GOAL #2   Title  report that she can do her hair on her own    Status  On-going  Plan - 09/20/19 1533    Clinical Impression Statement  Patient c/o a lot more pain especially inthe biceps, she is tender in the biceps as well, she did seem to have less tension in the upper traps a little more tightness and tender in the neck area    PT Next Visit Plan  see if we can decrease the biceps pain    Consulted and Agree with Plan of Care  Patient       Patient will benefit from skilled therapeutic intervention in order to improve the following deficits and impairments:  Pain, Improper body mechanics, Impaired sensation, Increased muscle spasms, Postural dysfunction, Impaired tone, Impaired UE functional use, Decreased strength, Decreased range of motion, Decreased activity tolerance, Impaired flexibility  Visit Diagnosis: Muscle weakness (generalized)  Cervicalgia  Radiculopathy, cervical region     Problem List Patient Active Problem List   Diagnosis Date Noted  . Collagenous colitis 07/28/2018  . Decreased sensation of lower extremity 07/20/2018  . ALS (amyotrophic lateral sclerosis) ([place]) 12/23/2017  . Muscle weakness (generalized) 11/23/2017  . Bilateral arm weakness 11/11/2017  . Elevated blood  pressure reading without diagnosis of hypertension 11/11/2017  . Post herpetic neuralgia 11/11/2017  . MDS (myelodysplastic syndrome) ([place]) 01/22/2015    Sumner Boast., PT 09/20/2019, 3:34 PM  [place] [place], [place], [address] Phone: [phone]   Fax:  [phone]  Name: RONELLE STACHE MRN: [id number] Date of Birth: [birth year]

## 2019-09-25 ENCOUNTER — Other Ambulatory Visit: Payer: Self-pay

## 2019-09-25 ENCOUNTER — Ambulatory Visit: Payer: Medicare Other | Admitting: Physical Therapy

## 2019-09-25 ENCOUNTER — Encounter: Payer: Self-pay | Admitting: Physical Therapy

## 2019-09-25 DIAGNOSIS — M6281 Muscle weakness (generalized): Secondary | ICD-10-CM | POA: Diagnosis not present

## 2019-09-25 DIAGNOSIS — M5412 Radiculopathy, cervical region: Secondary | ICD-10-CM

## 2019-09-25 DIAGNOSIS — M542 Cervicalgia: Secondary | ICD-10-CM

## 2019-09-25 NOTE — Therapy (Signed)
[place] [address] [place], [place], [address] Phone: [phone]   Fax:  [phone]  Physical Therapy Treatment  Patient Details  Name: WALTINA CALLENS MRN: [id number] Date of Birth: 1961-05-06 Referring Provider (PT): Elsner   Encounter Date: 09/25/2019  PT End of Session - 09/25/19 [id number]    Visit Number  13    Date for PT Re-Evaluation  09/30/19    PT Start Time  [id number]    PT Stop Time  1547    PT Time Calculation (min)  64 min    Activity Tolerance  Patient tolerated treatment well;Patient limited by pain;Patient limited by fatigue    Behavior During Therapy  [facility] for tasks assessed/performed       Past Medical History:  Diagnosis Date  . ALS (amyotrophic lateral sclerosis) ([place])   . Postherpetic neuralgia     Past Surgical History:  Procedure Laterality Date  . APPENDECTOMY    . PARTIAL HYSTERECTOMY      There were no vitals filed for this visit.  Subjective Assessment - 09/25/19 1442    Subjective  Patient reports that she was a little better after the last treatment, reports feels very weak especially in the neck    Currently in Pain?  Yes    Pain Score  5     Pain Location  Neck    Aggravating Factors   brushing teeth                       [facility] Adult PT Treatment/Exercise - 09/25/19 0001      High Level Balance   High Level Balance Activities  Tandem walking    High Level Balance Comments  resisted gait all directions, soccer kicks      Neck Exercises: Standing   Other Standing Exercises  used the computer cart and had her push and pull 10 x with each arm working on hand, elbow and shoulder use as well as trunk stability      Neck Exercises: Seated   Other Seated Exercise  isometric abs with physioball in lap      Neck Exercises: Supine   Neck Retraction  20 reps;3 secs    Neck Retraction Limitations  head on ball      Moist Heat Therapy   Number Minutes Moist  Heat  15 Minutes    Moist Heat Location  Cervical      Electrical Stimulation   Electrical Stimulation Location  upper trap and into the cervical area    Electrical Stimulation Action  IFC    Electrical Stimulation Parameters  sitting    Electrical Stimulation Goals  Pain      Manual Therapy   Manual Therapy  Soft tissue mobilization;Manual Traction    Manual therapy comments  gentle stretches to the upper trap and levator    Soft tissue mobilization  cervical and upper trap area, approximation with movement of the fingers and the wrists bilaterally    Manual Traction  occipital release, shoulder depression               PT Short Term Goals - 08/14/19 1426      PT SHORT TERM GOAL #1   Title  independent with initial HEP    Status  Achieved        PT Long Term Goals - 09/18/19 1536      PT LONG TERM GOAL #1   Title  report pain decreased 25%    Status  On-going      PT LONG TERM GOAL #2   Title  report that she can do her hair on her own    Status  On-going            Plan - 09/25/19 1533    Clinical Impression Statement  patient did well with the balance and core acitvities, she has the most diffciulty with the use of the arms and then with the core to get up from supine due to weakness, we have tried various ways to help engage the core in supine, sitting and standing, she is having less spasms and knots in the upper traps and more in the neck    PT Next Visit Plan  work on core and function    Consulted and Agree with Plan of Care  Patient       Patient will benefit from skilled therapeutic intervention in order to improve the following deficits and impairments:  Pain, Improper body mechanics, Impaired sensation, Increased muscle spasms, Postural dysfunction, Impaired tone, Impaired UE functional use, Decreased strength, Decreased range of motion, Decreased activity tolerance, Impaired flexibility  Visit Diagnosis: Muscle weakness  (generalized)  Cervicalgia  Radiculopathy, cervical region     Problem List Patient Active Problem List   Diagnosis Date Noted  . Collagenous colitis 07/28/2018  . Decreased sensation of lower extremity 07/20/2018  . ALS (amyotrophic lateral sclerosis) ([place]) 12/23/2017  . Muscle weakness (generalized) 11/23/2017  . Bilateral arm weakness 11/11/2017  . Elevated blood pressure reading without diagnosis of hypertension 11/11/2017  . Post herpetic neuralgia 11/11/2017  . MDS (myelodysplastic syndrome) ([place]) 01/22/2015    Sumner Boast., PT 09/25/2019, 3:35 PM  [place] [address] [place], [place], [address] Phone: [phone]   Fax:  [phone]  Name: DEVITA HEWATT MRN: [id number] Date of Birth: January 02, 1961

## 2019-09-27 ENCOUNTER — Other Ambulatory Visit: Payer: Self-pay

## 2019-09-27 ENCOUNTER — Ambulatory Visit: Payer: Medicare Other | Admitting: Physical Therapy

## 2019-09-27 ENCOUNTER — Encounter: Payer: Self-pay | Admitting: Physical Therapy

## 2019-09-27 DIAGNOSIS — M5412 Radiculopathy, cervical region: Secondary | ICD-10-CM

## 2019-09-27 DIAGNOSIS — M542 Cervicalgia: Secondary | ICD-10-CM

## 2019-09-27 DIAGNOSIS — M6281 Muscle weakness (generalized): Secondary | ICD-10-CM

## 2019-09-27 NOTE — Therapy (Signed)
[place], [place], [address] Phone: [phone]   Fax:  [phone]  Physical Therapy Treatment  Patient Details  Name: DONDREA CAVINESS MRN: [id number] Date of Birth: [birth year] Referring Provider (PT): Elsner   Encounter Date: 09/27/2019  PT End of Session - 09/27/19 1541    Visit Number  14    Date for PT Re-Evaluation  09/30/19    PT Start Time  [id number]    PT Stop Time  1546    PT Time Calculation (min)  63 min    Activity Tolerance  Patient tolerated treatment well;Patient limited by pain;Patient limited by fatigue    Behavior During Therapy  [facility] for tasks assessed/performed       Past Medical History:  Diagnosis Date  . ALS (amyotrophic lateral sclerosis) ([place])   . Postherpetic neuralgia     Past Surgical History:  Procedure Laterality Date  . APPENDECTOMY    . PARTIAL HYSTERECTOMY      There were no vitals filed for this visit.  Subjective Assessment - 09/27/19 1528    Subjective  c/o pain in the neck and the biceps, less pain i nthe upper traps    Currently in Pain?  Yes    Pain Score  6     Pain Location  Neck    Pain Relieving Factors  "you pulling on my head helps"                       [facility] Adult PT Treatment/Exercise - 09/27/19 0001      Moist Heat Therapy   Number Minutes Moist Heat  15 Minutes    Moist Heat Location  Cervical      Electrical Stimulation   Electrical Stimulation Location  upper trap and into the cervical area    Electrical Stimulation Action  IFC    Electrical Stimulation Parameters  siting    Electrical Stimulation Goals  Pain      Manual Therapy   Manual Therapy  Soft tissue mobilization;Manual Traction    Manual therapy comments  gentle stretches to the upper trap and levator    Soft tissue mobilization  cervical area, upper traps and into the biceps    Manual Traction  occipital release, shoulder depression                PT Short Term Goals - 08/14/19 1426      PT SHORT TERM GOAL #1   Title  independent with initial HEP    Status  Achieved        PT Long Term Goals - 09/27/19 1550      PT LONG TERM GOAL #1   Title  report pain decreased 25%    Status  On-going            Plan - 09/27/19 1548    Clinical Impression Statement  Patient having less spasm and pain in the upper traps but now seems to feel more in the neck and the biceps, she does have knots in the biceps and the deltoid area, she is tender here.  She reports that she feels like the PT has helped and she worries that she would be worse off without PT    PT Next Visit Plan  work on core and function    Consulted and Agree with Plan of Care  Patient       Patient will benefit from  skilled therapeutic intervention in order to improve the following deficits and impairments:  Pain, Improper body mechanics, Impaired sensation, Increased muscle spasms, Postural dysfunction, Impaired tone, Impaired UE functional use, Decreased strength, Decreased range of motion, Decreased activity tolerance, Impaired flexibility  Visit Diagnosis: Muscle weakness (generalized)  Cervicalgia  Radiculopathy, cervical region     Problem List Patient Active Problem List   Diagnosis Date Noted  . Collagenous colitis 07/28/2018  . Decreased sensation of lower extremity 07/20/2018  . ALS (amyotrophic lateral sclerosis) ([place]) 12/23/2017  . Muscle weakness (generalized) 11/23/2017  . Bilateral arm weakness 11/11/2017  . Elevated blood pressure reading without diagnosis of hypertension 11/11/2017  . Post herpetic neuralgia 11/11/2017  . MDS (myelodysplastic syndrome) ([place]) 01/22/2015    Sumner Boast., PT 09/27/2019, 3:54 PM  [place] [address] [place], [place], [address] Phone: [phone]   Fax:  [phone]  Name: VIVYANA WILDFONG MRN:  [id number] Date of Birth: July 12, 1961

## 2019-10-02 ENCOUNTER — Ambulatory Visit: Payer: Medicare Other | Admitting: Physical Therapy

## 2019-10-02 ENCOUNTER — Encounter: Payer: Self-pay | Admitting: Physical Therapy

## 2019-10-02 ENCOUNTER — Other Ambulatory Visit: Payer: Self-pay

## 2019-10-02 DIAGNOSIS — M5412 Radiculopathy, cervical region: Secondary | ICD-10-CM

## 2019-10-02 DIAGNOSIS — M542 Cervicalgia: Secondary | ICD-10-CM

## 2019-10-02 DIAGNOSIS — M6281 Muscle weakness (generalized): Secondary | ICD-10-CM

## 2019-10-02 NOTE — Therapy (Signed)
[place] [address] [place], [place], [address] Phone: [phone]   Fax:  [phone]  Physical Therapy Treatment  Patient Details  Name: SHOSHANAH AMOAH MRN: [id number] Date of Birth: 11-14-1960 Referring Provider (PT): Elsner   Encounter Date: 10/02/2019  PT End of Session - 10/02/19 [id number]    Visit Number  15    Date for PT Re-Evaluation  11/02/19    PT Start Time  1525    PT Stop Time  1616    PT Time Calculation (min)  51 min    Activity Tolerance  Patient tolerated treatment well;Patient limited by pain;Patient limited by fatigue    Behavior During Therapy  [facility] for tasks assessed/performed       Past Medical History:  Diagnosis Date  . ALS (amyotrophic lateral sclerosis) ([place])   . Postherpetic neuralgia     Past Surgical History:  Procedure Laterality Date  . APPENDECTOMY    . PARTIAL HYSTERECTOMY      There were no vitals filed for this visit.  Subjective Assessment - 10/02/19 1534    Subjective  I am resigned to the fact that I am going to hurt but I think this helps some.    Currently in Pain?  Yes    Pain Score  6     Pain Location  Neck    Aggravating Factors   activity         [facility] PT Assessment - 10/02/19 0001      Assessment   Medical Diagnosis  cervical stenosis    Referring Provider (PT)  Elsner                   [facility] Adult PT Treatment/Exercise - 10/02/19 0001      Neck Exercises: Machines for Strengthening   Other Machines for Strengthening  leg extension 5# 2x 10, leg flexion 20#  2x 10, leg press 20# x 8, no weight x 8      Neck Exercises: Supine   Other Supine Exercise  ball squeeze with bridge, clamshell withred tband with bridge    Other Supine Exercise  did a lot of work on TA and trying to get posterior pelvic tilt to engage core      Moist Heat Therapy   Number Minutes Moist Heat  15 Minutes    Moist Heat Location  Cervical      Electrical  Stimulation   Electrical Stimulation Location  upper trap and into the cervical area    Electrical Stimulation Action  IFC    Electrical Stimulation Parameters  sitting    Electrical Stimulation Goals  Pain      Manual Therapy   Manual Therapy  Soft tissue mobilization;Manual Traction    Manual therapy comments  gentle stretches to the upper trap and levator    Soft tissue mobilization  cervical area, upper traps and into the biceps    Manual Traction  occipital release, shoulder depression               PT Short Term Goals - 08/14/19 1426      PT SHORT TERM GOAL #1   Title  independent with initial HEP    Status  Achieved        PT Long Term Goals - 10/02/19 1616      PT LONG TERM GOAL #1   Title  report pain decreased 25%    Status  On-going  PT LONG TERM GOAL #2   Title  report that she can do her hair on her own    Status  On-going            Plan - 10/02/19 1614    Clinical Impression Statement  Patient is really having increased difficulty with her arms, she had a lot more shaking with any movements.  I tried to work more on her core today and her legs, needed a lot of verbal and tactile cues to get PPT and encage the core    PT Next Visit Plan  work on core and function    Consulted and Agree with Plan of Care  Patient       Patient will benefit from skilled therapeutic intervention in order to improve the following deficits and impairments:  Pain, Improper body mechanics, Impaired sensation, Increased muscle spasms, Postural dysfunction, Impaired tone, Impaired UE functional use, Decreased strength, Decreased range of motion, Decreased activity tolerance, Impaired flexibility  Visit Diagnosis: Muscle weakness (generalized) - Plan: PT plan of care cert/re-cert  Cervicalgia - Plan: PT plan of care cert/re-cert  Radiculopathy, cervical region - Plan: PT plan of care cert/re-cert     Problem List Patient Active Problem List   Diagnosis Date  Noted  . Collagenous colitis 07/28/2018  . Decreased sensation of lower extremity 07/20/2018  . ALS (amyotrophic lateral sclerosis) ([place]) 12/23/2017  . Muscle weakness (generalized) 11/23/2017  . Bilateral arm weakness 11/11/2017  . Elevated blood pressure reading without diagnosis of hypertension 11/11/2017  . Post herpetic neuralgia 11/11/2017  . MDS (myelodysplastic syndrome) ([place]) 01/22/2015    Sumner Boast., PT 10/02/2019, 4:18 PM  [address] [place], [place], [address] Phone: [phone]   Fax:  [phone]  Name: GAVYN SCIBETTA MRN: [id number] Date of Birth: [birth year]

## 2019-10-04 ENCOUNTER — Encounter: Payer: Self-pay | Admitting: Physical Therapy

## 2019-10-04 ENCOUNTER — Other Ambulatory Visit: Payer: Self-pay

## 2019-10-04 ENCOUNTER — Ambulatory Visit: Payer: Medicare Other | Admitting: Physical Therapy

## 2019-10-04 DIAGNOSIS — M6281 Muscle weakness (generalized): Secondary | ICD-10-CM

## 2019-10-04 DIAGNOSIS — M542 Cervicalgia: Secondary | ICD-10-CM

## 2019-10-04 DIAGNOSIS — M5412 Radiculopathy, cervical region: Secondary | ICD-10-CM

## 2019-10-04 NOTE — Therapy (Signed)
[place], [place], [address] Phone: [phone]   Fax:  [phone]  Physical Therapy Treatment  Patient Details  Name: Lori Alvarez MRN: [id number] Date of Birth: [birth year] Referring Provider (PT): Elsner   Encounter Date: 10/04/2019  PT End of Session - 10/04/19 1534    Visit Number  16    Date for PT Re-Evaluation  11/02/19    PT Start Time  9628    PT Stop Time  1540    PT Time Calculation (min)  57 min    Activity Tolerance  Patient tolerated treatment well;Patient limited by fatigue       Past Medical History:  Diagnosis Date  . ALS (amyotrophic lateral sclerosis) ([place])   . Postherpetic neuralgia     Past Surgical History:  Procedure Laterality Date  . APPENDECTOMY    . PARTIAL HYSTERECTOMY      There were no vitals filed for this visit.  Subjective Assessment - 10/04/19 1448    Subjective  Patient reports that she is feeling a little better today.   Less pain    Currently in Pain?  Yes    Pain Score  3     Pain Location  Neck    Pain Relieving Factors  The treatment helps                       [facility] Adult PT Treatment/Exercise - 10/04/19 0001      Neck Exercises: Machines for Strengthening   Other Machines for Strengthening  leg extension 5# 2x 10, leg flexion 20#  2x 10, leg press 20# x 8, no weight x 8      Neck Exercises: Standing   Other Standing Exercises  used the computer cart and had her push and pull 10 x with each arm working on hand, elbow and shoulder use as well as trunk stability      Neck Exercises: Seated   Other Seated Exercise  isometric abs with physioball in lap    Other Seated Exercise  ball b/n knees squeeze, biceps with pool noodle      Neck Exercises: Supine   Other Supine Exercise  did a lot of work on TA and trying to get posterior pelvic tilt to engage core      Moist Heat Therapy   Number Minutes Moist Heat  10  Minutes    Moist Heat Location  Cervical      Electrical Stimulation   Electrical Stimulation Location  upper trap and into the cervical area    Electrical Stimulation Action  IFC    Electrical Stimulation Parameters  sitting    Electrical Stimulation Goals  Pain      Manual Therapy   Manual Therapy  Soft tissue mobilization;Manual Traction    Manual therapy comments  gentle stretches to the upper trap and levator    Soft tissue mobilization  cervical area, upper traps and into the biceps    Manual Traction  occipital release, shoulder depression               PT Short Term Goals - 08/14/19 1426      PT SHORT TERM GOAL #1   Title  independent with initial HEP    Status  Achieved        PT Long Term Goals - 10/04/19 1536      PT LONG TERM GOAL #1   Title  report pain decreased 25%    Status  Partially Met            Plan - 10/04/19 1535    Clinical Impression Statement  Patient did better today with less pain, still pain in the biceps, very tight in the side of the neck mms and in the rhomboids, she continues to need cues to relax the shoulders and not elevate    PT Next Visit Plan  work on core and function    Consulted and Agree with Plan of Care  Patient       Patient will benefit from skilled therapeutic intervention in order to improve the following deficits and impairments:  Pain, Improper body mechanics, Impaired sensation, Increased muscle spasms, Postural dysfunction, Impaired tone, Impaired UE functional use, Decreased strength, Decreased range of motion, Decreased activity tolerance, Impaired flexibility  Visit Diagnosis: Muscle weakness (generalized)  Cervicalgia  Radiculopathy, cervical region     Problem List Patient Active Problem List   Diagnosis Date Noted  . Collagenous colitis 07/28/2018  . Decreased sensation of lower extremity 07/20/2018  . ALS (amyotrophic lateral sclerosis) ([place]) 12/23/2017  . Muscle weakness (generalized)  11/23/2017  . Bilateral arm weakness 11/11/2017  . Elevated blood pressure reading without diagnosis of hypertension 11/11/2017  . Post herpetic neuralgia 11/11/2017  . MDS (myelodysplastic syndrome) ([place]) 01/22/2015    Sumner Boast., PT 10/04/2019, 3:36 PM  [address] [address] [place], [place], [address] Phone: [phone]   Fax:  [phone]  Name: Lori Alvarez MRN: [id number] Date of Birth: May 06, 1961

## 2019-10-05 ENCOUNTER — Encounter: Payer: Medicare Other | Admitting: Physical Therapy

## 2019-10-09 ENCOUNTER — Ambulatory Visit: Payer: Medicare Other | Admitting: Physical Therapy

## 2019-10-09 ENCOUNTER — Encounter: Payer: Self-pay | Admitting: Physical Therapy

## 2019-10-09 ENCOUNTER — Other Ambulatory Visit: Payer: Self-pay

## 2019-10-09 DIAGNOSIS — M6281 Muscle weakness (generalized): Secondary | ICD-10-CM

## 2019-10-09 DIAGNOSIS — M542 Cervicalgia: Secondary | ICD-10-CM

## 2019-10-09 DIAGNOSIS — M5412 Radiculopathy, cervical region: Secondary | ICD-10-CM

## 2019-10-09 NOTE — Therapy (Signed)
[place] [place], [place], [address] Phone: [phone]   Fax:  [phone]  Physical Therapy Treatment  Patient Details  Name: Lori Alvarez MRN: [id number] Date of Birth: 09-Jul-1961 Referring Provider (PT): Elsner   Encounter Date: 10/09/2019  PT End of Session - 10/09/19 1614    Visit Number  17    Date for PT Re-Evaluation  11/02/19    PT Start Time  2707    PT Stop Time  1621    PT Time Calculation (min)  58 min    Activity Tolerance  Patient tolerated treatment well;Patient limited by fatigue    Behavior During Therapy  [facility] for tasks assessed/performed       Past Medical History:  Diagnosis Date  . ALS (amyotrophic lateral sclerosis) ([place])   . Postherpetic neuralgia     Past Surgical History:  Procedure Laterality Date  . APPENDECTOMY    . PARTIAL HYSTERECTOMY      There were no vitals filed for this visit.  Subjective Assessment - 10/09/19 1530    Subjective  Patient reports that she feels like she had about 3 days where she was feeling a little better    Currently in Pain?  Yes    Pain Location  Neck    Pain Relieving Factors  the treatment                       [facility] Adult PT Treatment/Exercise - 10/09/19 0001      Neck Exercises: Standing   Other Standing Exercises  used the computer cart and had her push and pull 10 x with each arm working on hand, elbow and shoulder use as well as trunk stability      Neck Exercises: Seated   Other Seated Exercise  isometric abs with physioball in lap    Other Seated Exercise  ball b/n knees squeeze, biceps with pool noodle, clams in supine with green tband, small bridges      Neck Exercises: Supine   Other Supine Exercise  did a lot of work on TA and trying to get posterior pelvic tilt to engage core      Moist Heat Therapy   Number Minutes Moist Heat  10 Minutes    Moist Heat Location  Cervical      Electrical  Stimulation   Electrical Stimulation Location  upper trap and into the cervical area    Electrical Stimulation Action  IFC    Electrical Stimulation Parameters  sitting    Electrical Stimulation Goals  Pain      Manual Therapy   Manual Therapy  Soft tissue mobilization;Manual Traction    Manual therapy comments  gentle stretches to the upper trap and levator    Soft tissue mobilization  cervical area, upper traps and into the biceps    Manual Traction  occipital release, shoulder depression               PT Short Term Goals - 08/14/19 1426      PT SHORT TERM GOAL #1   Title  independent with initial HEP    Status  Achieved        PT Long Term Goals - 10/04/19 1536      PT LONG TERM GOAL #1   Title  report pain decreased 25%    Status  Partially Met  Plan - 10/09/19 1615    Clinical Impression Statement  Patient reported that she has been feeling better and having less pain.  She had difficulty recalling the PPT, required manual assist, verbal and tactile cues to perform.  I had her distracted doing something else and then went back to it and she was able to do with a few cues.  Having less biceps pain    PT Next Visit Plan  continue to work on function andher pain levels    Consulted and Agree with Plan of Care  Patient       Patient will benefit from skilled therapeutic intervention in order to improve the following deficits and impairments:  Pain, Improper body mechanics, Impaired sensation, Increased muscle spasms, Postural dysfunction, Impaired tone, Impaired UE functional use, Decreased strength, Decreased range of motion, Decreased activity tolerance, Impaired flexibility  Visit Diagnosis: Muscle weakness (generalized)  Cervicalgia  Radiculopathy, cervical region     Problem List Patient Active Problem List   Diagnosis Date Noted  . Collagenous colitis 07/28/2018  . Decreased sensation of lower extremity 07/20/2018  . ALS  (amyotrophic lateral sclerosis) ([place]) 12/23/2017  . Muscle weakness (generalized) 11/23/2017  . Bilateral arm weakness 11/11/2017  . Elevated blood pressure reading without diagnosis of hypertension 11/11/2017  . Post herpetic neuralgia 11/11/2017  . MDS (myelodysplastic syndrome) ([place]) 01/22/2015    Sumner Boast., PT 10/09/2019, 4:17 PM  [address] [place], [place], [address] Phone: [phone]   Fax:  [phone]  Name: Lori Alvarez MRN: [id number] Date of Birth: 10-25-1960

## 2019-10-11 ENCOUNTER — Encounter (INDEPENDENT_AMBULATORY_CARE_PROVIDER_SITE_OTHER): Payer: Self-pay

## 2019-10-11 ENCOUNTER — Ambulatory Visit: Payer: Medicare Other | Admitting: Psychiatry

## 2019-10-11 ENCOUNTER — Other Ambulatory Visit: Payer: Self-pay

## 2019-10-11 ENCOUNTER — Ambulatory Visit: Payer: Medicare Other | Admitting: Physical Therapy

## 2019-10-11 ENCOUNTER — Encounter: Payer: Self-pay | Admitting: Physical Therapy

## 2019-10-11 DIAGNOSIS — M6281 Muscle weakness (generalized): Secondary | ICD-10-CM

## 2019-10-11 DIAGNOSIS — M5412 Radiculopathy, cervical region: Secondary | ICD-10-CM

## 2019-10-11 DIAGNOSIS — M542 Cervicalgia: Secondary | ICD-10-CM

## 2019-10-11 NOTE — Therapy (Signed)
[place], [place], [address] Phone: [phone]   Fax:  [phone]  Physical Therapy Treatment  Patient Details  Name: Lori Alvarez MRN: [id number] Date of Birth: September 04, 1961 Referring Provider (PT): Elsner   Encounter Date: 10/11/2019  PT End of Session - 10/11/19 1648    Visit Number  18    Date for PT Re-Evaluation  11/02/19    PT Start Time  5170    PT Stop Time  1535    PT Time Calculation (min)  59 min    Activity Tolerance  Patient tolerated treatment well;Patient limited by pain    Behavior During Therapy  [facility] for tasks assessed/performed       Past Medical History:  Diagnosis Date  . ALS (amyotrophic lateral sclerosis) ([place])   . Postherpetic neuralgia     Past Surgical History:  Procedure Laterality Date  . APPENDECTOMY    . PARTIAL HYSTERECTOMY      There were no vitals filed for this visit.  Subjective Assessment - 10/11/19 1623    Subjective  Patient reports that she had an incident this AM where she could not get off the couch, reports that she decided that maybe she could get up from tthe floor, but had a great deal of difficulty with this and her husband strained his back helping her    Currently in Pain?  Yes    Pain Score  6     Pain Location  Neck    Aggravating Factors   stress of today when I could not get up                       [facility] Adult PT Treatment/Exercise - 10/11/19 0001      Therapeutic Activites    Therapeutic Activities  Other Therapeutic Activities    Other Therapeutic Activities  I worked a lot with her today on the low sitting transfers, how to use momentum and "nose over toes" cues.  I also demonstrated her role and her husbands role in some low seated transfers as well as the getting up from the floor activity, one issue is she reports her husband was pulling on her arms and this hurt.      Neck Exercises: Machines for  Strengthening   Other Machines for Strengthening  leg extension 5# 2x 10, leg flexion 20#  2x 10, leg press 20# x 8, no weight x 8      Moist Heat Therapy   Number Minutes Moist Heat  10 Minutes    Moist Heat Location  Cervical      Electrical Stimulation   Electrical Stimulation Location  upper trap and into the cervical area    Electrical Stimulation Action  IFC    Electrical Stimulation Parameters  sitting    Electrical Stimulation Goals  Pain      Manual Therapy   Manual Therapy  Soft tissue mobilization;Manual Traction    Manual therapy comments  gentle stretches to the upper trap and levator    Soft tissue mobilization  cervical area, upper traps and into the biceps    Manual Traction  occipital release, shoulder depression               PT Short Term Goals - 08/14/19 1426      PT SHORT TERM GOAL #1   Title  independent with initial HEP    Status  Achieved  PT Long Term Goals - 10/11/19 1650      PT LONG TERM GOAL #1   Title  report pain decreased 25%    Status  Partially Met      PT LONG TERM GOAL #3   Title  increase right grip strength to 25#    Status  On-going      PT LONG TERM GOAL #4   Title  increase cervical ROM 25%    Status  Partially Met            Plan - 10/11/19 1648    Clinical Impression Statement  Patient reports a lot of issues today trying to get up from her couch, she report it is lower and soft, she reports stress and worry about this and that when her husband helped he pulled on her arms, my concern with this is she has little to no mm in her arms and worry about injury to the joints.  She had absolutely more spasms in the traps again today where as the past few visits it was much less.    PT Next Visit Plan  may need to work with her and her husband today on safe transfers and how to do with less risk for the patient and less risk for back injury to the husband    Consulted and Agree with Plan of Care  Patient        Patient will benefit from skilled therapeutic intervention in order to improve the following deficits and impairments:  Pain, Improper body mechanics, Impaired sensation, Increased muscle spasms, Postural dysfunction, Impaired tone, Impaired UE functional use, Decreased strength, Decreased range of motion, Decreased activity tolerance, Impaired flexibility  Visit Diagnosis: Muscle weakness (generalized)  Cervicalgia  Radiculopathy, cervical region     Problem List Patient Active Problem List   Diagnosis Date Noted  . Collagenous colitis 07/28/2018  . Decreased sensation of lower extremity 07/20/2018  . ALS (amyotrophic lateral sclerosis) ([place]) 12/23/2017  . Muscle weakness (generalized) 11/23/2017  . Bilateral arm weakness 11/11/2017  . Elevated blood pressure reading without diagnosis of hypertension 11/11/2017  . Post herpetic neuralgia 11/11/2017  . MDS (myelodysplastic syndrome) ([place]) 01/22/2015    Sumner Boast., PT 10/11/2019, 4:51 PM  [address] [place], [place], [address] Phone: [phone]   Fax:  [phone]  Name: Lori Alvarez MRN: [id number] Date of Birth: Jun 01, 1961

## 2019-10-16 ENCOUNTER — Other Ambulatory Visit: Payer: Self-pay

## 2019-10-16 ENCOUNTER — Ambulatory Visit: Payer: Medicare Other | Admitting: Physical Therapy

## 2019-10-16 ENCOUNTER — Encounter: Payer: Self-pay | Admitting: Physical Therapy

## 2019-10-16 DIAGNOSIS — M5412 Radiculopathy, cervical region: Secondary | ICD-10-CM

## 2019-10-16 DIAGNOSIS — M6281 Muscle weakness (generalized): Secondary | ICD-10-CM | POA: Diagnosis not present

## 2019-10-16 DIAGNOSIS — M542 Cervicalgia: Secondary | ICD-10-CM

## 2019-10-16 NOTE — Therapy (Signed)
[place], [place], [address] Phone: [phone]   Fax:  [phone]  Physical Therapy Treatment  Patient Details  Name: Lori Alvarez MRN: [id number] Date of Birth: Mar 04, 1961 Referring Provider (PT): Elsner   Encounter Date: 10/16/2019  PT End of Session - 10/16/19 1537    Visit Number  19    Date for PT Re-Evaluation  11/02/19    PT Start Time  1433    PT Stop Time  1539    PT Time Calculation (min)  66 min    Activity Tolerance  Patient tolerated treatment well;Patient limited by pain    Behavior During Therapy  [facility] for tasks assessed/performed       Past Medical History:  Diagnosis Date  . ALS (amyotrophic lateral sclerosis) ([place])   . Postherpetic neuralgia     Past Surgical History:  Procedure Laterality Date  . APPENDECTOMY    . PARTIAL HYSTERECTOMY      There were no vitals filed for this visit.  Subjective Assessment - 10/16/19 1528    Subjective  Patient reports that she is doing a little better, still very weak and sore    Currently in Pain?  Yes    Pain Score  4     Pain Location  Neck                       [facility] Adult PT Treatment/Exercise - 10/16/19 0001      High Level Balance   High Level Balance Comments  resisted gait all directions, soccer kicks      Neck Exercises: Machines for Strengthening   UBE (Upper Arm Bike)  level 1 x 3 minutes set up assist and then her on own much more help needed today    Other Machines for Strengthening  leg extension 5# 2x 10, leg flexion 20#  2x 10, leg press 20# x 8, no weight x 8    Other Machines for Strengthening  5# triceps      Neck Exercises: Standing   Other Standing Exercises  used the computer cart and had her push and pull 10 x with each arm working on hand, elbow and shoulder use as well as trunk stability      Neck Exercises: Supine   Other Supine Exercise  ball squeeze with bridge, clamshell  withred tband with bridge    Other Supine Exercise  did a lot of work on TA and trying to get posterior pelvic tilt to engage core      Moist Heat Therapy   Number Minutes Moist Heat  10 Minutes    Moist Heat Location  Cervical      Electrical Stimulation   Electrical Stimulation Location  upper trap and into the cervical area    Electrical Stimulation Action  IFC    Electrical Stimulation Parameters  sitting     Electrical Stimulation Goals  Pain      Manual Therapy   Manual Therapy  Soft tissue mobilization;Manual Traction    Manual therapy comments  gentle stretches to the upper trap and levator    Soft tissue mobilization  cervical area, upper traps and into the biceps    Manual Traction  occipital release, shoulder depression               PT Short Term Goals - 08/14/19 1426      PT SHORT TERM GOAL #1  Title  independent with initial HEP    Status  Achieved        PT Long Term Goals - 10/11/19 1650      PT LONG TERM GOAL #1   Title  report pain decreased 25%    Status  Partially Met      PT LONG TERM GOAL #3   Title  increase right grip strength to 25#    Status  On-going      PT LONG TERM GOAL #4   Title  increase cervical ROM 25%    Status  Partially Met            Plan - 10/16/19 1547    Clinical Impression Statement  Patient did a very good job with the posterior pelvic tilts and the TA activation, less cues needed.  We are still struggling with how to increase the activites with this as she tends to fatigue quickly and some of the exercises causes pain i the biceps and the neck when we add to it.  She did lose her balance some today with the soccer kicks    PT Next Visit Plan  may need to work with her and her husband in the future on safe transfers and how to do with less risk for the patient and less risk for back injury to the husband    Consulted and Agree with Plan of Care  Patient       Patient will benefit from skilled therapeutic  intervention in order to improve the following deficits and impairments:  Pain, Improper body mechanics, Impaired sensation, Increased muscle spasms, Postural dysfunction, Impaired tone, Impaired UE functional use, Decreased strength, Decreased range of motion, Decreased activity tolerance, Impaired flexibility  Visit Diagnosis: Muscle weakness (generalized)  Cervicalgia  Radiculopathy, cervical region     Problem List Patient Active Problem List   Diagnosis Date Noted  . Collagenous colitis 07/28/2018  . Decreased sensation of lower extremity 07/20/2018  . ALS (amyotrophic lateral sclerosis) ([place]) 12/23/2017  . Muscle weakness (generalized) 11/23/2017  . Bilateral arm weakness 11/11/2017  . Elevated blood pressure reading without diagnosis of hypertension 11/11/2017  . Post herpetic neuralgia 11/11/2017  . MDS (myelodysplastic syndrome) ([place]) 01/22/2015    Sumner Boast., PT 10/16/2019, 3:51 PM  [address] [place], [place], [address] Phone: [phone]   Fax:  [phone]  Name: Lori Alvarez MRN: [id number] Date of Birth: [birth year]

## 2019-10-18 ENCOUNTER — Other Ambulatory Visit: Payer: Self-pay

## 2019-10-18 ENCOUNTER — Encounter: Payer: Self-pay | Admitting: Physical Therapy

## 2019-10-18 ENCOUNTER — Ambulatory Visit: Payer: Medicare Other | Admitting: Physical Therapy

## 2019-10-18 DIAGNOSIS — M6281 Muscle weakness (generalized): Secondary | ICD-10-CM

## 2019-10-18 DIAGNOSIS — M5412 Radiculopathy, cervical region: Secondary | ICD-10-CM

## 2019-10-18 DIAGNOSIS — M542 Cervicalgia: Secondary | ICD-10-CM

## 2019-10-18 NOTE — Therapy (Signed)
[address] [place], [place], [address] Phone: [phone]   Fax:  [phone] Progress Note Reporting Period 09/17/20 to 10/18/19 for visits 11-20  See note below for Objective Data and Assessment of Progress/Goals.      Physical Therapy Treatment  Patient Details  Name: Lori Alvarez MRN: [id number] Date of Birth: 1961-06-10 Referring Provider (PT): Elsner   Encounter Date: 10/18/2019  PT End of Session - 10/18/19 1534    Visit Number  20    Date for PT Re-Evaluation  11/02/19    PT Start Time  1433    PT Stop Time  1536    PT Time Calculation (min)  63 min    Activity Tolerance  Patient tolerated treatment well    Behavior During Therapy  [facility] for tasks assessed/performed       Past Medical History:  Diagnosis Date  . ALS (amyotrophic lateral sclerosis) ([place])   . Postherpetic neuralgia     Past Surgical History:  Procedure Laterality Date  . APPENDECTOMY    . PARTIAL HYSTERECTOMY      There were no vitals filed for this visit.  Subjective Assessment - 10/18/19 1455    Subjective  I was a little sore    Currently in Pain?  Yes    Pain Location  Neck         [facility] PT Assessment - 10/18/19 0001      Strength   Overall Strength Comments  right grip 15#, left 11#                   [facility] Adult PT Treatment/Exercise - 10/18/19 0001      Neck Exercises: Machines for Strengthening   Other Machines for Strengthening  leg extension 5# 2x 10, leg flexion 20#  2x 10, leg press 20# x 8, no weight x 8, tried 30# did 2x4 reps    Other Machines for Strengthening  5# triceps, 5# rows with a little assist      Neck Exercises: Seated   Other Seated Exercise  partial sit ups on the leg press machine    Other Seated Exercise  ball b/n knees squeeze, biceps with pool noodle, clams in supine with green tband, small bridges      Neck Exercises: Supine   Other Supine Exercise   continue to work the core with pelvic tilts and ab sets      Moist Heat Therapy   Number Minutes Moist Heat  10 Minutes    Moist Heat Location  Cervical      Electrical Stimulation   Electrical Stimulation Location  upper trap and into the cervical area    Electrical Stimulation Action  IFC    Electrical Stimulation Parameters  sitting    Electrical Stimulation Goals  Pain      Manual Therapy   Manual Therapy  Soft tissue mobilization;Manual Traction    Manual therapy comments  gentle stretches to the upper trap and levator    Soft tissue mobilization  cervical area, upper traps and into the biceps    Manual Traction  occipital release, shoulder depression      Neck Exercises: Stretches   Upper Trapezius Stretch  Right;Left;3 reps;10 seconds    Levator Stretch  Right;Left;3 reps;10 seconds    Corner Stretch  3 reps;10 seconds;Limitations   help to place hands on the wall  PT Short Term Goals - 08/14/19 1426      PT SHORT TERM GOAL #1   Title  independent with initial HEP    Status  Achieved        PT Long Term Goals - 10/18/19 1538      PT LONG TERM GOAL #1   Title  report pain decreased 25%    Status  Partially Met      PT LONG TERM GOAL #2   Title  report that she can do her hair on her own    Status  On-going      PT LONG TERM GOAL #3   Title  increase right grip strength to 25#    Status  On-going            Plan - 10/18/19 1535    Clinical Impression Statement  Has not lost any strength in th hand, may have gained a pound on the left.  She has less knots and pain in the upper traps and the biceps, biggest issue is the ALS and the loss of mm in her arm, she is having to compensate in the shoulders and the neck and this is causing the pain with the radiculopathy.  I have been working on her leg strength and her core to help with overall function and quality of life    PT Next Visit Plan  continue to work on function and the pain     Consulted and Agree with Plan of Care  Patient       Patient will benefit from skilled therapeutic intervention in order to improve the following deficits and impairments:  Pain, Improper body mechanics, Impaired sensation, Increased muscle spasms, Postural dysfunction, Impaired tone, Impaired UE functional use, Decreased strength, Decreased range of motion, Decreased activity tolerance, Impaired flexibility  Visit Diagnosis: Muscle weakness (generalized)  Cervicalgia  Radiculopathy, cervical region     Problem List Patient Active Problem List   Diagnosis Date Noted  . Collagenous colitis 07/28/2018  . Decreased sensation of lower extremity 07/20/2018  . ALS (amyotrophic lateral sclerosis) ([place]) 12/23/2017  . Muscle weakness (generalized) 11/23/2017  . Bilateral arm weakness 11/11/2017  . Elevated blood pressure reading without diagnosis of hypertension 11/11/2017  . Post herpetic neuralgia 11/11/2017  . MDS (myelodysplastic syndrome) ([place]) 01/22/2015    Sumner Boast., PT 10/18/2019, 3:39 PM  [address] [place], [place], [address] Phone: [phone]   Fax:  [phone]  Name: Lori Alvarez MRN: [id number] Date of Birth: 29-Jun-1961

## 2019-10-23 ENCOUNTER — Encounter: Payer: Self-pay | Admitting: Physical Therapy

## 2019-10-23 ENCOUNTER — Other Ambulatory Visit: Payer: Self-pay

## 2019-10-23 ENCOUNTER — Ambulatory Visit: Payer: Medicare Other | Attending: Neurological Surgery | Admitting: Physical Therapy

## 2019-10-23 DIAGNOSIS — M542 Cervicalgia: Secondary | ICD-10-CM | POA: Diagnosis not present

## 2019-10-23 DIAGNOSIS — M6281 Muscle weakness (generalized): Secondary | ICD-10-CM | POA: Diagnosis not present

## 2019-10-23 DIAGNOSIS — M5412 Radiculopathy, cervical region: Secondary | ICD-10-CM | POA: Diagnosis not present

## 2019-10-23 NOTE — Therapy (Signed)
[place] [address] [place], [place], [address] Phone: [phone]   Fax:  [phone]  Physical Therapy Treatment  Patient Details  Name: Lori Alvarez MRN: [id number] Date of Birth: 07-05-1961 Referring Provider (PT): Elsner   Encounter Date: 10/23/2019  PT End of Session - 10/23/19 1525    Visit Number  21    Date for PT Re-Evaluation  11/02/19    PT Start Time  6962    PT Stop Time  1534    PT Time Calculation (min)  58 min    Activity Tolerance  Patient limited by pain    Behavior During Therapy  [facility] for tasks assessed/performed       Past Medical History:  Diagnosis Date  . ALS (amyotrophic lateral sclerosis) ([place])   . Postherpetic neuralgia     Past Surgical History:  Procedure Laterality Date  . APPENDECTOMY    . PARTIAL HYSTERECTOMY      There were no vitals filed for this visit.  Subjective Assessment - 10/23/19 1438    Subjective  Patient reports that she has been very sore in the arms, shoulders and the neck area, unsure of why    Currently in Pain?  Yes    Pain Score  6     Pain Location  Neck    Aggravating Factors   unsure                       [facility] Adult PT Treatment/Exercise - 10/23/19 0001      Neck Exercises: Machines for Strengthening   Other Machines for Strengthening  leg extension 5# 2x 10, leg flexion 20#  2x 10, leg press 20# x 8,      Moist Heat Therapy   Number Minutes Moist Heat  10 Minutes    Moist Heat Location  Cervical      Electrical Stimulation   Electrical Stimulation Location  upper trap and into the cervical area    Electrical Stimulation Action  IFC    Electrical Stimulation Parameters  sitting    Electrical Stimulation Goals  Pain      Manual Therapy   Manual Therapy  Soft tissue mobilization;Manual Traction    Manual therapy comments  gentle stretches to the upper trap and levator    Soft tissue mobilization  cervical area,  upper traps and into the biceps    Manual Traction  occipital release, shoulder depression      Neck Exercises: Stretches   Upper Trapezius Stretch  Right;Left;3 reps;10 seconds    Levator Stretch  Right;Left;3 reps;10 seconds    Corner Stretch  3 reps;10 seconds;Limitations               PT Short Term Goals - 08/14/19 1426      PT SHORT TERM GOAL #1   Title  independent with initial HEP    Status  Achieved        PT Long Term Goals - 10/18/19 1538      PT LONG TERM GOAL #1   Title  report pain decreased 25%    Status  Partially Met      PT LONG TERM GOAL #2   Title  report that she can do her hair on her own    Status  On-going      PT LONG TERM GOAL #3   Title  increase right grip strength to 25#  Status  On-going            Plan - 10/23/19 1527    Clinical Impression Statement  Patient is unsure why but has had increaesd neck and arm pain over the past 4 days.  She has much more tension and tightness wiht knots in the upper traps and the teres areas, she had been doing very well and most of the tightness was in the neck and the traps were good.    PT Next Visit Plan  address the traps    Consulted and Agree with Plan of Care  Patient       Patient will benefit from skilled therapeutic intervention in order to improve the following deficits and impairments:  Pain, Improper body mechanics, Impaired sensation, Increased muscle spasms, Postural dysfunction, Impaired tone, Impaired UE functional use, Decreased strength, Decreased range of motion, Decreased activity tolerance, Impaired flexibility  Visit Diagnosis: Muscle weakness (generalized)  Cervicalgia  Radiculopathy, cervical region     Problem List Patient Active Problem List   Diagnosis Date Noted  . Collagenous colitis 07/28/2018  . Decreased sensation of lower extremity 07/20/2018  . ALS (amyotrophic lateral sclerosis) ([place]) 12/23/2017  . Muscle weakness (generalized) 11/23/2017  .  Bilateral arm weakness 11/11/2017  . Elevated blood pressure reading without diagnosis of hypertension 11/11/2017  . Post herpetic neuralgia 11/11/2017  . MDS (myelodysplastic syndrome) ([place]) 01/22/2015    Sumner Boast., PT 10/23/2019, 3:29 PM  [address] [place], [place], [address] Phone: [phone]   Fax:  [phone]  Name: Lori Alvarez MRN: [id number] Date of Birth: April 12, 1961

## 2019-10-25 ENCOUNTER — Ambulatory Visit: Payer: Medicare Other | Admitting: Physical Therapy

## 2019-10-25 ENCOUNTER — Encounter: Payer: Self-pay | Admitting: Physical Therapy

## 2019-10-25 ENCOUNTER — Other Ambulatory Visit: Payer: Self-pay

## 2019-10-25 DIAGNOSIS — G1221 Amyotrophic lateral sclerosis: Secondary | ICD-10-CM | POA: Diagnosis not present

## 2019-10-25 DIAGNOSIS — M6281 Muscle weakness (generalized): Secondary | ICD-10-CM | POA: Diagnosis not present

## 2019-10-25 DIAGNOSIS — M5412 Radiculopathy, cervical region: Secondary | ICD-10-CM | POA: Diagnosis not present

## 2019-10-25 DIAGNOSIS — M542 Cervicalgia: Secondary | ICD-10-CM | POA: Diagnosis not present

## 2019-10-25 NOTE — Therapy (Signed)
[place] [address] [place], [place], [address] Phone: [phone]   Fax:  [phone]  Physical Therapy Treatment  Patient Details  Name: Lori Alvarez MRN: [id number] Date of Birth: September 16, 1961 Referring Provider (PT): Elsner   Encounter Date: 10/25/2019  PT End of Session - 10/25/19 1640    Visit Number  22    Date for PT Re-Evaluation  11/02/19    PT Start Time  1526    PT Stop Time  1612    PT Time Calculation (min)  46 min    Activity Tolerance  Patient limited by pain    Behavior During Therapy  [facility] for tasks assessed/performed       Past Medical History:  Diagnosis Date  . ALS (amyotrophic lateral sclerosis) ([place])   . Postherpetic neuralgia     Past Surgical History:  Procedure Laterality Date  . APPENDECTOMY    . PARTIAL HYSTERECTOMY      There were no vitals filed for this visit.  Subjective Assessment - 10/25/19 1541    Subjective  I was doing pretty good, I always get sore after the treatments    Currently in Pain?  Yes    Pain Location  Neck                       [facility] Adult PT Treatment/Exercise - 10/25/19 0001      Neck Exercises: Machines for Strengthening   Other Machines for Strengthening  leg extension 5# 2x 10, leg flexion 20#  2x 10, leg press 20# x 8,      Neck Exercises: Seated   Other Seated Exercise  bosu behind partial sit ups 4x 5 reps    Other Seated Exercise  passive stretch of the shoulders flexion and horizontal abduction      Neck Exercises: Supine   Other Supine Exercise  ball squeeze with bridge, clamshell withred tband with bridge    Other Supine Exercise  continue to work the core with pelvic tilts and ab sets      Moist Heat Therapy   Number Minutes Moist Heat  10 Minutes    Moist Heat Location  Cervical      Electrical Stimulation   Electrical Stimulation Location  upper trap and into the cervical area    Electrical Stimulation  Action  IFC    Electrical Stimulation Parameters  sitting    Electrical Stimulation Goals  Pain      Manual Therapy   Manual Therapy  Soft tissue mobilization;Manual Traction    Manual therapy comments  gentle stretches to the upper trap and levator, tried some stretches to the shoulders into flexion and horizontal abduction this seemed to be very tight and she asked to stop    Soft tissue mobilization  cervical area, upper traps and into the biceps    Manual Traction  occipital release, shoulder depression               PT Short Term Goals - 08/14/19 1426      PT SHORT TERM GOAL #1   Title  independent with initial HEP    Status  Achieved        PT Long Term Goals - 10/25/19 1643      PT LONG TERM GOAL #1   Title  report pain decreased 25%    Status  Partially Met      PT LONG TERM GOAL #4  Title  increase cervical ROM 25%    Status  Achieved            Plan - 10/25/19 1641    Clinical Impression Statement  I tried some passove ROM of the shoulders into flexiona nd horizontal abduction, this was very tight and not tolerated well, she seemed to have a little less spasm and knots in the upper traps today    PT Next Visit Plan  try to get more ROM passively of the arms and work on core and function as well as pain    Consulted and Agree with Plan of Care  Patient       Patient will benefit from skilled therapeutic intervention in order to improve the following deficits and impairments:  Pain, Improper body mechanics, Impaired sensation, Increased muscle spasms, Postural dysfunction, Impaired tone, Impaired UE functional use, Decreased strength, Decreased range of motion, Decreased activity tolerance, Impaired flexibility  Visit Diagnosis: Muscle weakness (generalized)  Cervicalgia  Radiculopathy, cervical region     Problem List Patient Active Problem List   Diagnosis Date Noted  . Collagenous colitis 07/28/2018  . Decreased sensation of lower  extremity 07/20/2018  . ALS (amyotrophic lateral sclerosis) ([place]) 12/23/2017  . Muscle weakness (generalized) 11/23/2017  . Bilateral arm weakness 11/11/2017  . Elevated blood pressure reading without diagnosis of hypertension 11/11/2017  . Post herpetic neuralgia 11/11/2017  . MDS (myelodysplastic syndrome) ([place]) 01/22/2015    Sumner Boast., PT 10/25/2019, 4:44 PM  [address] [address] [place], [place], [address] Phone: [phone]   Fax:  [phone]  Name: Lori Alvarez MRN: [id number] Date of Birth: Dec 28, 1960

## 2019-10-26 ENCOUNTER — Ambulatory Visit: Payer: Medicare Other | Admitting: Physical Therapy

## 2019-10-27 ENCOUNTER — Ambulatory Visit (INDEPENDENT_AMBULATORY_CARE_PROVIDER_SITE_OTHER): Payer: Medicare Other | Admitting: Psychiatry

## 2019-10-27 DIAGNOSIS — F321 Major depressive disorder, single episode, moderate: Secondary | ICD-10-CM | POA: Diagnosis not present

## 2019-10-27 NOTE — Progress Notes (Signed)
[facility] Counselor/Therapist Progress Note  Patient ID: Lori Alvarez, MRN: [id number],    Date: 10/27/2019  Time Spent: 60 minutes  12:00noon to 1:00pm  Virtual Visit Note Connected with patient by a video enabled telemedicine/telehealth application or telephone, with their informed consent, and verified patient privacy and that I am speaking with the correct person using two identifiers. I discussed the limitations, risks, security and privacy concerns of performing psychotherapy and management service by telephone and the availability of in person appointments. I also discussed with the patient that there may be a patient responsible charge related to this service. The patient expressed understanding and agreed to proceed. I discussed the treatment planning with the patient. The patient was provided an opportunity to ask questions and all were answered. The patient agreed with the plan and demonstrated an understanding of the instructions. The patient was advised to call  our office if  symptoms worsen or feel they are in a crisis state and need immediate contact.   Therapist Location: [facility] Patient Location: home   Treatment Type: Individual Therapy  Reported Symptoms: anxiety, depression, overwhelmed at times with her ALS  Mental Status Exam:  Appearance:   n/a  telehealth     Behavior:  Sharing  Motor:  n/a  telehealth  Speech/Language:   Normal Rate  Affect:  n/a  telehealth  Mood:  anxious and depressed  Thought process:  goal directed  Thought content:    WNL  Sensory/Perceptual disturbances:    WNL  Orientation:  oriented to person, place, time/date, situation, day of week, month of year and year  Attention:  Good  Concentration:  Good  Memory:  WNL  Fund of knowledge:   Good  Insight:    Good  Judgment:   Good  Impulse Control:  Good   Risk Assessment: Danger to Self:  No Self-injurious Behavior: No Danger to Others: No Duty to  Warn:no Physical Aggression / Violence:No  Access to Firearms a concern: No  Gang Involvement:No   Subjective:  Patient struggling to manage her emotional and physical aspects of having progressing ALS.   Interventions: Solution-Oriented/Positive Psychology and Ego-Supportive  Diagnosis:   ICD-10-CM   1. Moderate major depression, single episode ([place])  F32.1     Plan: Patient is not signing tx plan on a computer screen due to [place].  Treatment Goals: Goals may remain here in tx plan as patient works on strategies to meet her goals. Some changes made in goals today to better meet patient needs.  Long term goal: Develop the ability to recognize, accept, and cope with feelings of depression.  Short term goal: Learn and implement calming skills to reduce overall tension and moments of increased depression and anxiety.  Strategies: 1) Identify cognitive self-talk that supports depression. Work to interrupt those self-talk messages, and eventually replace it with self-talk that patient feels is more supportive to her in managing her ALS symptoms and progression. 2) Encourage patient's open, full expression of her depression and anxiety as she navigates her journey with advancing ALS.  PROGRESS: Patient today reporting PT has been helpful especially in maintaining some strengths, but still dealing with a lot of limited mobility, very decreased strength, and pain.  She is hoping to keep going for PT a while longer.  Also feeling depressed at times and overwhelmed at her losses physically. The PT helps, prayer helps, some visits/friends but that's been limited due to Covid-19. Holidays were difficult emotionally and physically with  her limitations and pain.  Difficult situations with sister and lack of concern/care and this is hurtful to patient which we discussed together today.  Patient is focusing on her goals especially using sessions to be very honest and open about her illness and  anxious thoughts, and uncertainties ahead.  Today working on "what do I want to do with my time, even with  My disease, versus just focusing on all the negatives. Trying to focus on positives even with the obstacles.  Goal review and progress/efforts noted with patient.  Next appt within 3 weeks.   Shanon Ace, LCSW

## 2019-10-30 ENCOUNTER — Ambulatory Visit: Payer: Medicare Other | Admitting: Physical Therapy

## 2019-10-30 ENCOUNTER — Encounter: Payer: Self-pay | Admitting: Physical Therapy

## 2019-10-30 ENCOUNTER — Other Ambulatory Visit: Payer: Self-pay

## 2019-10-30 DIAGNOSIS — M5412 Radiculopathy, cervical region: Secondary | ICD-10-CM | POA: Diagnosis not present

## 2019-10-30 DIAGNOSIS — M6281 Muscle weakness (generalized): Secondary | ICD-10-CM

## 2019-10-30 DIAGNOSIS — M542 Cervicalgia: Secondary | ICD-10-CM | POA: Diagnosis not present

## 2019-10-30 NOTE — Therapy (Signed)
[place] [address] [place], [place], [address] Phone: [phone]   Fax:  [phone]  Physical Therapy Treatment  Patient Details  Name: Lori Alvarez MRN: [id number] Date of Birth: 03-01-1961 Referring Provider (PT): Elsner   Encounter Date: 10/30/2019  PT End of Session - 10/30/19 1609    Visit Number  23    Date for PT Re-Evaluation  11/02/19    PT Start Time  1435    PT Stop Time  1525    PT Time Calculation (min)  50 min    Activity Tolerance  Patient limited by pain    Behavior During Therapy  [facility] for tasks assessed/performed       Past Medical History:  Diagnosis Date  . ALS (amyotrophic lateral sclerosis) ([place])   . Postherpetic neuralgia     Past Surgical History:  Procedure Laterality Date  . APPENDECTOMY    . PARTIAL HYSTERECTOMY      There were no vitals filed for this visit.  Subjective Assessment - 10/30/19 1607    Subjective  Patient reports that she is not feeling well today, having stomach issues as she reports an issue with an ovary in the past    Currently in Pain?  Yes    Pain Score  6     Pain Location  Neck    Aggravating Factors   patient reports that she is having stomach issues due to an ovary                       [facility] Adult PT Treatment/Exercise - 10/30/19 0001      Moist Heat Therapy   Number Minutes Moist Heat  10 Minutes    Moist Heat Location  Cervical      Electrical Stimulation   Electrical Stimulation Location  upper trap and into the cervical area    Electrical Stimulation Action  IFC    Electrical Stimulation Parameters  sitting    Electrical Stimulation Goals  Pain      Manual Therapy   Manual Therapy  Soft tissue mobilization;Manual Traction    Manual therapy comments  gentle stretches to the upper trap and levator, tried some stretches to the shoulders into flexion and horizontal abduction this seemed to be very tight and she asked  to stop    Soft tissue mobilization  cervical area, upper traps and into the biceps    Manual Traction  occipital release, shoulder depression               PT Short Term Goals - 08/14/19 1426      PT SHORT TERM GOAL #1   Title  independent with initial HEP    Status  Achieved        PT Long Term Goals - 10/25/19 1643      PT LONG TERM GOAL #1   Title  report pain decreased 25%    Status  Partially Met      PT LONG TERM GOAL #4   Title  increase cervical ROM 25%    Status  Achieved            Plan - 10/30/19 1610    Clinical Impression Statement  Patient reports that she is having issues with an ovary and did not feel that she could do any exercises.  She was very tight in neck area, the traps, I tried some stretches on teh arms, she  is very tight in the wrist and the pecs    PT Next Visit Plan  may try to stretch the pecs and the UE's    Consulted and Agree with Plan of Care  Patient       Patient will benefit from skilled therapeutic intervention in order to improve the following deficits and impairments:  Pain, Improper body mechanics, Impaired sensation, Increased muscle spasms, Postural dysfunction, Impaired tone, Impaired UE functional use, Decreased strength, Decreased range of motion, Decreased activity tolerance, Impaired flexibility  Visit Diagnosis: Muscle weakness (generalized)  Cervicalgia  Radiculopathy, cervical region     Problem List Patient Active Problem List   Diagnosis Date Noted  . Collagenous colitis 07/28/2018  . Decreased sensation of lower extremity 07/20/2018  . ALS (amyotrophic lateral sclerosis) ([place]) 12/23/2017  . Muscle weakness (generalized) 11/23/2017  . Bilateral arm weakness 11/11/2017  . Elevated blood pressure reading without diagnosis of hypertension 11/11/2017  . Post herpetic neuralgia 11/11/2017  . MDS (myelodysplastic syndrome) ([place]) 01/22/2015    Sumner Boast., PT 10/30/2019, 4:19 PM  [address] [address] [place], [place], [address] Phone: [phone]   Fax:  [phone]  Name: Lori Alvarez MRN: [id number] Date of Birth: August 08, 1961

## 2019-11-01 ENCOUNTER — Other Ambulatory Visit: Payer: Self-pay

## 2019-11-01 ENCOUNTER — Emergency Department (HOSPITAL_BASED_OUTPATIENT_CLINIC_OR_DEPARTMENT_OTHER): Payer: Medicare Other

## 2019-11-01 ENCOUNTER — Ambulatory Visit: Payer: Medicare Other | Admitting: Physical Therapy

## 2019-11-01 ENCOUNTER — Ambulatory Visit: Payer: Medicare Other | Admitting: Psychiatry

## 2019-11-01 ENCOUNTER — Encounter (HOSPITAL_BASED_OUTPATIENT_CLINIC_OR_DEPARTMENT_OTHER): Payer: Self-pay | Admitting: *Deleted

## 2019-11-01 ENCOUNTER — Emergency Department (HOSPITAL_BASED_OUTPATIENT_CLINIC_OR_DEPARTMENT_OTHER)
Admission: EM | Admit: 2019-11-01 | Discharge: 2019-11-01 | Disposition: A | Discharge status: Home / Self Care | Payer: Medicare Other | Attending: Emergency Medicine | Admitting: Emergency Medicine

## 2019-11-01 DIAGNOSIS — R1084 Generalized abdominal pain: Secondary | ICD-10-CM

## 2019-11-01 DIAGNOSIS — R110 Nausea: Secondary | ICD-10-CM | POA: Insufficient documentation

## 2019-11-01 DIAGNOSIS — G1221 Amyotrophic lateral sclerosis: Secondary | ICD-10-CM | POA: Diagnosis not present

## 2019-11-01 DIAGNOSIS — R1033 Periumbilical pain: Secondary | ICD-10-CM | POA: Diagnosis not present

## 2019-11-01 DIAGNOSIS — Z79899 Other long term (current) drug therapy: Secondary | ICD-10-CM | POA: Diagnosis not present

## 2019-11-01 LAB — CBC WITH DIFFERENTIAL/PLATELET
Abs Immature Granulocytes: 0.02 10*3/uL (ref 0.00–0.07)
Basophils Absolute: 0 10*3/uL (ref 0.0–0.1)
Basophils Relative: 0 %
Eosinophils Absolute: 0 10*3/uL (ref 0.0–0.5)
Eosinophils Relative: 0 %
HCT: 41.8 % (ref 36.0–46.0)
Hemoglobin: 13.3 g/dL (ref 12.0–15.0)
Immature Granulocytes: 0 %
Lymphocytes Relative: 19 %
Lymphs Abs: 1.8 10*3/uL (ref 0.7–4.0)
MCH: 29.1 pg (ref 26.0–34.0)
MCHC: 31.8 g/dL (ref 30.0–36.0)
MCV: 91.5 fL (ref 80.0–100.0)
Monocytes Absolute: 1 10*3/uL (ref 0.1–1.0)
Monocytes Relative: 10 %
Neutro Abs: 6.6 10*3/uL (ref 1.7–7.7)
Neutrophils Relative %: 71 %
Platelets: 351 10*3/uL (ref 150–400)
RBC: 4.57 MIL/uL (ref 3.87–5.11)
RDW: 15.9 % — ABNORMAL HIGH (ref 11.5–15.5)
WBC: 9.4 10*3/uL (ref 4.0–10.5)
nRBC: 0 % (ref 0.0–0.2)

## 2019-11-01 LAB — COMPREHENSIVE METABOLIC PANEL
ALT: 19 U/L (ref 0–44)
AST: 16 U/L (ref 15–41)
Albumin: 4.3 g/dL (ref 3.5–5.0)
Alkaline Phosphatase: 77 U/L (ref 38–126)
Anion gap: 7 (ref 5–15)
BUN: 17 mg/dL (ref 6–20)
CO2: 28 mmol/L (ref 22–32)
Calcium: 9.8 mg/dL (ref 8.9–10.3)
Chloride: 103 mmol/L (ref 98–111)
Creatinine, Ser: 0.51 mg/dL (ref 0.44–1.00)
GFR calc Af Amer: >60 mL/min (ref >60)
GFR calc non Af Amer: >60 mL/min (ref >60)
Glucose, Bld: 103 mg/dL — ABNORMAL HIGH (ref 70–99)
Potassium: 4 mmol/L (ref 3.5–5.1)
Sodium: 138 mmol/L (ref 135–145)
Total Bilirubin: 0.4 mg/dL (ref 0.3–1.2)
Total Protein: 6.4 g/dL — ABNORMAL LOW (ref 6.5–8.1)

## 2019-11-01 LAB — URINALYSIS, ROUTINE W REFLEX MICROSCOPIC
Bilirubin Urine: NEGATIVE
Glucose, UA: NEGATIVE mg/dL
Hgb urine dipstick: NEGATIVE
Ketones, ur: NEGATIVE mg/dL
Leukocytes,Ua: NEGATIVE
Nitrite: NEGATIVE
Protein, ur: NEGATIVE mg/dL
Specific Gravity, Urine: <1.005 — ABNORMAL LOW (ref 1.005–1.030)
pH: 6.5 (ref 5.0–8.0)

## 2019-11-01 LAB — LIPASE, BLOOD: Lipase: 21 U/L (ref 11–51)

## 2019-11-01 MED ORDER — IOHEXOL 300 MG/ML  SOLN
100.0000 mL | Freq: Once | INTRAMUSCULAR | Status: AC | PRN
  Administered 2019-11-01: 12:00:00 100 mL via INTRAVENOUS

## 2019-11-01 MED ORDER — KETOROLAC TROMETHAMINE 15 MG/ML IJ SOLN
15.0000 mg | Freq: Once | INTRAMUSCULAR | Status: AC
  Administered 2019-11-01: 15 mg via INTRAVENOUS
  Filled 2019-11-01: qty 1

## 2019-11-01 MED ORDER — METHOCARBAMOL 500 MG PO TABS: 500.0000 mg | ORAL_TABLET | Freq: Two times a day (BID) | ORAL | 0 refills | Status: DC

## 2019-11-01 MED ORDER — SODIUM CHLORIDE 0.9 % IV BOLUS
1000.0000 mL | Freq: Once | INTRAVENOUS | Status: AC
  Administered 2019-11-01: 12:00:00 1000 mL via INTRAVENOUS

## 2019-11-01 MED FILL — METHOCARBAMOL 500 MG TABS: 500 | 10 days supply | Qty: 20 | Fill #0

## 2019-11-01 NOTE — ED Provider Notes (Signed)
[place] EMERGENCY DEPARTMENT Provider Note   CSN: [id number] Arrival date & time: 11/01/19  1014     History Chief Complaint  Patient presents with  . Abdominal Pain    Lori Alvarez is a 59 y.o. female.  Patient is a 59 year old female with past medical history of ALS presenting to the emergency department for abdominal pain.  Patient reports that since Saturday she has had severe waxing and waning abdominal pain.  Reports that it is across her upper abdomen.  Reports having a history of ovarian cyst as well as a type of colitis.  Reports that the pain did worsen with eating yesterday around 4 PM and she has not had anything to eat since then.  Reports having some loose stools without blood.  Denies any dysuria, vaginal bleeding, vaginal discharge, fever, chills, vomiting, sick contacts.  She reports that this does feel somewhat similar to the pain she had with her ovarian cyst.  Reports that she usually takes Bentyl and this pain goes away but she took 3 doses of Bentyl and feels like the pain is only worsening.        Past Medical History:  Diagnosis Date  . ALS (amyotrophic lateral sclerosis) ([place])   . Postherpetic neuralgia     Patient Active Problem List   Diagnosis Date Noted  . Collagenous colitis 07/28/2018  . Decreased sensation of lower extremity 07/20/2018  . ALS (amyotrophic lateral sclerosis) ([place]) 12/23/2017  . Muscle weakness (generalized) 11/23/2017  . Bilateral arm weakness 11/11/2017  . Elevated blood pressure reading without diagnosis of hypertension 11/11/2017  . Post herpetic neuralgia 11/11/2017  . MDS (myelodysplastic syndrome) ([place]) 01/22/2015    Past Surgical History:  Procedure Laterality Date  . APPENDECTOMY    . PARTIAL HYSTERECTOMY       OB History   No obstetric history on file.     Family History  Problem Relation Age of Onset  . Myopathy Mother   . Cancer Father        renal    Social History   Tobacco Use   . Smoking status: Never Smoker  . Smokeless tobacco: Never Used  Substance Use Topics  . Alcohol use: No  . Drug use: No    Home Medications Prior to Admission medications   Medication Sig Start Date End Date Taking? Authorizing Provider  ALPRAZolam Duanne Moron) 0.5 MG tablet Take 0.5 mg by mouth as needed for anxiety.    [provider]  Black Cohosh (REMIFEMIN) 20 MG TABS Take 20 mg by mouth 2 (two) times daily.    [provider]  CALCIUM-VITAMIN D PO Take 1 tablet by mouth daily. 600-800    [provider]  Cyanocobalamin (VITAMIN B-12 PO) Take 500 mcg by mouth daily.    [provider]  meloxicam (MOBIC) 15 MG tablet Take 15 mg by mouth daily.    [provider]  methocarbamol (ROBAXIN) 500 MG tablet Take 1 tablet (500 mg total) by mouth 2 (two) times daily. 11/01/19   Alveria Apley, PA-C  Multiple Vitamins-Minerals (MULTIVITAMIN ADULTS PO) Take 1 tablet by mouth daily.    [provider]  ondansetron (ZOFRAN) 4 MG tablet Take 1 tablet (4 mg total) by mouth every 6 (six) hours. 09/05/19   Lennice Sites, DO  rizatriptan (MAXALT) 10 MG tablet  08/08/19   [provider]  VITAMIN E PO Take 400 Units by mouth daily.    [provider]  Zinc 50  MG TABS Take 50 mg by mouth daily.    [provider]    Allergies    Allegra [fexofenadine], Clotrim-undecyl-tea tr-lav oil, Doxepin, Keflex [cephalexin], Nitroglycerin, Nitrofuran derivatives, and Sulfa antibiotics  Review of Systems   Review of Systems  Constitutional: Positive for appetite change. Negative for activity change, chills, fatigue and fever.  HENT: Negative for sore throat.   Respiratory: Negative for cough and shortness of breath.   Cardiovascular: Negative for chest pain.  Gastrointestinal: Positive for abdominal pain and nausea. Negative for abdominal distention, anal bleeding, blood in stool, constipation and vomiting.  Endocrine: Negative for  polyuria.  Genitourinary: Negative for dysuria, flank pain, pelvic pain, urgency, vaginal bleeding and vaginal discharge.  Musculoskeletal: Negative for back pain.  Skin: Negative for pallor.  Neurological: Negative for dizziness.  Hematological: Does not bruise/bleed easily.    Physical Exam Updated Vital Signs BP 133/64   Pulse 88   Temp 97.7 F (36.5 C) (Oral)   Resp 16   SpO2 93%   Physical Exam Vitals and nursing note reviewed.  Constitutional:      General: She is not in acute distress.    Appearance: Normal appearance. She is well-developed. She is not ill-appearing, toxic-appearing or diaphoretic.  HENT:     Head: Normocephalic.  Eyes:     Conjunctiva/sclera: Conjunctivae normal.  Cardiovascular:     Rate and Rhythm: Normal rate and regular rhythm.  Pulmonary:     Effort: Pulmonary effort is normal.  Abdominal:     General: Abdomen is flat. Bowel sounds are increased.     Palpations: Abdomen is soft.     Tenderness: There is abdominal tenderness in the right lower quadrant, periumbilical area and left upper quadrant. There is no right CVA tenderness, left CVA tenderness or guarding. Negative signs include Murphy's sign, Rovsing's sign and McBurney's sign.  Skin:    General: Skin is warm and dry.  Neurological:     Mental Status: She is alert.  Psychiatric:        Mood and Affect: Mood normal.     ED Results / Procedures / Treatments   Labs (all labs ordered are listed, but only abnormal results are displayed) Labs Reviewed  CBC WITH DIFFERENTIAL/PLATELET - Abnormal; Notable for the following components:      Result Value   RDW 15.9 (*)    All other components within normal limits  COMPREHENSIVE METABOLIC PANEL - Abnormal; Notable for the following components:   Glucose, Bld 103 (*)    Total Protein 6.4 (*)    All other components within normal limits  URINALYSIS, ROUTINE W REFLEX MICROSCOPIC - Abnormal; Notable for the following components:   Specific  Gravity, Urine <1.005 (*)    All other components within normal limits  LIPASE, BLOOD    EKG None  Radiology CT ABDOMEN PELVIS W CONTRAST  Result Date: 11/01/2019 CLINICAL DATA:  Acute supraumbilical abdominal pain. EXAM: CT ABDOMEN AND PELVIS WITH CONTRAST TECHNIQUE: Multidetector CT imaging of the abdomen and pelvis was performed using the standard protocol following bolus administration of intravenous contrast. CONTRAST:  193mL OMNIPAQUE IOHEXOL 300 MG/ML  SOLN COMPARISON:  None. FINDINGS: Lower chest: No acute abnormality. Hepatobiliary: No focal liver abnormality is seen. No gallstones, gallbladder wall thickening, or biliary dilatation. Pancreas: Unremarkable. No pancreatic ductal dilatation or surrounding inflammatory changes. Spleen: Normal in size without focal abnormality. Adrenals/Urinary Tract: Adrenal glands are unremarkable. Kidneys are normal, without renal calculi, focal lesion, or hydronephrosis. Bladder is unremarkable. Stomach/Bowel: The  stomach appears normal. Status post appendectomy. No evidence of bowel obstruction or inflammation. Vascular/Lymphatic: Aortic atherosclerosis. No enlarged abdominal or pelvic lymph nodes. Reproductive: Status post hysterectomy. No adnexal masses. Other: No abdominal wall hernia or abnormality. No abdominopelvic ascites. Musculoskeletal: No acute or significant osseous findings. IMPRESSION: Aortic atherosclerosis. No acute abnormality seen in the abdomen or pelvis. Aortic Atherosclerosis (ICD10-I70.0). Electronically Signed   By: Marijo Conception M.D.   On: 11/01/2019 12:55    Procedures Procedures (including critical care time)  Medications Ordered in ED Medications  sodium chloride 0.9 % bolus 1,000 mL (0 mLs Intravenous Stopped 11/01/19 1350)  ketorolac (TORADOL) 15 MG/ML injection 15 mg (15 mg Intravenous Given 11/01/19 1149)  iohexol (OMNIPAQUE) 300 MG/ML solution 100 mL (100 mLs Intravenous Contrast Given 11/01/19 1224)    ED Course  I  have reviewed the triage vital signs and the nursing notes.  Pertinent labs & imaging results that were available during my care of the patient were reviewed by me and considered in my medical decision making (see chart for details).  Clinical Course as of Oct 31 1698  Wed Oct 31, 5218  5716 59 year old female here with few days of crampy abdominal pain.  She says Bentyl was helping a little bit.  Similar pain in the past that was thought to be ovarian cyst.  Lab work and CT imaging unremarkable.  Likely discharge with follow-up PCP.   [MB]  1329 Workup negative, patient improved with toradol. Patient has hx of collagenous colitis and ? If this may be causing the pain as well. Advised f/u GI and PMD. Advised on return precaution. Discussed options for pain management with the patient. She requests something for pain at home. She is already taking oxycodone for her ALS. I discussed with her the possibility of this pain being from her ALS as muscle spasms and a trial of robaxin may be helpful. She would like to try this.    [KM]    Clinical Course User Index [KM] Alveria Apley, PA-C [MB] Hayden Rasmussen, MD   MDM Rules/Calculators/A&P                      Based on review of vitals, medical screening exam, lab work and/or imaging, there does not appear to be an acute, emergent etiology for the patient's symptoms. Counseled pt on good return precautions and encouraged both PCP and ED follow-up as needed.  Prior to discharge, I also discussed incidental imaging findings with patient in detail and advised appropriate, recommended follow-up in detail.  Clinical Impression: 1. Generalized abdominal pain     Disposition: Discharge  Prior to providing a prescription for a controlled substance, I independently reviewed the patient's recent prescription history on the [facility]. The patient had no recent or regular prescriptions and was deemed  appropriate for a brief, less than 3 day prescription of narcotic for acute analgesia.  This note was prepared with assistance of Systems analyst. Occasional wrong-word or sound-a-like substitutions may have occurred due to the inherent limitations of voice recognition software.  Final Clinical Impression(s) / ED Diagnoses Final diagnoses:  Generalized abdominal pain    Rx / DC Orders ED Discharge Orders         Ordered    methocarbamol (ROBAXIN) 500 MG tablet  2 times daily     11/01/19 1342           Kristine Royal 11/01/19  1700    Hayden Rasmussen, MD 11/01/19 1725

## 2019-11-01 NOTE — ED Notes (Signed)
Patient transported to CT 

## 2019-11-01 NOTE — ED Triage Notes (Signed)
Pt is here for abdominal pain since Saturday. that is across her abdomen just above her belly button.  Pt has been self treating this with a heating pad without relief.  Pt has been taking bentyl with initial relief but the pain has persisted.

## 2019-11-01 NOTE — Discharge Instructions (Addendum)
You are seen today for abdominal pain.  Your lab work was reassuring and your CT scan did not show any abdominal abnormalities that would cause your pain  It is unclear why you are having these pains but it is very reassuring that your work-up today did not show anything emergent  You should follow-up with your primary care doctor and your GI specialist. We have given you a new prescription for a muscle relaxant. Do not take this with any other muscle relaxant. Thank you for allowing me to care for you today. Please return to the emergency department if you have new or worsening symptoms. Take your medications as instructed.

## 2019-11-06 ENCOUNTER — Ambulatory Visit: Payer: Medicare Other | Admitting: Physical Therapy

## 2019-11-08 ENCOUNTER — Ambulatory Visit: Payer: Medicare Other | Admitting: Physical Therapy

## 2019-11-13 ENCOUNTER — Ambulatory Visit: Payer: Medicare Other | Admitting: Physical Therapy

## 2019-11-15 ENCOUNTER — Ambulatory Visit
Admission: RE | Admit: 2019-11-15 | Discharge: 2019-11-15 | Disposition: A | Discharge status: Home / Self Care | Payer: Medicare Other | Source: Ambulatory Visit | Attending: Physician Assistant | Admitting: Physician Assistant

## 2019-11-15 ENCOUNTER — Ambulatory Visit: Payer: Medicare Other | Admitting: Physical Therapy

## 2019-11-15 ENCOUNTER — Other Ambulatory Visit: Payer: Self-pay | Admitting: Physician Assistant

## 2019-11-15 DIAGNOSIS — K5909 Other constipation: Secondary | ICD-10-CM

## 2019-11-15 DIAGNOSIS — R109 Unspecified abdominal pain: Secondary | ICD-10-CM | POA: Diagnosis not present

## 2019-11-15 DIAGNOSIS — G1221 Amyotrophic lateral sclerosis: Secondary | ICD-10-CM | POA: Diagnosis not present

## 2019-11-15 DIAGNOSIS — K52831 Collagenous colitis: Secondary | ICD-10-CM | POA: Diagnosis not present

## 2019-11-15 DIAGNOSIS — K5900 Constipation, unspecified: Secondary | ICD-10-CM | POA: Diagnosis not present

## 2019-11-16 ENCOUNTER — Ambulatory Visit: Payer: Medicare Other | Admitting: Psychiatry

## 2019-11-20 ENCOUNTER — Ambulatory Visit
Admission: RE | Admit: 2019-11-20 | Discharge: 2019-11-20 | Disposition: A | Discharge status: Home / Self Care | Payer: Medicare Other | Source: Ambulatory Visit | Attending: Physician Assistant | Admitting: Physician Assistant

## 2019-11-20 ENCOUNTER — Encounter: Payer: BC Managed Care – PPO | Admitting: Physical Therapy

## 2019-11-20 ENCOUNTER — Other Ambulatory Visit: Payer: Self-pay | Admitting: Physician Assistant

## 2019-11-20 DIAGNOSIS — K56609 Unspecified intestinal obstruction, unspecified as to partial versus complete obstruction: Secondary | ICD-10-CM

## 2019-11-22 ENCOUNTER — Other Ambulatory Visit: Payer: Self-pay | Admitting: Physician Assistant

## 2019-11-22 DIAGNOSIS — R1084 Generalized abdominal pain: Secondary | ICD-10-CM

## 2019-11-22 DIAGNOSIS — K56609 Unspecified intestinal obstruction, unspecified as to partial versus complete obstruction: Secondary | ICD-10-CM

## 2019-11-23 ENCOUNTER — Ambulatory Visit: Payer: Medicare Other | Admitting: Physical Therapy

## 2019-11-24 DIAGNOSIS — R109 Unspecified abdominal pain: Secondary | ICD-10-CM | POA: Diagnosis not present

## 2019-11-24 DIAGNOSIS — K52831 Collagenous colitis: Secondary | ICD-10-CM | POA: Diagnosis not present

## 2019-11-27 ENCOUNTER — Other Ambulatory Visit: Payer: Self-pay

## 2019-11-27 ENCOUNTER — Ambulatory Visit: Payer: Medicare Other | Attending: Neurological Surgery | Admitting: Physical Therapy

## 2019-11-27 ENCOUNTER — Encounter: Payer: Self-pay | Admitting: Physical Therapy

## 2019-11-27 DIAGNOSIS — M542 Cervicalgia: Secondary | ICD-10-CM | POA: Diagnosis not present

## 2019-11-27 DIAGNOSIS — M5412 Radiculopathy, cervical region: Secondary | ICD-10-CM | POA: Diagnosis not present

## 2019-11-27 DIAGNOSIS — M6281 Muscle weakness (generalized): Secondary | ICD-10-CM | POA: Insufficient documentation

## 2019-11-27 NOTE — Therapy (Signed)
[place] [address] [place], [place], [address] Phone: [phone]   Fax:  [phone]  Physical Therapy Treatment  Patient Details  Name: Lori Alvarez MRN: [id number] Date of Birth: [birth year] Referring Provider (PT): Elsner   Encounter Date: 11/27/2019  PT End of Session - 11/27/19 0630    Visit Number  24    Date for PT Re-Evaluation  12/25/19    PT Start Time  1601    PT Stop Time  1530    PT Time Calculation (min)  51 min    Activity Tolerance  Patient limited by pain    Behavior During Therapy  [facility] for tasks assessed/performed       Past Medical History:  Diagnosis Date  . ALS (amyotrophic lateral sclerosis) ([place])   . Postherpetic neuralgia     Past Surgical History:  Procedure Laterality Date  . APPENDECTOMY    . PARTIAL HYSTERECTOMY      There were no vitals filed for this visit.  Subjective Assessment - 11/27/19 1529    Subjective  Patient has not been here in over 3 weeks, she reports that she has been battling stomach issues and has not felt very good, reports that at times she is doubled over with abdominal pain, Ct scan and x-rays were negative.  Reports that she really needs to return due to pain in the shoulders and neck    Currently in Pain?  Yes    Pain Score  8     Pain Location  Shoulder    Pain Orientation  Right;Left    Pain Descriptors / Indicators  Aching;Spasm;Tightness    Aggravating Factors   stomach issues lately have not allowed her to come in,         [facility] PT Assessment - 11/27/19 0001      Assessment   Medical Diagnosis  cervical stenosis    Referring Provider (PT)  Elsner                   [facility] Adult PT Treatment/Exercise - 11/27/19 0001      Posture/Postural Control   Posture Comments  she comes back with significantly elevated shoulders      Moist Heat Therapy   Number Minutes Moist Heat  15 Minutes    Moist Heat Location  Cervical       Electrical Stimulation   Electrical Stimulation Location  upper trap and into the cervical area    Electrical Stimulation Action  IFC    Electrical Stimulation Parameters  sitting    Electrical Stimulation Goals  Pain      Manual Therapy   Manual Therapy  Soft tissue mobilization;Manual Traction    Soft tissue mobilization  cervical area, upper traps and into the biceps    Manual Traction  occipital release, shoulder depression               PT Short Term Goals - 08/14/19 1426      PT SHORT TERM GOAL #1   Title  independent with initial HEP    Status  Achieved        PT Long Term Goals - 11/27/19 1536      PT LONG TERM GOAL #1   Title  report pain decreased 25%    Status  Partially Met      PT LONG TERM GOAL #2   Title  report that she can do her hair on her  own    Status  Partially Met      PT LONG TERM GOAL #3   Title  increase right grip strength to 25#    Status  On-going      PT LONG TERM GOAL #4   Title  increase cervical ROM 25%    Status  Achieved            Plan - 11/27/19 1533    Clinical Impression Statement  Patient returns to [place] after 3-4 weeks of not coming in due to abdominal issues, she has more elevated shoulders with guarding, she reports increaesd shoulder and neck pain and biceps pain.  She seems to have lost some movements of her arms and is using the upper traps and the neck to compensate    PT Next Visit Plan  would like to start with the exercisds again but she may not tolerate    Consulted and Agree with Plan of Care  Patient       Patient will benefit from skilled therapeutic intervention in order to improve the following deficits and impairments:  Pain, Improper body mechanics, Impaired sensation, Increased muscle spasms, Postural dysfunction, Impaired tone, Impaired UE functional use, Decreased strength, Decreased range of motion, Decreased activity tolerance, Impaired flexibility  Visit Diagnosis: Muscle weakness  (generalized) - Plan: PT plan of care cert/re-cert  Cervicalgia - Plan: PT plan of care cert/re-cert  Radiculopathy, cervical region - Plan: PT plan of care cert/re-cert     Problem List Patient Active Problem List   Diagnosis Date Noted  . Collagenous colitis 07/28/2018  . Decreased sensation of lower extremity 07/20/2018  . ALS (amyotrophic lateral sclerosis) ([place]) 12/23/2017  . Muscle weakness (generalized) 11/23/2017  . Bilateral arm weakness 11/11/2017  . Elevated blood pressure reading without diagnosis of hypertension 11/11/2017  . Post herpetic neuralgia 11/11/2017  . MDS (myelodysplastic syndrome) ([place]) 01/22/2015    Sumner Boast., PT 11/27/2019, 3:38 PM  [place] [address] [place], [place], [address] Phone: [phone]   Fax:  [phone]  Name: Lori Alvarez MRN: [id number] Date of Birth: Sep 09, 1961

## 2019-11-29 ENCOUNTER — Encounter: Payer: Self-pay | Admitting: Physical Therapy

## 2019-11-29 ENCOUNTER — Other Ambulatory Visit: Payer: Self-pay

## 2019-11-29 ENCOUNTER — Ambulatory Visit: Payer: Medicare Other | Admitting: Physical Therapy

## 2019-11-29 DIAGNOSIS — M6281 Muscle weakness (generalized): Secondary | ICD-10-CM | POA: Diagnosis not present

## 2019-11-29 DIAGNOSIS — M5412 Radiculopathy, cervical region: Secondary | ICD-10-CM

## 2019-11-29 DIAGNOSIS — M542 Cervicalgia: Secondary | ICD-10-CM

## 2019-11-29 NOTE — Therapy (Signed)
[place] [address] [place], [place], [address] Phone: [phone]   Fax:  [phone]  Physical Therapy Treatment  Patient Details  Name: Lori Alvarez MRN: [id number] Date of Birth: 10-Jul-1961 Referring Provider (PT): Elsner   Encounter Date: 11/29/2019  PT End of Session - 11/29/19 1525    Visit Number  25    Date for PT Re-Evaluation  12/25/19    PT Start Time  1435    PT Stop Time  1538    PT Time Calculation (min)  63 min    Activity Tolerance  Patient limited by pain    Behavior During Therapy  [facility] for tasks assessed/performed       Past Medical History:  Diagnosis Date  . ALS (amyotrophic lateral sclerosis) ([place])   . Postherpetic neuralgia     Past Surgical History:  Procedure Laterality Date  . APPENDECTOMY    . PARTIAL HYSTERECTOMY      There were no vitals filed for this visit.  Subjective Assessment - 11/29/19 1450    Subjective  Patient reports that she was sore on Monday night but was good yesterday, she is worried about losing the use of her hands    Currently in Pain?  Yes    Pain Score  7     Pain Location  Shoulder    Pain Orientation  Right;Left    Aggravating Factors   compensation                       [facility] Adult PT Treatment/Exercise - 11/29/19 0001      Neck Exercises: Supine   Other Supine Exercise  elevated the head of the bed to about 60 degrees and did assisted sit ups, with band, had a lot of neck compensation    Other Supine Exercise  feet on ball K2C, trunk rotaiton, small bridges and isometric abs.      Hand Exercises for Cervical Radiculopathy   Finger ABduction  15 x    Other Hand Exercise for Cervical Radiculopathy  picking up and trying to stack cones with each hand, open box and take out items and then put them back again with each hand      Moist Heat Therapy   Number Minutes Moist Heat  15 Minutes    Moist Heat Location  Cervical       Electrical Stimulation   Electrical Stimulation Location  upper trap and into the cervical area    Electrical Stimulation Action  IFC    Electrical Stimulation Parameters  sitting    Electrical Stimulation Goals  Pain      Manual Therapy   Manual Therapy  Soft tissue mobilization;Manual Traction    Soft tissue mobilization  cervical area, upper traps and into the biceps               PT Short Term Goals - 08/14/19 1426      PT SHORT TERM GOAL #1   Title  independent with initial HEP    Status  Achieved        PT Long Term Goals - 11/27/19 1536      PT LONG TERM GOAL #1   Title  report pain decreased 25%    Status  Partially Met      PT LONG TERM GOAL #2   Title  report that she can do her hair on her own    Status  Partially Met      PT LONG TERM GOAL #3   Title  increase right grip strength to 25#    Status  On-going      PT LONG TERM GOAL #4   Title  increase cervical ROM 25%    Status  Achieved            Plan - 11/29/19 1525    Clinical Impression Statement  Patient has definitely lost a lot of function in the hands over the past month, less use, unable to lift arms away from the body on the left.  To use the arms she really uses the upper traps to compensate    PT Next Visit Plan  would like to start with the exercise again but she may not tolerate    Consulted and Agree with Plan of Care  Patient       Patient will benefit from skilled therapeutic intervention in order to improve the following deficits and impairments:  Pain, Improper body mechanics, Impaired sensation, Increased muscle spasms, Postural dysfunction, Impaired tone, Impaired UE functional use, Decreased strength, Decreased range of motion, Decreased activity tolerance, Impaired flexibility  Visit Diagnosis: Muscle weakness (generalized)  Cervicalgia  Radiculopathy, cervical region     Problem List Patient Active Problem List   Diagnosis Date Noted  . Collagenous  colitis 07/28/2018  . Decreased sensation of lower extremity 07/20/2018  . ALS (amyotrophic lateral sclerosis) ([place]) 12/23/2017  . Muscle weakness (generalized) 11/23/2017  . Bilateral arm weakness 11/11/2017  . Elevated blood pressure reading without diagnosis of hypertension 11/11/2017  . Post herpetic neuralgia 11/11/2017  . MDS (myelodysplastic syndrome) ([place]) 01/22/2015    Sumner Boast., PT 11/29/2019, 3:27 PM  [place] [address] [place], [place], [address] Phone: [phone]   Fax:  [phone]  Name: Lori Alvarez MRN: [id number] Date of Birth: [birth year]

## 2019-11-30 ENCOUNTER — Ambulatory Visit
Admission: RE | Admit: 2019-11-30 | Discharge: 2019-11-30 | Disposition: A | Discharge status: Home / Self Care | Payer: Medicare Other | Source: Ambulatory Visit | Attending: Physician Assistant | Admitting: Physician Assistant

## 2019-11-30 DIAGNOSIS — R109 Unspecified abdominal pain: Secondary | ICD-10-CM | POA: Diagnosis not present

## 2019-11-30 DIAGNOSIS — K56609 Unspecified intestinal obstruction, unspecified as to partial versus complete obstruction: Secondary | ICD-10-CM

## 2019-11-30 DIAGNOSIS — R1084 Generalized abdominal pain: Secondary | ICD-10-CM

## 2019-11-30 MED ORDER — IOPAMIDOL (ISOVUE-300) INJECTION 61%
100.0000 mL | Freq: Once | INTRAVENOUS | Status: AC | PRN
  Administered 2019-11-30: 15:00:00 100 mL via INTRAVENOUS

## 2019-12-04 ENCOUNTER — Ambulatory Visit: Payer: Medicare Other | Admitting: Physical Therapy

## 2019-12-04 ENCOUNTER — Other Ambulatory Visit: Payer: Self-pay

## 2019-12-04 ENCOUNTER — Encounter: Payer: Self-pay | Admitting: Physical Therapy

## 2019-12-04 DIAGNOSIS — M542 Cervicalgia: Secondary | ICD-10-CM

## 2019-12-04 DIAGNOSIS — M6281 Muscle weakness (generalized): Secondary | ICD-10-CM

## 2019-12-04 DIAGNOSIS — M5412 Radiculopathy, cervical region: Secondary | ICD-10-CM

## 2019-12-04 NOTE — Therapy (Signed)
[address] [place], [place], [address] Phone: [phone]   Fax:  [phone]  Physical Therapy Treatment  Patient Details  Name: MIONNA ADVINCULA MRN: [id number] Date of Birth: [birth year] Referring Provider (PT): Elsner   Encounter Date: 12/04/2019  PT End of Session - 12/04/19 1523    Visit Number  26    Date for PT Re-Evaluation  12/25/19    PT Start Time  2683    PT Stop Time  1530    PT Time Calculation (min)  51 min    Activity Tolerance  Patient limited by pain    Behavior During Therapy  [facility] for tasks assessed/performed       Past Medical History:  Diagnosis Date  . ALS (amyotrophic lateral sclerosis) ([place])   . Postherpetic neuralgia     Past Surgical History:  Procedure Laterality Date  . APPENDECTOMY    . PARTIAL HYSTERECTOMY      There were no vitals filed for this visit.  Subjective Assessment - 12/04/19 1516    Subjective  Patient reports a terrible weekend, she continued with the abdominal issues, reports that she had a CT scan and that the MD told her to go to the ED, she reports that she did not do this but has had high levels of stress with her thinking about a partial bowel obstruction and possible surgery and hospital admit    Currently in Pain?  Yes    Pain Score  8     Pain Location  Neck    Pain Orientation  Right;Left    Aggravating Factors   I got in floor to throw up and could not get up, I am really hurting more                       [facility] Adult PT Treatment/Exercise - 12/04/19 0001      Modalities   Modalities  Electrical Stimulation;Moist Heat      Moist Heat Therapy   Number Minutes Moist Heat  15 Minutes    Moist Heat Location  Cervical      Electrical Stimulation   Electrical Stimulation Location  upper trap and into the cervical area    Electrical Stimulation Action  IFC    Electrical Stimulation Parameters  sitting    Electrical  Stimulation Goals  Pain      Manual Therapy   Manual Therapy  Soft tissue mobilization;Manual Traction    Manual therapy comments  gentle stretches of the upper traps    Soft tissue mobilization  cervical area, upper traps and into the biceps               PT Short Term Goals - 08/14/19 1426      PT SHORT TERM GOAL #1   Title  independent with initial HEP    Status  Achieved        PT Long Term Goals - 11/27/19 1536      PT LONG TERM GOAL #1   Title  report pain decreased 25%    Status  Partially Met      PT LONG TERM GOAL #2   Title  report that she can do her hair on her own    Status  Partially Met      PT LONG TERM GOAL #3   Title  increase right grip strength to 25#    Status  On-going  PT LONG TERM GOAL #4   Title  increase cervical ROM 25%    Status  Achieved            Plan - 12/04/19 1524    Clinical Impression Statement  I would like to try to look at her transfers from the floor with her when she feels she can attempt, she is very very sore today and not doing well today due to all of the stuff of the weekend.  Had significant pain and spasms in the upper trpas, the cervical paraspinals and the rhomboids    PT Next Visit Plan  would like to start with the exercise again but she may not tolerate    Consulted and Agree with Plan of Care  Patient       Patient will benefit from skilled therapeutic intervention in order to improve the following deficits and impairments:  Pain, Improper body mechanics, Impaired sensation, Increased muscle spasms, Postural dysfunction, Impaired tone, Impaired UE functional use, Decreased strength, Decreased range of motion, Decreased activity tolerance, Impaired flexibility  Visit Diagnosis: Muscle weakness (generalized)  Cervicalgia  Radiculopathy, cervical region     Problem List Patient Active Problem List   Diagnosis Date Noted  . Collagenous colitis 07/28/2018  . Decreased sensation of lower  extremity 07/20/2018  . ALS (amyotrophic lateral sclerosis) ([place]) 12/23/2017  . Muscle weakness (generalized) 11/23/2017  . Bilateral arm weakness 11/11/2017  . Elevated blood pressure reading without diagnosis of hypertension 11/11/2017  . Post herpetic neuralgia 11/11/2017  . MDS (myelodysplastic syndrome) ([place]) 01/22/2015    Sumner Boast., PT 12/04/2019, 3:26 PM  [address] [place], [place], [address] Phone: [phone]   Fax:  [phone]  Name: KABREA SEENEY MRN: [id number] Date of Birth: September 18, 1961

## 2019-12-06 ENCOUNTER — Encounter: Payer: BC Managed Care – PPO | Admitting: Physical Therapy

## 2019-12-06 ENCOUNTER — Other Ambulatory Visit: Payer: Self-pay

## 2019-12-06 ENCOUNTER — Inpatient Hospital Stay (HOSPITAL_COMMUNITY)
Admission: AD | Admit: 2019-12-06 | Discharge: 2019-12-09 | DRG: 389 | Disposition: A | Discharge status: Home / Self Care | Payer: Medicare Other | Source: Ambulatory Visit | Attending: General Surgery | Admitting: General Surgery

## 2019-12-06 ENCOUNTER — Observation Stay (HOSPITAL_COMMUNITY): Payer: Medicare Other

## 2019-12-06 ENCOUNTER — Ambulatory Visit (HOSPITAL_COMMUNITY): Payer: Medicare Other

## 2019-12-06 ENCOUNTER — Ambulatory Visit (HOSPITAL_COMMUNITY): Admission: RE | Admit: 2019-12-06 | Payer: Medicare Other | Source: Ambulatory Visit

## 2019-12-06 DIAGNOSIS — Z888 Allergy status to other drugs, medicaments and biological substances status: Secondary | ICD-10-CM

## 2019-12-06 DIAGNOSIS — Z882 Allergy status to sulfonamides status: Secondary | ICD-10-CM

## 2019-12-06 DIAGNOSIS — K5651 Intestinal adhesions [bands], with partial obstruction: Principal | ICD-10-CM | POA: Diagnosis present

## 2019-12-06 DIAGNOSIS — Z6822 Body mass index (BMI) 22.0-22.9, adult: Secondary | ICD-10-CM

## 2019-12-06 DIAGNOSIS — Z791 Long term (current) use of non-steroidal anti-inflammatories (NSAID): Secondary | ICD-10-CM

## 2019-12-06 DIAGNOSIS — K56609 Unspecified intestinal obstruction, unspecified as to partial versus complete obstruction: Secondary | ICD-10-CM | POA: Diagnosis present

## 2019-12-06 DIAGNOSIS — R109 Unspecified abdominal pain: Secondary | ICD-10-CM

## 2019-12-06 DIAGNOSIS — R627 Adult failure to thrive: Secondary | ICD-10-CM | POA: Diagnosis present

## 2019-12-06 DIAGNOSIS — Z20822 Contact with and (suspected) exposure to covid-19: Secondary | ICD-10-CM | POA: Diagnosis present

## 2019-12-06 DIAGNOSIS — B0229 Other postherpetic nervous system involvement: Secondary | ICD-10-CM | POA: Diagnosis present

## 2019-12-06 DIAGNOSIS — K56600 Partial intestinal obstruction, unspecified as to cause: Secondary | ICD-10-CM | POA: Diagnosis not present

## 2019-12-06 DIAGNOSIS — Z881 Allergy status to other antibiotic agents status: Secondary | ICD-10-CM

## 2019-12-06 DIAGNOSIS — Z8051 Family history of malignant neoplasm of kidney: Secondary | ICD-10-CM

## 2019-12-06 DIAGNOSIS — G1221 Amyotrophic lateral sclerosis: Secondary | ICD-10-CM | POA: Diagnosis present

## 2019-12-06 HISTORY — DX: Unspecified intestinal obstruction, unspecified as to partial versus complete obstruction: K56.609

## 2019-12-06 MED ORDER — HEPARIN SODIUM (PORCINE) 5000 UNIT/ML IJ SOLN
5000.0000 [IU] | Freq: Three times a day (TID) | INTRAMUSCULAR | Status: DC
  Filled 2019-12-06: qty 1

## 2019-12-06 MED ORDER — KCL IN DEXTROSE-NACL 20-5-0.45 MEQ/L-%-% IV SOLN
INTRAVENOUS | Status: DC
  Administered 2019-12-06: 125 mL/h via INTRAVENOUS
  Filled 2019-12-06 (×4): qty 1000

## 2019-12-06 MED ORDER — ALPRAZOLAM 0.5 MG PO TABS
0.5000 mg | ORAL_TABLET | Freq: Two times a day (BID) | ORAL | Status: DC | PRN
  Administered 2019-12-06: 0.5 mg via ORAL
  Filled 2019-12-06: qty 1

## 2019-12-06 MED ORDER — PANTOPRAZOLE SODIUM 40 MG IV SOLR
40.0000 mg | Freq: Every day | INTRAVENOUS | Status: DC
  Administered 2019-12-06: 40 mg via INTRAVENOUS
  Filled 2019-12-06: qty 40

## 2019-12-06 MED ORDER — ONDANSETRON HCL 4 MG/2ML IJ SOLN: 4.0000 mg | Freq: Four times a day (QID) | INTRAMUSCULAR | Status: DC | PRN

## 2019-12-06 MED ORDER — FENTANYL CITRATE (PF) 100 MCG/2ML IJ SOLN: 12.5000 ug | INTRAMUSCULAR | Status: DC | PRN

## 2019-12-06 MED ORDER — DULOXETINE HCL 20 MG PO CPEP
20.0000 mg | ORAL_CAPSULE | Freq: Every day | ORAL | Status: DC
  Filled 2019-12-06: qty 1

## 2019-12-06 MED ORDER — PREGABALIN 50 MG PO CAPS
50.0000 mg | ORAL_CAPSULE | Freq: Two times a day (BID) | ORAL | Status: DC
  Administered 2019-12-06 – 2019-12-07 (×2): 50 mg via ORAL
  Filled 2019-12-06 (×2): qty 1

## 2019-12-06 MED ORDER — BACLOFEN 10 MG PO TABS: 10.0000 mg | ORAL_TABLET | Freq: Four times a day (QID) | ORAL | Status: DC | PRN

## 2019-12-06 MED ORDER — ONDANSETRON 4 MG PO TBDP: 4.0000 mg | ORAL_TABLET | Freq: Four times a day (QID) | ORAL | Status: DC | PRN

## 2019-12-06 MED ORDER — BISACODYL 10 MG RE SUPP: 10.0000 mg | Freq: Every day | RECTAL | Status: DC | PRN

## 2019-12-06 NOTE — Progress Notes (Signed)
Went to patient's room to start PIV and the family at this time was changing the patient and asked if I would return later. RN's at the front desk and Nurse Tech made aware

## 2019-12-06 NOTE — H&P (Signed)
Surgical H&P  Chief Complaint: abdominal cramping  HPI: very pleasant 59 year old woman with ALS and history of collagenous colitis/ IBS who is referred by Deliah Goody, PA of [facility] to our office with a recent diagnosis of partial small bowel obstruction.  The patient has been experiencing cramping abdominal pain for just over a month now. When symptoms began she did have to visit the emergency room with 10 out of 10 pain, a CT scan at that time was unrevealing.  Pain is crampy, it is a long the mid abdomen and has continued to be intermittently severe.  It is aggravated by oral intake.  She has continued to have bowel movements through the course of this.  She has tried multiple different medications including pain medication, muscle relaxer, dicyclomine with no benefit, other than when she does not eat. She has not had emesis but does have nausea associated with this as well.  No fevers. This has been attempted to be treated with reinitiating budesonide with no improvement, she was also taking IBguard which did seem to help a little bit. She does seem to have alternating constipation and diarrhea. Initially was suspected that her generalized abdominal cramping was due to constipation versus IBS with colon spasm; however with failure of medications to resolve her symptoms, plain films of the abdomen and subsequently a CT enterography were performed the latter completed on February 11, which all demonstrate findings consistent with a partial small bowel obstruction, possibly due to adhesions in the right lower quadrant.Prior abdominal surgery includes open hysterectomy for an enlarged fibroid uterus, and appendectomy in the 70s. She started a liquid diet a few days ago and has noticed slight improvement with that. She has lost 12-15 pounds over the course of the last month dealing with this.  She has been tolerating Gatorade, water and boost.   Allergies  Allergen Reactions  . Allegra [Fexofenadine]    . Clotrim-Undecyl-Tea Tr-Lav Oil   . Doxepin   . Keflex [Cephalexin]     "too strong, made me drowsy and dizzy"  . Nitroglycerin     Migraine  . Nitrofuran Derivatives Rash  . Sulfa Antibiotics Rash    Past Medical History:  Diagnosis Date  . ALS (amyotrophic lateral sclerosis) ([place])   . Postherpetic neuralgia     Past Surgical History:  Procedure Laterality Date  . APPENDECTOMY    . PARTIAL HYSTERECTOMY      Family History  Problem Relation Age of Onset  . Myopathy Mother   . Cancer Father        renal    Social History   Socioeconomic History  . Marital status: Married    Spouse name: Not on file  . Number of children: 0  . Years of education: 38  . Highest education level: Not on file  Occupational History  . Occupation: [employer]  Tobacco Use  . Smoking status: Never Smoker  . Smokeless tobacco: Never Used  Substance and Sexual Activity  . Alcohol use: No  . Drug use: No  . Sexual activity: Not on file  Other Topics Concern  . Not on file  Social History Narrative   Lives with husband   Right handed    Caffeine use: Coke- 12 oz or less    Social Determinants of Health   Financial Resource Strain:   . Difficulty of Paying Living Expenses: Not on file  Food Insecurity:   . Worried About Charity fundraiser in the Last Year:  Not on file  . Ran Out of Food in the Last Year: Not on file  Transportation Needs:   . Lack of Transportation (Medical): Not on file  . Lack of Transportation (Non-Medical): Not on file  Physical Activity:   . Days of Exercise per Week: Not on file  . Minutes of Exercise per Session: Not on file  Stress:   . Feeling of Stress : Not on file  Social Connections:   . Frequency of Communication with Friends and Family: Not on file  . Frequency of Social Gatherings with Friends and Family: Not on file  . Attends Religious Services: Not on file  . Active Member of Clubs or Organizations: Not on file  . Attends  Archivist Meetings: Not on file  . Marital Status: Not on file    No current facility-administered medications on file prior to encounter.   Current Outpatient Medications on File Prior to Encounter  Medication Sig Dispense Refill  . ALPRAZolam (XANAX) 0.5 MG tablet Take 0.5 mg by mouth as needed for anxiety.    . Black Cohosh (REMIFEMIN) 20 MG TABS Take 20 mg by mouth 2 (two) times daily.    Marland Kitchen CALCIUM-VITAMIN D PO Take 1 tablet by mouth daily. 600-800    . Cyanocobalamin (VITAMIN B-12 PO) Take 500 mcg by mouth daily.    . meloxicam (MOBIC) 15 MG tablet Take 15 mg by mouth daily.    . methocarbamol (ROBAXIN) 500 MG tablet Take 1 tablet (500 mg total) by mouth 2 (two) times daily. 20 tablet 0  . Multiple Vitamins-Minerals (MULTIVITAMIN ADULTS PO) Take 1 tablet by mouth daily.    . ondansetron (ZOFRAN) 4 MG tablet Take 1 tablet (4 mg total) by mouth every 6 (six) hours. 12 tablet 0  . rizatriptan (MAXALT) 10 MG tablet     . VITAMIN E PO Take 400 Units by mouth daily.    . Zinc 50 MG TABS Take 50 mg by mouth daily.      Review of Systems: a complete, 10pt review of systems was completed with pertinent positives and negatives as documented in the HPI  Physical Exam: There were no vitals filed for this visit. Gen: A&Ox3, no distress  Eyes: lids and conjunctivae normal, no icterus. Pupils equally round and reactive to light.  Chest: respiratory effort is normal. No crepitus or tenderness on palpation of the chest. Breath sounds equal.  Cardiovascular: RRR with palpable distal pulses, no pedal edema Gastrointestinal: soft, moderatelydistended, mildly tender without peritoneal signs in the periumbilical and right lower quadrant. No mass, hepatomegaly or splenomegaly. No hernia. Lymphatic: no lymphadenopathy in the neck or groin Muscoloskeletal: muscle wasting and profound weakness to bilateral upper extremities Psych: appropriate mood and affect, normal insight/judgment intact   Skin: warm and dry   CBC Latest Ref Rng & Units 11/01/2019 09/05/2019  WBC 4.0 - 10.5 K/uL 9.4 12.2(H)  Hemoglobin 12.0 - 15.0 g/dL 13.3 13.0  Hematocrit 36.0 - 46.0 % 41.8 39.9  Platelets 150 - 400 K/uL 351 278    CMP Latest Ref Rng & Units 11/01/2019 09/05/2019  Glucose 70 - 99 mg/dL 103(H) 135(H)  BUN 6 - 20 mg/dL 17 17  Creatinine 0.44 - 1.00 mg/dL 0.51 0.68  Sodium 135 - 145 mmol/L 138 132(L)  Potassium 3.5 - 5.1 mmol/L 4.0 3.6  Chloride 98 - 111 mmol/L 103 99  CO2 22 - 32 mmol/L 28 22  Calcium 8.9 - 10.3 mg/dL 9.8 9.1  Total Protein 6.5 -  8.1 g/dL 6.4(L) 7.1  Total Bilirubin 0.3 - 1.2 mg/dL 0.4 0.4  Alkaline Phos 38 - 126 U/L 77 72  AST 15 - 41 U/L 16 24  ALT 0 - 44 U/L 19 23    No results found for: INR, PROTIME  Imaging: No results found.   A/P: partial small bowel obstruction, possibly due to adhesions.  Another consideration is the possibility of autonomic nerve disruption secondary to her ALS contributing to insufficient peristalsis/delayed emptying which could certainly cause the symptoms that she has been having- this has been well described.  However given that we have radiographic evidence of probable partial obstruction I think we have to treat it as such for now. Recommend admission for bowel rest, fluid resuscitation, and possibly small bowel ejection protocol films after she has had at least 24-48 hours of bowel rest.  Since she is not currently nauseous or actively vomiting including can hold off on NG tube, but advised her this still may be necessary if symptoms do not resolve.  I'm hopeful that this will resolve without surgical intervention, and I discussed with her that she is exceptionally high risk for surgery given her overall weakness from ALS.  Questions were welcomed answered.   ADMISSION STATUS: observation  Patient Active Problem List   Diagnosis Date Noted  . Small bowel obstruction ([place]) 12/06/2019  . Collagenous colitis 07/28/2018  .  Decreased sensation of lower extremity 07/20/2018  . ALS (amyotrophic lateral sclerosis) ([place]) 12/23/2017  . Muscle weakness (generalized) 11/23/2017  . Bilateral arm weakness 11/11/2017  . Elevated blood pressure reading without diagnosis of hypertension 11/11/2017  . Post herpetic neuralgia 11/11/2017  . MDS (myelodysplastic syndrome) ([place]) 01/22/2015       Romana Juniper, MD [facility]  See AMION to contact appropriate on-call provider

## 2019-12-07 ENCOUNTER — Encounter (HOSPITAL_COMMUNITY): Payer: Self-pay

## 2019-12-07 ENCOUNTER — Ambulatory Visit: Payer: Medicare Other | Admitting: Psychiatry

## 2019-12-07 DIAGNOSIS — B0229 Other postherpetic nervous system involvement: Secondary | ICD-10-CM | POA: Diagnosis present

## 2019-12-07 DIAGNOSIS — K5651 Intestinal adhesions [bands], with partial obstruction: Secondary | ICD-10-CM | POA: Diagnosis present

## 2019-12-07 DIAGNOSIS — Z882 Allergy status to sulfonamides status: Secondary | ICD-10-CM | POA: Diagnosis not present

## 2019-12-07 DIAGNOSIS — R1084 Generalized abdominal pain: Secondary | ICD-10-CM | POA: Diagnosis not present

## 2019-12-07 DIAGNOSIS — Z881 Allergy status to other antibiotic agents status: Secondary | ICD-10-CM | POA: Diagnosis not present

## 2019-12-07 DIAGNOSIS — Z20822 Contact with and (suspected) exposure to covid-19: Secondary | ICD-10-CM | POA: Diagnosis present

## 2019-12-07 DIAGNOSIS — Z888 Allergy status to other drugs, medicaments and biological substances status: Secondary | ICD-10-CM | POA: Diagnosis not present

## 2019-12-07 DIAGNOSIS — Z8051 Family history of malignant neoplasm of kidney: Secondary | ICD-10-CM | POA: Diagnosis not present

## 2019-12-07 DIAGNOSIS — Z791 Long term (current) use of non-steroidal anti-inflammatories (NSAID): Secondary | ICD-10-CM | POA: Diagnosis not present

## 2019-12-07 DIAGNOSIS — G1221 Amyotrophic lateral sclerosis: Secondary | ICD-10-CM | POA: Diagnosis present

## 2019-12-07 DIAGNOSIS — K5669 Other partial intestinal obstruction: Secondary | ICD-10-CM | POA: Diagnosis not present

## 2019-12-07 DIAGNOSIS — R627 Adult failure to thrive: Secondary | ICD-10-CM | POA: Diagnosis present

## 2019-12-07 DIAGNOSIS — Z6822 Body mass index (BMI) 22.0-22.9, adult: Secondary | ICD-10-CM | POA: Diagnosis not present

## 2019-12-07 DIAGNOSIS — R109 Unspecified abdominal pain: Secondary | ICD-10-CM | POA: Diagnosis not present

## 2019-12-07 LAB — CBC
HCT: 42.6 % (ref 36.0–46.0)
Hemoglobin: 13.6 g/dL (ref 12.0–15.0)
MCH: 28.9 pg (ref 26.0–34.0)
MCHC: 31.9 g/dL (ref 30.0–36.0)
MCV: 90.4 fL (ref 80.0–100.0)
Platelets: 325 10*3/uL (ref 150–400)
RBC: 4.71 MIL/uL (ref 3.87–5.11)
RDW: 16.1 % — ABNORMAL HIGH (ref 11.5–15.5)
WBC: 5.3 10*3/uL (ref 4.0–10.5)
nRBC: 0 % (ref 0.0–0.2)

## 2019-12-07 LAB — COMPREHENSIVE METABOLIC PANEL
ALT: 20 U/L (ref 0–44)
AST: 17 U/L (ref 15–41)
Albumin: 3.8 g/dL (ref 3.5–5.0)
Alkaline Phosphatase: 48 U/L (ref 38–126)
Anion gap: 9 (ref 5–15)
BUN: 9 mg/dL (ref 6–20)
CO2: 25 mmol/L (ref 22–32)
Calcium: 9.4 mg/dL (ref 8.9–10.3)
Chloride: 103 mmol/L (ref 98–111)
Creatinine, Ser: 0.39 mg/dL — ABNORMAL LOW (ref 0.44–1.00)
GFR calc Af Amer: >60 mL/min (ref >60)
GFR calc non Af Amer: >60 mL/min (ref >60)
Glucose, Bld: 126 mg/dL — ABNORMAL HIGH (ref 70–99)
Potassium: 4.3 mmol/L (ref 3.5–5.1)
Sodium: 137 mmol/L (ref 135–145)
Total Bilirubin: 0.5 mg/dL (ref 0.3–1.2)
Total Protein: 6.4 g/dL — ABNORMAL LOW (ref 6.5–8.1)

## 2019-12-07 LAB — PHOSPHORUS: Phosphorus: 3.8 mg/dL (ref 2.5–4.6)

## 2019-12-07 LAB — SARS CORONAVIRUS 2 (TAT 6-24 HRS): SARS Coronavirus 2: NEGATIVE

## 2019-12-07 LAB — MAGNESIUM: Magnesium: 2.1 mg/dL (ref 1.7–2.4)

## 2019-12-07 MED ORDER — DULOXETINE HCL 20 MG PO CPEP
20.0000 mg | ORAL_CAPSULE | Freq: Every evening | ORAL | Status: DC
  Administered 2019-12-07 – 2019-12-08 (×2): 20 mg via ORAL
  Filled 2019-12-07 (×3): qty 1

## 2019-12-07 MED ORDER — DOCUSATE SODIUM 100 MG PO CAPS: 100.0000 mg | ORAL_CAPSULE | Freq: Every day | ORAL | Status: DC | PRN

## 2019-12-07 MED ORDER — BACLOFEN 10 MG PO TABS
10.0000 mg | ORAL_TABLET | Freq: Four times a day (QID) | ORAL | Status: DC
  Administered 2019-12-07 – 2019-12-09 (×10): 10 mg via ORAL
  Filled 2019-12-07 (×11): qty 1

## 2019-12-07 MED ORDER — ALPRAZOLAM 0.25 MG PO TABS: 0.2500 mg | ORAL_TABLET | Freq: Two times a day (BID) | ORAL | Status: DC

## 2019-12-07 MED ORDER — MELOXICAM 15 MG PO TABS
15.0000 mg | ORAL_TABLET | Freq: Every day | ORAL | Status: DC
  Administered 2019-12-07 – 2019-12-09 (×3): 15 mg via ORAL
  Filled 2019-12-07 (×3): qty 1

## 2019-12-07 MED ORDER — ALPRAZOLAM 0.5 MG PO TABS
0.5000 mg | ORAL_TABLET | Freq: Every day | ORAL | Status: DC
  Administered 2019-12-07 – 2019-12-08 (×2): 0.5 mg via ORAL
  Filled 2019-12-07 (×2): qty 1

## 2019-12-07 MED ORDER — PREGABALIN 100 MG PO CAPS
100.0000 mg | ORAL_CAPSULE | Freq: Two times a day (BID) | ORAL | Status: DC
  Administered 2019-12-07 – 2019-12-09 (×4): 100 mg via ORAL
  Filled 2019-12-07 (×5): qty 1

## 2019-12-07 MED ORDER — PREGABALIN 50 MG PO CAPS
50.0000 mg | ORAL_CAPSULE | Freq: Two times a day (BID) | ORAL | Status: DC
  Administered 2019-12-07 – 2019-12-09 (×5): 50 mg via ORAL
  Filled 2019-12-07 (×5): qty 1

## 2019-12-07 MED ORDER — ALPRAZOLAM 0.25 MG PO TABS
0.2500 mg | ORAL_TABLET | Freq: Every morning | ORAL | Status: DC
  Administered 2019-12-07 – 2019-12-09 (×3): 0.25 mg via ORAL
  Filled 2019-12-07 (×3): qty 1

## 2019-12-07 MED ORDER — PREGABALIN 50 MG PO CAPS: 50.0000 mg | ORAL_CAPSULE | Freq: Two times a day (BID) | ORAL | Status: DC

## 2019-12-07 MED ORDER — ADULT MULTIVITAMIN W/MINERALS CH
1.0000 | ORAL_TABLET | Freq: Every day | ORAL | Status: DC
  Administered 2019-12-07 – 2019-12-09 (×2): 1 via ORAL
  Filled 2019-12-07 (×2): qty 1

## 2019-12-07 MED ORDER — PREGABALIN 100 MG PO CAPS: 100.0000 mg | ORAL_CAPSULE | Freq: Two times a day (BID) | ORAL | Status: DC

## 2019-12-07 MED ORDER — PRO-STAT SUGAR FREE PO LIQD
30.0000 mL | Freq: Two times a day (BID) | ORAL | Status: DC
  Filled 2019-12-07: qty 30

## 2019-12-07 MED ORDER — DICYCLOMINE HCL 20 MG PO TABS: 20.0000 mg | ORAL_TABLET | Freq: Every evening | ORAL | Status: DC

## 2019-12-07 MED ORDER — OXYCODONE HCL 5 MG PO TABS
5.0000 mg | ORAL_TABLET | Freq: Four times a day (QID) | ORAL | Status: DC | PRN
  Administered 2019-12-08: 5 mg via ORAL
  Filled 2019-12-07: qty 1

## 2019-12-07 MED ORDER — PREGABALIN 50 MG PO CAPS: 50.0000 mg | ORAL_CAPSULE | Freq: Four times a day (QID) | ORAL | Status: DC

## 2019-12-07 MED ORDER — BOOST / RESOURCE BREEZE PO LIQD CUSTOM
1.0000 | Freq: Three times a day (TID) | ORAL | Status: DC
  Administered 2019-12-09: 15:00:00 1 via ORAL

## 2019-12-07 NOTE — Progress Notes (Addendum)
Subjective: CC: PSBO Patient reports that she does not have any abdominal pain, distension, nausea and denies emesis. She is passing flatus. She had a normal BM yesterday morning.   ROS: See above, otherwise other systems negative   Objective: Vital signs in last 24 hours: Temp:  [97.7 F (36.5 C)-98.1 F (36.7 C)] 97.9 F (36.6 C) (02/18 0414) Pulse Rate:  [90-100] 90 (02/18 0414) Resp:  [16-18] 18 (02/18 0414) BP: (110-131)/(61-74) 110/61 (02/18 0414) SpO2:  [96 %-99 %] 99 % (02/18 0414) Weight:  [57.6 kg] 57.6 kg (02/17 1716)    Intake/Output from previous day: 02/17 0701 - 02/18 0700 In: 1020 [P.O.:20; I.V.:1000] Out: 0  Intake/Output this shift: No intake/output data recorded.  PE: Gen:  Alert, NAD, pleasant Card:  RRR Pulm:  CTAB, no W/R/R, effort normal Abd: Soft, ND, she reports tenderness around her umbilicus. No r/r/g. +BS Ext:  No LE edema  Psych: A&Ox3  Skin: no rashes noted, warm and dry  Lab Results:  Recent Labs    12/07/19 0401  WBC 5.3  HGB 13.6  HCT 42.6  PLT 325   BMET Recent Labs    12/07/19 0401  NA 137  K 4.3  CL 103  CO2 25  GLUCOSE 126*  BUN 9  CREATININE 0.39*  CALCIUM 9.4   PT/INR No results for input(s): LABPROT, INR in the last 72 hours. CMP     Component Value Date/Time   NA 137 12/07/2019 0401   K 4.3 12/07/2019 0401   CL 103 12/07/2019 0401   CO2 25 12/07/2019 0401   GLUCOSE 126 (H) 12/07/2019 0401   BUN 9 12/07/2019 0401   CREATININE 0.39 (L) 12/07/2019 0401   CALCIUM 9.4 12/07/2019 0401   PROT 6.4 (L) 12/07/2019 0401   ALBUMIN 3.8 12/07/2019 0401   AST 17 12/07/2019 0401   ALT 20 12/07/2019 0401   ALKPHOS 48 12/07/2019 0401   BILITOT 0.5 12/07/2019 0401   GFRNONAA >60 12/07/2019 0401   GFRAA >60 12/07/2019 0401   Lipase     Component Value Date/Time   LIPASE 21 11/01/2019 1147       Studies/Results: DG Abd 1 View  Result Date: 12/06/2019 CLINICAL DATA:  59 year old female with small  bowel obstruction. Follow-up radiograph. EXAM: ABDOMEN - 1 VIEW COMPARISON:  CT abdomen pelvis dated 11/30/2019. Abdominal radiograph dated 11/20/2019. FINDINGS: There is no bowel dilatation or evidence of obstruction. Air is noted within the colon. No free air or radiopaque calculi identified. There is mild eventration of the right hemidiaphragm. The osseous structures and soft tissues are unremarkable. IMPRESSION: No evidence of bowel obstruction. Electronically Signed   By: Anner Crete M.D.   On: 12/06/2019 18:51    Anti-infectives: Anti-infectives (From admission, onward)   None       Assessment/Plan ALS  PSBO - Hx of open hysterectomy for an enlarged fibroid uterus, and appendectomy in the 70s - CT enterography 2/11 w/ Mildly dilated proximal and mid distal small bowel loops with air-fluid levels suggesting partial small bowel obstruction with transition points in the right lower quadrant/pelvis - Per Dr. Ron Parker note, suspect likely due to adhesions. Another consideration is the possibility of autonomic nerve disruption secondary to her ALS contributing to insufficient peristalsis/delayed emptying which could certainly cause the symptoms that she has been having - Patient is currently without any reported abdominal pain, n/v and continues to have bowel function. Xray yesterday without evidence of obstruction.  - Will discuss  plan with attending. Patient was tolerating liquid diet before hospitalization but having cramping abdominal pain and had lost 12-15 lbs over the last month dealing with this. Per Dr. Ron Parker note, plan for possible small bowel obstruction protocol films after she has had at least 24-48 hours of bowel rest.  - Can continue to hold off on NGT for now. Can insert NGT if patient develops n/v.  - Keep K >4 and Mg > 2 for bowel function  FEN - NPO, IVF VTE - SCDs, Lovenox ID - None   LOS: 0 days    Jillyn Ledger , [facility]  Surgery 12/07/2019, 9:23 AM Please see Amion for pager number during day hours 7:00am-4:30pm

## 2019-12-07 NOTE — Progress Notes (Signed)
Initial Nutrition Assessment  RD working remotely.   DOCUMENTATION CODES:   Not applicable  INTERVENTION:  - will order Boost Breeze TID, each supplement provides 250 kcal and 9 grams of protein. - will order 30 mL Prostat BID, each supplement provides 100 kcal and 15 grams of protein. - will order daily multivitamin with minerals. - continue to encourage PO intakes as tolerated and advance diet as medically feasible.    NUTRITION DIAGNOSIS:   Inadequate protein intake related to other (see comment)(current diet order) as evidenced by other (comment)(CLD does not meet estimated protein needs.).  GOAL:   Patient will meet greater than or equal to 90% of their needs  MONITOR:   PO intake, Supplement acceptance, Diet advancement, Labs, Weight trends  REASON FOR ASSESSMENT:   Malnutrition Screening Tool  ASSESSMENT:   59 year old female with medical history of ALS and collagenous colitis/ IBS. She was recently diagnosed with partial SBO. She has been experiencing cramping abdominal pain for just over a month which is worsened/aggravated by oral intake. She has continued to have BMs. She has been having nausea without vomiting. She began consuming a liquid-only diet several days ago which has provided slight improvement. She reported 12-15 lb weight loss over the past 1 month. She has mainly been consuming Gatorade, water, and Boost.  Diet advanced from NPO to CLD today at 1000. No intakes since that time. Patient has not had a full meal for over 1 month d/t abdominal pain which is consistently worsened by PO intakes. In the past 1 week she has only been consuming liquids. She has been drinking 1-2 Boost supplements/day. Liquids have not caused as much discomfort as solid foods, but she was mainly only able to sip on them throughout the day.   Patient reported 12-15 lb weight loss in the past 1 month. Per chart review, weight yesterday was 127 lb and weight on 09/05/19 was 142 lb.  This indicates 15 lb weight loss (10% body weight) in the past 3 months; significant for time frame.  Per notes: - xray on admission indicated SBO resolving - diet advanced to CLD and plan to advance to FLD later today, if feasible   Labs reviewed; creatinine: 0.39 mg/dl. Medications reviewed. IVF; D5-1/2 NS-20 mEq KCl @ 75 ml/hr (306 kcal).     NUTRITION - FOCUSED PHYSICAL EXAM:  unable to complete at this time.   Diet Order:   Diet Order            Diet clear liquid Room service appropriate? Yes; Fluid consistency: Thin  Diet effective now              EDUCATION NEEDS:   No education needs have been identified at this time  Skin:  Skin Assessment: Reviewed RN Assessment  Last BM:  PTA/unknown  Height:   Ht Readings from Last 1 Encounters:  12/06/19 5\' 3"  (1.6 m)    Weight:   Wt Readings from Last 1 Encounters:  12/06/19 57.6 kg    Ideal Body Weight:  52.3 kg  BMI:  Body mass index is 22.5 kg/m.  Estimated Nutritional Needs:   Kcal:  1730-2015 kcal  Protein:  75-85 grams  Fluid:  >/= 2 L/day    Jarome Matin, MS, RD, LDN, CNSC Inpatient Clinical Dietitian RD pager # available in AMION  After hours/weekend pager # available in [facility]

## 2019-12-07 NOTE — Progress Notes (Signed)
No issues overnight, no further cramping. Has been about 24h of bowel rest. Abdomen soft, remains minimally tender on right side to deep palpation. Admission xray with resolution of dilation noted on previous imaging. Labs unremarkable. Vitals normal. Agree with plan for clears, advance to fulls later today, if pain recurs start SBO protocol.

## 2019-12-08 ENCOUNTER — Ambulatory Visit: Payer: Medicare Other | Admitting: Psychiatry

## 2019-12-08 ENCOUNTER — Encounter (HOSPITAL_COMMUNITY): Payer: Self-pay

## 2019-12-08 MED ORDER — ENOXAPARIN SODIUM 40 MG/0.4ML ~~LOC~~ SOLN
40.0000 mg | SUBCUTANEOUS | Status: DC
  Filled 2019-12-08 (×2): qty 0.4

## 2019-12-08 NOTE — Discharge Instructions (Signed)
Bowel Obstruction A bowel obstruction is a blockage in the small or large bowel. The bowel, which is also called the intestine, is a long, slender tube that connects the stomach to the anus. When a person eats and drinks, food and fluids go from the mouth to the stomach to the small bowel. This is where most of the nutrients in the food and fluids are absorbed. After the small bowel, material passes through the large bowel for further absorption until any leftover material leaves the body as stool through the anus during a bowel movement. A bowel obstruction will prevent food and fluids from passing through the bowel as they normally do during digestion. The bowel can become partially or completely blocked. If this condition is not treated, it can be dangerous because the bowel could rupture. What are the causes? Common causes of this condition include:  Scar tissue (adhesions) from previous surgery or treatment with high-energy X-rays (radiation).  Recent surgery. This may cause the movements of the bowel to slow down and cause food to block the intestine.  Inflammatory bowel disease, such as Crohn's disease or diverticulitis.  Growths or tumors.  A bulging organ (hernia).  Twisting of the bowel (volvulus).  A foreign body.  Slipping of a part of the bowel into another part (intussusception). What are the signs or symptoms? Symptoms of this condition include:  Pain in the abdomen. Depending on the degree of obstruction, pain may be: ? Mild or severe. ? Dull cramping or sharp pain. ? In one area or in the entire abdomen.  Nausea and vomiting. Vomit may be greenish or a yellow bile color.  Bloating in the abdomen.  Difficulty passing stool (constipation).  Lack of passing gas.  Frequent belching.  Diarrhea. This may occur if the obstruction is partial and runny stool is able to leak around the obstruction. How is this diagnosed? This condition may be diagnosed based on:  A  physical exam.  Medical history.  Imaging tests of the abdomen or pelvis, such as X-ray or CT scan.  Blood or urine tests. How is this treated? Treatment for this condition depends on the cause and severity of the problem. Treatment may include:  Fluids and pain medicines that are given through an IV. Your health care provider may instruct you not to eat or drink if you have nausea or vomiting.  Eating a simple diet. You may be asked to consume a clear liquid diet for several days. This allows the bowel to rest.  Placement of a small tube (nasogastric tube) into the stomach. This will relieve pain, discomfort, and nausea by removing blocked air and fluids from the stomach. It can also help the obstruction clear up faster.  Surgery. This may be required if other treatments do not work. Surgery may be required for: ? Bowel obstruction from a hernia. This can be an emergency procedure. ? Scar tissue that causes frequent or severe obstructions. Follow these instructions at home: Medicines  Take over-the-counter and prescription medicines only as told by your health care provider.  If you were prescribed an antibiotic medicine, take it as told by your health care provider. Do not stop taking the antibiotic even if you start to feel better. General instructions  Follow instructions from your health care provider about eating restrictions. You may need to avoid solid foods and consume only clear liquids until your condition improves.  Return to your normal activities as told by your health care provider. Ask your health care   provider what activities are safe for you.  Avoid sitting for a long time without moving. Get up to take short walks every 1-2 hours. This is important to improve blood flow and breathing. Ask for help if you feel weak or unsteady.  Keep all follow-up visits as told by your health care provider. This is important. How is this prevented? After having a bowel  obstruction, you are more likely to have another. You may do the following things to prevent another obstruction:  If you have a long-term (chronic) disease, pay attention to your symptoms and contact your health care provider if you have questions or concerns.  Avoid becoming constipated. To prevent or treat constipation, your health care provider may recommend that you: ? Drink enough fluid to keep your urine pale yellow. ? Take over-the-counter or prescription medicines. ? Eat foods that are high in fiber, such as beans, whole grains, and fresh fruits and vegetables. ? Limit foods that are high in fat and processed sugars, such as fried or sweet foods.  Stay active. Exercise for 30 minutes or more, 5 or more days each week. Ask your health care provider which exercises are safe for you.  Avoid stress. Find ways to reduce stress, such as meditation, exercise, or taking time for activities that relax you.  Instead of eating three large meals each day, eat three small meals with three small snacks.  Work with a dietitian to make a healthy meal plan that works for you.  Do not use any products that contain nicotine or tobacco, such as cigarettes and e-cigarettes. If you need help quitting, ask your health care provider. Contact a health care provider if you:  Have a fever.  Have chills. Get help right away if you:  Have increased pain or cramping.  Vomit blood.  Have uncontrolled vomiting or nausea.  Cannot drink fluids because of vomiting or pain.  Become confused.  Begin feeling very thirsty (dehydrated).  Have severe bloating.  Feel extremely weak or you faint. Summary  A bowel obstruction is a blockage in the small or large bowel.  A bowel obstruction will prevent food and fluids from passing through the bowel as they normally do during digestion.  Treatment for this condition depends on the cause and severity of the problem. It may include fluids and pain medicines  through an IV, a simple diet, a nasogastric tube, or surgery.  Follow instructions from your health care provider about eating restrictions. You may need to avoid solid foods and consume only clear liquids until your condition improves. This information is not intended to replace advice given to you by your health care provider. Make sure you discuss any questions you have with your health care provider. Document Revised: 11/11/2018 Document Reviewed: 02/16/2018 Elsevier Patient Education  2020 [employer]  

## 2019-12-08 NOTE — Plan of Care (Signed)

## 2019-12-08 NOTE — Progress Notes (Signed)
       Subjective: CC: Doing well. No abdominal pain yesterday or this morning. Tolerating FLD without n/v. Passing flatus. No BM since 2/17  Objective: Vital signs in last 24 hours: Temp:  [97.8 F (36.6 C)-98.2 F (36.8 C)] 97.8 F (36.6 C) (02/19 0544) Pulse Rate:  [86-103] 86 (02/19 0544) Resp:  [14-18] 18 (02/19 0544) BP: (117-121)/(64-72) 117/64 (02/19 0544) SpO2:  [97 %-99 %] 97 % (02/19 0544) Last BM Date: 12/06/19  Intake/Output from previous day: 02/18 0701 - 02/19 0700 In: 2220 [P.O.:600; I.V.:1620] Out: 390 [Urine:390] Intake/Output this shift: No intake/output data recorded.  PE: Gen:  Alert, NAD, pleasant Card:  RRR Pulm:  CTAB, no W/R/R, effort normal Abd: Soft, NT/ND, +BS Psych: A&Ox3  Skin: no rashes noted, warm and dry  Lab Results:  Recent Labs    12/07/19 0401  WBC 5.3  HGB 13.6  HCT 42.6  PLT 325   BMET Recent Labs    12/07/19 0401  NA 137  K 4.3  CL 103  CO2 25  GLUCOSE 126*  BUN 9  CREATININE 0.39*  CALCIUM 9.4   PT/INR No results for input(s): LABPROT, INR in the last 72 hours. CMP     Component Value Date/Time   NA 137 12/07/2019 0401   K 4.3 12/07/2019 0401   CL 103 12/07/2019 0401   CO2 25 12/07/2019 0401   GLUCOSE 126 (H) 12/07/2019 0401   BUN 9 12/07/2019 0401   CREATININE 0.39 (L) 12/07/2019 0401   CALCIUM 9.4 12/07/2019 0401   PROT 6.4 (L) 12/07/2019 0401   ALBUMIN 3.8 12/07/2019 0401   AST 17 12/07/2019 0401   ALT 20 12/07/2019 0401   ALKPHOS 48 12/07/2019 0401   BILITOT 0.5 12/07/2019 0401   GFRNONAA >60 12/07/2019 0401   GFRAA >60 12/07/2019 0401   Lipase     Component Value Date/Time   LIPASE 21 11/01/2019 1147       Studies/Results: DG Abd 1 View  Result Date: 12/06/2019 CLINICAL DATA:  59 year old female with small bowel obstruction. Follow-up radiograph. EXAM: ABDOMEN - 1 VIEW COMPARISON:  CT abdomen pelvis dated 11/30/2019. Abdominal radiograph dated 11/20/2019. FINDINGS: There is no bowel  dilatation or evidence of obstruction. Air is noted within the colon. No free air or radiopaque calculi identified. There is mild eventration of the right hemidiaphragm. The osseous structures and soft tissues are unremarkable. IMPRESSION: No evidence of bowel obstruction. Electronically Signed   By: Anner Crete M.D.   On: 12/06/2019 18:51    Anti-infectives: Anti-infectives (From admission, onward)   None       Assessment/Plan ALS  PSBO - Hx of open hysterectomy for an enlarged fibroid uterus, and appendectomy in the 70s - CT enterography 2/11 w/ Mildly dilated proximal and mid distal small bowel loops with air-fluid levels suggesting partial small bowel obstruction with transition points in the right lower quadrant/pelvis - Tolerating FLD without abdominal pain, n/v. Having bowel function. Adv diet - Possible d/c later today   FEN - Soft VTE - SCDs, Lovenox ID - None   LOS: 1 day    Jillyn Ledger , [facility] 12/08/2019, 7:55 AM Please see Amion for pager number during day hours 7:00am-4:30pm

## 2019-12-09 ENCOUNTER — Inpatient Hospital Stay (HOSPITAL_COMMUNITY): Payer: Medicare Other

## 2019-12-09 NOTE — Progress Notes (Signed)
   Subjective/Chief Complaint: Pt reports intermittent cramping abdominal pain yesterday and evening. Passing flatus but no BM No nausea or vomiting   Objective: Vital signs in last 24 hours: Temp:  [97.6 F (36.4 C)-98.3 F (36.8 C)] 97.6 F (36.4 C) (02/20 0626) Pulse Rate:  [80-98] 80 (02/20 0626) Resp:  [12-16] 12 (02/20 0626) BP: (116-146)/(65-69) 117/65 (02/20 0626) SpO2:  [95 %-99 %] 98 % (02/20 0626) Last BM Date: 12/06/19  Intake/Output from previous day: 02/19 0701 - 02/20 0700 In: 2250.7 [P.O.:540; I.V.:1710.7] Out: -  Intake/Output this shift: No intake/output data recorded.  Exam: Awake and alert Abdomen soft, minimally tender  Lab Results:  Recent Labs    12/07/19 0401  WBC 5.3  HGB 13.6  HCT 42.6  PLT 325   BMET Recent Labs    12/07/19 0401  NA 137  K 4.3  CL 103  CO2 25  GLUCOSE 126*  BUN 9  CREATININE 0.39*  CALCIUM 9.4   PT/INR No results for input(s): LABPROT, INR in the last 72 hours. ABG No results for input(s): PHART, HCO3 in the last 72 hours.  Invalid input(s): PCO2, PO2  Studies/Results: No results found.  Anti-infectives: Anti-infectives (From admission, onward)   None      Assessment/Plan: ALS  PSBO  Will continue current care, watchful waiting.  If she develops nausea and vomiting, will start the small bowel protocol.  Hopefully will improve today. Will hold on a suppository for now Repeat films in the morning   LOS: 2 days    Coralie Keens 12/09/2019

## 2019-12-09 NOTE — Discharge Summary (Signed)
Patient ID: Lori Alvarez [id number] y.o. 01/10/1961  12/06/2019  Discharge date and time: 12/09/2019  5:19 PM  Admitting Physician: Rosario Adie  Discharge Physician: Rosario Adie  Admission Diagnoses: SBO (small bowel obstruction) [facility]) [id number]  Discharge Diagnoses: SBO  Operations: none    Discharged Condition: good    Hospital Course: Patient was admitted to the hospital from the clinic due to failure to thrive at home with symptoms concerning for small bowel obstruction.  She was admitted to the hospital and made n.p.o.  By the following day her pain had resolved and she was having no further nausea and vomiting.  We decided to advance her diet.  This was done slowly over approximately 48 hours.  She developed little to no recurrent abdominal pain and was tolerating a soft diet on day of discharge.  Abdominal films on admission and discharge show no signs of obstruction.  Patient was discharged in stable condition.  Consults: None  Significant Diagnostic Studies: labs: cbc, bmet  Treatments: IV hydration  Disposition: Home

## 2019-12-11 ENCOUNTER — Encounter: Payer: BC Managed Care – PPO | Admitting: Physical Therapy

## 2019-12-13 ENCOUNTER — Encounter: Payer: BC Managed Care – PPO | Admitting: Physical Therapy

## 2019-12-18 ENCOUNTER — Encounter: Payer: Self-pay | Admitting: Physical Therapy

## 2019-12-18 ENCOUNTER — Ambulatory Visit: Payer: Medicare Other | Attending: Neurological Surgery | Admitting: Physical Therapy

## 2019-12-18 ENCOUNTER — Other Ambulatory Visit: Payer: Self-pay

## 2019-12-18 DIAGNOSIS — M6281 Muscle weakness (generalized): Secondary | ICD-10-CM | POA: Diagnosis not present

## 2019-12-18 DIAGNOSIS — M5412 Radiculopathy, cervical region: Secondary | ICD-10-CM | POA: Insufficient documentation

## 2019-12-18 DIAGNOSIS — M542 Cervicalgia: Secondary | ICD-10-CM | POA: Diagnosis not present

## 2019-12-18 NOTE — Therapy (Signed)
[address] [place], [place], [address] Phone: [phone]   Fax:  [phone]  Physical Therapy Treatment  Patient Details  Name: Lori Alvarez MRN: [id number] Date of Birth: 1961/06/16 Referring Provider (PT): Elsner   Encounter Date: 12/18/2019  PT End of Session - 12/18/19 1603    Visit Number  27    Date for PT Re-Evaluation  12/25/19    PT Start Time  1436    PT Stop Time  1533    PT Time Calculation (min)  57 min    Activity Tolerance  Patient limited by pain    Behavior During Therapy  [facility] for tasks assessed/performed       Past Medical History:  Diagnosis Date  . ALS (amyotrophic lateral sclerosis) ([place])   . Postherpetic neuralgia     Past Surgical History:  Procedure Laterality Date  . APPENDECTOMY    . PARTIAL HYSTERECTOMY      There were no vitals filed for this visit.  Subjective Assessment - 12/18/19 1446    Subjective  Patient was in the hospital for about 3 days due to the stomach issues, she feels that this is mostly resolved but is very weak, she is still not full on foods, still liquid mostly    Currently in Pain?  Yes    Pain Score  4     Pain Location  Neck    Aggravating Factors   really sore with the stress                       [facility] Adult PT Treatment/Exercise - 12/18/19 0001      Neck Exercises: Machines for Strengthening   Nustep  level 1 x 2 minutes      Neck Exercises: Theraband   Other Theraband Exercises  yellow tband triceps      Neck Exercises: Seated   Other Seated Exercise  LAQ 2.5 #, marches 2.5#, red tband HS curls, hand slides on thighs      Moist Heat Therapy   Number Minutes Moist Heat  15 Minutes    Moist Heat Location  Cervical      Electrical Stimulation   Electrical Stimulation Location  upper trap and into the cervical area    Electrical Stimulation Action  IFC    Electrical Stimulation Parameters  sitting    Electrical Stimulation Goals  Pain      Manual Therapy   Manual Therapy  Soft tissue mobilization    Soft tissue mobilization  cervical area and the upper traps into the rhomboids, very tight and tender and she asked to be easy due to pain               PT Short Term Goals - 08/14/19 1426      PT SHORT TERM GOAL #1   Title  independent with initial HEP    Status  Achieved        PT Long Term Goals - 12/18/19 1605      PT LONG TERM GOAL #1   Title  report pain decreased 25%    Status  Partially Met      PT LONG TERM GOAL #2   Title  report that she can do her hair on her own    Status  Partially Met      PT LONG TERM GOAL #3   Title  increase right grip strength to 25#  Status  On-going            Plan - 12/18/19 1603    Clinical Impression Statement  Patient has been gone for over 2 weeks due to stomach issues, she was admitted to the hospital, everything is mostly resolved, she reports that she was in bed for the 3 days at the hospital and is very worried about her mms and the ALS as she feels that her arms are really not working again.  She has significant tightness and tenderness in the upper traps    PT Next Visit Plan  would like to start with the exercise again but she may not tolerate       Patient will benefit from skilled therapeutic intervention in order to improve the following deficits and impairments:  Pain, Improper body mechanics, Impaired sensation, Increased muscle spasms, Postural dysfunction, Impaired tone, Impaired UE functional use, Decreased strength, Decreased range of motion, Decreased activity tolerance, Impaired flexibility  Visit Diagnosis: Muscle weakness (generalized)  Cervicalgia  Radiculopathy, cervical region     Problem List Patient Active Problem List   Diagnosis Date Noted  . Small bowel obstruction ([place]) 12/06/2019  . SBO (small bowel obstruction) ([place]) 12/06/2019  . Collagenous colitis 07/28/2018  . Decreased  sensation of lower extremity 07/20/2018  . ALS (amyotrophic lateral sclerosis) ([place]) 12/23/2017  . Muscle weakness (generalized) 11/23/2017  . Bilateral arm weakness 11/11/2017  . Elevated blood pressure reading without diagnosis of hypertension 11/11/2017  . Post herpetic neuralgia 11/11/2017  . MDS (myelodysplastic syndrome) ([place]) 01/22/2015    Sumner Boast., PT 12/18/2019, 4:06 PM  [address] [place], [place], [address] Phone: [phone]   Fax:  [phone]  Name: Lori Alvarez MRN: [id number] Date of Birth: October 27, 1960

## 2019-12-20 ENCOUNTER — Ambulatory Visit: Payer: Medicare Other | Admitting: Physical Therapy

## 2019-12-20 ENCOUNTER — Other Ambulatory Visit: Payer: Self-pay

## 2019-12-20 ENCOUNTER — Encounter: Payer: Self-pay | Admitting: Physical Therapy

## 2019-12-20 DIAGNOSIS — M5412 Radiculopathy, cervical region: Secondary | ICD-10-CM

## 2019-12-20 DIAGNOSIS — M542 Cervicalgia: Secondary | ICD-10-CM

## 2019-12-20 DIAGNOSIS — M6281 Muscle weakness (generalized): Secondary | ICD-10-CM | POA: Diagnosis not present

## 2019-12-20 NOTE — Therapy (Signed)
[address] [place], [place], [address] Phone: [phone]   Fax:  [phone]  Physical Therapy Treatment  Patient Details  Name: Lori Alvarez MRN: [id number] Date of Birth: 06/15/1961 Referring Provider (PT): Elsner   Encounter Date: 12/20/2019  PT End of Session - 12/20/19 1530    Visit Number  28    Date for PT Re-Evaluation  12/25/19    PT Start Time  1436    PT Stop Time  1533    PT Time Calculation (min)  57 min    Activity Tolerance  Patient limited by pain    Behavior During Therapy  [facility] for tasks assessed/performed       Past Medical History:  Diagnosis Date  . ALS (amyotrophic lateral sclerosis) ([place])   . Postherpetic neuralgia     Past Surgical History:  Procedure Laterality Date  . APPENDECTOMY    . PARTIAL HYSTERECTOMY      There were no vitals filed for this visit.  Subjective Assessment - 12/20/19 1447    Subjective  I was suprisingly good.  Unsure if I can do much but I am ready to try some things    Currently in Pain?  Yes    Pain Score  3     Pain Location  Neck                       [facility] Adult PT Treatment/Exercise - 12/20/19 0001      Neck Exercises: Machines for Strengthening   Other Machines for Strengthening  leg extension 5# 2x 10, leg flexion 20#  2x 10, leg press 15# x 10    Other Machines for Strengthening  5# triceps, 5# rows with a little assist      Neck Exercises: Standing   Other Standing Exercises  used the computer cart and had her push and pull 10 x with each arm working on hand, elbow and shoulder use as well as trunk stability      Moist Heat Therapy   Number Minutes Moist Heat  15 Minutes    Moist Heat Location  Cervical      Electrical Stimulation   Electrical Stimulation Location  upper trap and into the cervical area    Electrical Stimulation Action  IFC    Electrical Stimulation Parameters  sitting    Electrical  Stimulation Goals  Pain      Manual Therapy   Manual Therapy  Soft tissue mobilization    Manual therapy comments  gentle stretches of the upper traps    Soft tissue mobilization  cervical area and the upper traps into the rhomboids, very tight and tender and she asked to be easy due to pain               PT Short Term Goals - 08/14/19 1426      PT SHORT TERM GOAL #1   Title  independent with initial HEP    Status  Achieved        PT Long Term Goals - 12/20/19 1533      PT LONG TERM GOAL #1   Title  report pain decreased 25%    Status  Partially Met            Plan - 12/20/19 1531    Clinical Impression Statement  Patient di well with a return to exercises, did not want to do anything that may  cause stomach issues.  She was able to use the UE's better today, she was able to do the legs well but does fatigue easily.  She is still very tight in the upper traps and the neck    PT Next Visit Plan  ease into the exercises    Consulted and Agree with Plan of Care  Patient       Patient will benefit from skilled therapeutic intervention in order to improve the following deficits and impairments:  Pain, Improper body mechanics, Impaired sensation, Increased muscle spasms, Postural dysfunction, Impaired tone, Impaired UE functional use, Decreased strength, Decreased range of motion, Decreased activity tolerance, Impaired flexibility  Visit Diagnosis: Muscle weakness (generalized)  Cervicalgia  Radiculopathy, cervical region     Problem List Patient Active Problem List   Diagnosis Date Noted  . Small bowel obstruction ([place]) 12/06/2019  . SBO (small bowel obstruction) ([place]) 12/06/2019  . Collagenous colitis 07/28/2018  . Decreased sensation of lower extremity 07/20/2018  . ALS (amyotrophic lateral sclerosis) ([place]) 12/23/2017  . Muscle weakness (generalized) 11/23/2017  . Bilateral arm weakness 11/11/2017  . Elevated blood pressure reading without diagnosis of  hypertension 11/11/2017  . Post herpetic neuralgia 11/11/2017  . MDS (myelodysplastic syndrome) ([place]) 01/22/2015    Sumner Boast., PT 12/20/2019, 3:33 PM  [address] [place], [place], [address] Phone: [phone]   Fax:  [phone]  Name: Lori Alvarez MRN: [id number] Date of Birth: 07-04-1961

## 2019-12-22 DIAGNOSIS — R109 Unspecified abdominal pain: Secondary | ICD-10-CM | POA: Diagnosis not present

## 2019-12-22 DIAGNOSIS — Z8719 Personal history of other diseases of the digestive system: Secondary | ICD-10-CM | POA: Diagnosis not present

## 2019-12-22 DIAGNOSIS — K5900 Constipation, unspecified: Secondary | ICD-10-CM | POA: Diagnosis not present

## 2019-12-22 DIAGNOSIS — K52831 Collagenous colitis: Secondary | ICD-10-CM | POA: Diagnosis not present

## 2019-12-25 ENCOUNTER — Encounter: Payer: Medicare Other | Admitting: Physical Therapy

## 2019-12-27 ENCOUNTER — Encounter: Payer: Self-pay | Admitting: Physical Therapy

## 2019-12-27 ENCOUNTER — Other Ambulatory Visit: Payer: Self-pay

## 2019-12-27 ENCOUNTER — Ambulatory Visit: Payer: Medicare Other | Admitting: Physical Therapy

## 2019-12-27 DIAGNOSIS — M6281 Muscle weakness (generalized): Secondary | ICD-10-CM | POA: Diagnosis not present

## 2019-12-27 DIAGNOSIS — M5412 Radiculopathy, cervical region: Secondary | ICD-10-CM

## 2019-12-27 DIAGNOSIS — M542 Cervicalgia: Secondary | ICD-10-CM

## 2019-12-27 NOTE — Therapy (Signed)
[place] [place] [address] [place], [place], [address] Phone: [phone]   Fax:  [phone]  Physical Therapy Treatment  Patient Details  Name: Lori Alvarez MRN: [id number] Date of Birth: 07-Jul-1961 Referring Provider (PT): Elsner   Encounter Date: 12/27/2019  PT End of Session - 12/27/19 1528    Visit Number  29    Date for PT Re-Evaluation  01/27/20    PT Start Time  1435    PT Stop Time  1533    PT Time Calculation (min)  58 min    Activity Tolerance  Patient limited by pain    Behavior During Therapy  [facility] for tasks assessed/performed       Past Medical History:  Diagnosis Date  . ALS (amyotrophic lateral sclerosis) ([place])   . Postherpetic neuralgia     Past Surgical History:  Procedure Laterality Date  . APPENDECTOMY    . PARTIAL HYSTERECTOMY      There were no vitals filed for this visit.  Subjective Assessment - 12/27/19 1525    Subjective  Reports that she is staill battling stomach issues, had to cancel once this week.    Currently in Pain?  Yes    Pain Score  5     Pain Location  Neck    Pain Orientation  Right;Left         [facility] PT Assessment - 12/27/19 0001      Assessment   Medical Diagnosis  cervical stenosis    Referring Provider (PT)  Elsner                   [facility] Adult PT Treatment/Exercise - 12/27/19 0001      Neck Exercises: Machines for Strengthening   Nustep  Level 2 x 5 minutes      Neck Exercises: Seated   Other Seated Exercise  ball b/n knees squeeze    Other Seated Exercise  sliding board arm motioins, LAQ 3#, cone grasp and reach and stack for coordination, really needs some assist with the left UE      Hand Exercises for Cervical Radiculopathy   Finger ABduction  15 x      Moist Heat Therapy   Number Minutes Moist Heat  15 Minutes    Moist Heat Location  Cervical      Electrical Stimulation   Electrical Stimulation Location  upper trap and  into the cervical area    Electrical Stimulation Action  IFC    Electrical Stimulation Parameters  sitting    Electrical Stimulation Goals  Pain      Manual Therapy   Manual Therapy  Soft tissue mobilization    Manual therapy comments  gentle stretches of the upper traps    Soft tissue mobilization  cervical area and the upper traps into the rhomboids, very tight and tender and she asked to be easy due to pain               PT Short Term Goals - 08/14/19 1426      PT SHORT TERM GOAL #1   Title  independent with initial HEP    Status  Achieved        PT Long Term Goals - 12/27/19 1532      PT LONG TERM GOAL #1   Title  report pain decreased 25%    Status  Partially Met      PT LONG TERM GOAL #2  Title  report that she can do her hair on her own    Status  Partially Met      PT LONG TERM GOAL #3   Title  increase right grip strength to 25#    Status  Partially Met      PT LONG TERM GOAL #4   Title  increase cervical ROM 25%    Status  Achieved            Plan - 12/27/19 1528    Clinical Impression Statement  Patient has had issues over the past period with stomach issues, even being admitted to the hospital for a few days, this hampered any progress and set her back some with the stress, she has really lost a lot of function in the hands and UE's.  Her legs are solid but having difficulty with core due to stomach pain and cramps.  Her diagnosis is one that will deteriorate fast if she is not able to stay active.  I really like how her legs are doing and how she is walking but really worry about her arms and how she can stay functioning    PT Frequency  2x / week    PT Duration  4 weeks    PT Treatment/Interventions  ADLs/Self Care Home Management;Cryotherapy;Electrical Stimulation;Moist Heat;Traction;Ultrasound;Therapeutic activities;Therapeutic exercise;Neuromuscular re-education;Manual techniques;Dry needling;Patient/family education;Taping    PT Next Visit  Plan  I continue to work on strength, function and treat the significant spasms and pain that she has due to the compenstaion    Consulted and Agree with Plan of Care  Patient       Patient will benefit from skilled therapeutic intervention in order to improve the following deficits and impairments:  Pain, Improper body mechanics, Impaired sensation, Increased muscle spasms, Postural dysfunction, Impaired tone, Impaired UE functional use, Decreased strength, Decreased range of motion, Decreased activity tolerance, Impaired flexibility  Visit Diagnosis: Muscle weakness (generalized)  Cervicalgia  Radiculopathy, cervical region     Problem List Patient Active Problem List   Diagnosis Date Noted  . Small bowel obstruction ([place]) 12/06/2019  . SBO (small bowel obstruction) ([place]) 12/06/2019  . Collagenous colitis 07/28/2018  . Decreased sensation of lower extremity 07/20/2018  . ALS (amyotrophic lateral sclerosis) ([place]) 12/23/2017  . Muscle weakness (generalized) 11/23/2017  . Bilateral arm weakness 11/11/2017  . Elevated blood pressure reading without diagnosis of hypertension 11/11/2017  . Post herpetic neuralgia 11/11/2017  . MDS (myelodysplastic syndrome) ([place]) 01/22/2015    Sumner Boast., PT 12/27/2019, 3:40 PM  [address] [place], [place], [address] Phone: [phone]   Fax:  [phone]  Name: HISAKO BUGH MRN: [id number] Date of Birth: 10-16-1961

## 2020-01-01 ENCOUNTER — Encounter: Payer: Medicare Other | Admitting: Physical Therapy

## 2020-01-03 ENCOUNTER — Encounter: Payer: Self-pay | Admitting: Physical Therapy

## 2020-01-03 ENCOUNTER — Other Ambulatory Visit: Payer: Self-pay

## 2020-01-03 ENCOUNTER — Ambulatory Visit: Payer: Medicare Other | Admitting: Physical Therapy

## 2020-01-03 DIAGNOSIS — M542 Cervicalgia: Secondary | ICD-10-CM | POA: Diagnosis not present

## 2020-01-03 DIAGNOSIS — M6281 Muscle weakness (generalized): Secondary | ICD-10-CM

## 2020-01-03 DIAGNOSIS — M5412 Radiculopathy, cervical region: Secondary | ICD-10-CM

## 2020-01-03 NOTE — Therapy (Signed)
[place], [place], [address] Phone: [phone]   Fax:  [phone] Progress Note Reporting Period 1/4/21to 01/03/20 for visits 21-30  See note below for Objective Data and Assessment of Progress/Goals.      Physical Therapy Treatment  Patient Details  Name: TAELYR JANTZ MRN: [id number] Date of Birth: 12/07/1960 Referring Provider (PT): Elsner   Encounter Date: 01/03/2020  PT End of Session - 01/03/20 1700    Visit Number  30    Date for PT Re-Evaluation  01/27/20    PT Start Time  1605    PT Stop Time  1655    PT Time Calculation (min)  50 min    Activity Tolerance  Patient limited by pain    Behavior During Therapy  [facility] for tasks assessed/performed       Past Medical History:  Diagnosis Date  . ALS (amyotrophic lateral sclerosis) ([place])   . Postherpetic neuralgia     Past Surgical History:  Procedure Laterality Date  . APPENDECTOMY    . PARTIAL HYSTERECTOMY      There were no vitals filed for this visit.  Subjective Assessment - 01/03/20 1656    Subjective  PAtient has continued to miss appointments due to other health issues.  She comes in with hands that are purple and cold    Currently in Pain?  Yes    Pain Score  5     Pain Location  Neck    Pain Orientation  Right;Left                       [facility] Adult PT Treatment/Exercise - 01/03/20 0001      Neck Exercises: Machines for Strengthening   Nustep  Level 2 x 5 minutes    Other Machines for Strengthening  leg extension 5# 2x 10, leg flexion 20#  2x 10, leg press 15# x 10    Other Machines for Strengthening  5# triceps      Manual Therapy   Manual Therapy  Soft tissue mobilization    Manual therapy comments  gentle stretches of the upper traps, some passive motions of the hands, finger and the elbows    Soft tissue mobilization  cervical area and the upper traps into the rhomboids, very tight and  tender and she asked to be easy due to pain               PT Short Term Goals - 08/14/19 1426      PT SHORT TERM GOAL #1   Title  independent with initial HEP    Status  Achieved        PT Long Term Goals - 01/03/20 1742      PT LONG TERM GOAL #1   Title  report pain decreased 25%    Status  Partially Met            Plan - 01/03/20 1701    Clinical Impression Statement  Patient with purple and cold fingers coming in, I focused some on the PROM and stretches of the fingers, hand, wrist and the shoulders , this really seemed to help the blood flow as her hands turned pink and they were warm after.    PT Next Visit Plan  I showed her some things to do with her hands, I really think she should try to use the hands and arms more to not lose the  motions but understand that she may have increased spasms and compensation of the traps    Consulted and Agree with Plan of Care  Patient       Patient will benefit from skilled therapeutic intervention in order to improve the following deficits and impairments:  Pain, Improper body mechanics, Impaired sensation, Increased muscle spasms, Postural dysfunction, Impaired tone, Impaired UE functional use, Decreased strength, Decreased range of motion, Decreased activity tolerance, Impaired flexibility  Visit Diagnosis: Muscle weakness (generalized)  Cervicalgia  Radiculopathy, cervical region     Problem List Patient Active Problem List   Diagnosis Date Noted  . Small bowel obstruction ([place]) 12/06/2019  . SBO (small bowel obstruction) ([place]) 12/06/2019  . Collagenous colitis 07/28/2018  . Decreased sensation of lower extremity 07/20/2018  . ALS (amyotrophic lateral sclerosis) ([place]) 12/23/2017  . Muscle weakness (generalized) 11/23/2017  . Bilateral arm weakness 11/11/2017  . Elevated blood pressure reading without diagnosis of hypertension 11/11/2017  . Post herpetic neuralgia 11/11/2017  . MDS (myelodysplastic syndrome)  ([place]) 01/22/2015    Sumner Boast., PT 01/03/2020, 5:43 PM  [address] [place], [place], [address] Phone: [phone]   Fax:  [phone]  Name: CARENA STREAM MRN: [id number] Date of Birth: June 16, 1961

## 2020-01-05 ENCOUNTER — Ambulatory Visit (INDEPENDENT_AMBULATORY_CARE_PROVIDER_SITE_OTHER): Payer: Medicare Other | Admitting: Psychiatry

## 2020-01-05 ENCOUNTER — Ambulatory Visit: Payer: Medicare Other | Admitting: Psychiatry

## 2020-01-05 DIAGNOSIS — F321 Major depressive disorder, single episode, moderate: Secondary | ICD-10-CM | POA: Diagnosis not present

## 2020-01-05 NOTE — Progress Notes (Signed)
[facility] Counselor/Therapist Progress Note  Patient ID: Lori Alvarez, MRN: [id number],    Date: 01/05/2020  Time Spent: 60 minutes   2:05pm to 3:05pm  Virtual Visit Note Connected with patient by a video enabled telemedicine/telehealth application or telephone, with their informed consent, and verified patient privacy and that I am speaking with the correct person using two identifiers. I discussed the limitations, risks, security and privacy concerns of performing psychotherapy and management service by telephone and the availability of in person appointments. I also discussed with the patient that there may be a patient responsible charge related to this service. The patient expressed understanding and agreed to proceed. I discussed the treatment planning with the patient. The patient was provided an opportunity to ask questions and all were answered. The patient agreed with the plan and demonstrated an understanding of the instructions. The patient was advised to call  our office if  symptoms worsen or feel they are in a crisis state and need immediate contact.   Therapist Location: [facility] Patient Location: home  Treatment Type: Individual Therapy  Reported Symptoms: anxiety, depression, sometimes feels overwhelmed with her illness and other health concerns,   Mental Status Exam:  Appearance:   n/a  telehealth     Behavior:  Appropriate and Sharing  Motor:  n/a  telehealth  Speech/Language:   Normal Rate  Affect:  n/a   telehealth  Mood:  anxious and depressed  Thought process:  normal  Thought content:    WNL  Sensory/Perceptual disturbances:    WNL  Orientation:  oriented to person, place, time/date, situation, day of week, month of year and year  Attention:  Good  Concentration:  Good  Memory:  WNL  Fund of knowledge:   Good  Insight:    Good  Judgment:   Good  Impulse Control:  Good   Risk Assessment: Danger to Self:  No Self-injurious  Behavior: No Danger to Others: No Duty to Warn:no Physical Aggression / Violence:No  Access to Firearms a concern: No  Gang Involvement:No   Subjective:  Patient concerned with her ALS but has also had some other health issues more recently.  Had some stomach issues in past couple of months, including being hospitalized.  Interventions: Cognitive Behavioral Therapy and Ego-Supportive  Diagnosis:   ICD-10-CM   1. Moderate major depression, single episode ([place])  F32.1      Plan: Patient is not signing tx plan on a computer screen due to [place].  Treatment Goals: Goals may remain here in tx plan as patient works on strategies to meet her goals. Some changes made in goals today to better meet patient needs.  Long term goal: Develop the ability to recognize, accept, and cope with feelings of depression.  Short term goal: Learn and implement calming skills to reduce overall tension and moments of increased depression and anxiety.  Strategies: 1) Identify cognitive self-talk that supports depression. Work to interrupt those self-talk messages, and eventually replace it with self-talk that patient feels is more supportive to her in managing her ALS symptoms and progression. 2) Encourage patient's open, full expression of her depression and anxiety as she navigates her journey with advancing ALS.  PROGRESS: Patient today reporting anxiety and depression.  No SI. Shares today about her recent unplanned hospitalization for stomach complications.  Things resolved without having to have any surgery nor procedures, for which she was grateful.  Talked through anxious and depressive thoughts mostly related to her progressing health  issues due to being ALS patient.  Some fears for the future also processed and is looking into potential assistance at home with personal needs of bathing etc.   Is still attending PT and that is helping her maintain some of her current strength. Husband remains  supportive however he has some health issues himself. Has a really good friend that has consistently been helpful to her.  States that "prayer is what helps her cope the most" and her faith remains strong.   Our sessions address her emotional needs including the expression of her sadness, anxiety, depression, fears but also includes noting the Positives in her life and living life to the fullest as much as she is able to do with ALS.  Goal review and progress noted with patient.   Goal review and progress noted.  Next appt within 1 month   Shanon Ace, LCSW

## 2020-01-08 ENCOUNTER — Ambulatory Visit: Payer: Medicare Other | Admitting: Physical Therapy

## 2020-01-08 ENCOUNTER — Other Ambulatory Visit: Payer: Self-pay

## 2020-01-08 ENCOUNTER — Encounter: Payer: Self-pay | Admitting: Physical Therapy

## 2020-01-08 DIAGNOSIS — M542 Cervicalgia: Secondary | ICD-10-CM | POA: Diagnosis not present

## 2020-01-08 DIAGNOSIS — M5412 Radiculopathy, cervical region: Secondary | ICD-10-CM

## 2020-01-08 DIAGNOSIS — M6281 Muscle weakness (generalized): Secondary | ICD-10-CM

## 2020-01-08 NOTE — Therapy (Signed)
[place] [place], [place], [address] Phone: [phone]   Fax:  [phone]  Physical Therapy Treatment  Patient Details  Name: Lori Alvarez MRN: [id number] Date of Birth: [birth year] Referring Provider (PT): Elsner   Encounter Date: 01/08/2020  PT End of Session - 01/08/20 1523    Visit Number  31    Date for PT Re-Evaluation  01/27/20    PT Start Time  1435    PT Stop Time  1525    PT Time Calculation (min)  50 min    Activity Tolerance  Patient tolerated treatment well    Behavior During Therapy  [facility] for tasks assessed/performed       Past Medical History:  Diagnosis Date  . ALS (amyotrophic lateral sclerosis) ([place])   . Postherpetic neuralgia     Past Surgical History:  Procedure Laterality Date  . APPENDECTOMY    . PARTIAL HYSTERECTOMY      There were no vitals filed for this visit.  Subjective Assessment - 01/08/20 1520    Subjective  Patient reports that today she is feeling really pretty good, "I have not felt this good in a long time"    Currently in Pain?  Yes    Pain Score  2     Pain Location  Neck    Pain Orientation  Right;Left                       [facility] Adult PT Treatment/Exercise - 01/08/20 0001      Neck Exercises: Machines for Strengthening   Nustep  Level 2 x 5 minutes    Other Machines for Strengthening  leg extension 5# 2x 10, leg flexion 20#  2x 10, leg press 15# x 10    Other Machines for Strengthening  5# triceps      Neck Exercises: Seated   Other Seated Exercise  use of the computer table on wheels having her use the arm to push and pull trying to limit trunk and hip motions, 1# wrist flexiona nd extension, finger abduction and adduction      Manual Therapy   Manual Therapy  Soft tissue mobilization    Manual therapy comments  gentle stretches of the upper traps, some passive motions of the hands, finger and the elbows approximation with  motions of the fingers trying to get some increased movements and use, PROM of the shoulders    Soft tissue mobilization  cervical area and the upper traps into the rhomboids, very tight and tender and she asked to be easy due to pain               PT Short Term Goals - 08/14/19 1426      PT SHORT TERM GOAL #1   Title  independent with initial HEP    Status  Achieved        PT Long Term Goals - 01/03/20 1742      PT LONG TERM GOAL #1   Title  report pain decreased 25%    Status  Partially Met            Plan - 01/08/20 1524    Clinical Impression Statement  Patient really feeling better today, her hands and UE's are very weak, I feel that she has lost some use/function of them over the past few months with the stomach and other health issues and her not being able to  attend regularly, I feel that she comes in and the hands are purlple and cold but after we move them and get them working they are pink and warm    PT Next Visit Plan  I showed her some things to do with her hands, I really think she should try to use the hands and arms more to not lose the motions but understand that she may have increased spasms and compensation of the traps    Consulted and Agree with Plan of Care  Patient       Patient will benefit from skilled therapeutic intervention in order to improve the following deficits and impairments:  Pain, Improper body mechanics, Impaired sensation, Increased muscle spasms, Postural dysfunction, Impaired tone, Impaired UE functional use, Decreased strength, Decreased range of motion, Decreased activity tolerance, Impaired flexibility  Visit Diagnosis: Muscle weakness (generalized)  Cervicalgia  Radiculopathy, cervical region     Problem List Patient Active Problem List   Diagnosis Date Noted  . Small bowel obstruction ([place]) 12/06/2019  . SBO (small bowel obstruction) ([place]) 12/06/2019  . Collagenous colitis 07/28/2018  . Decreased sensation of  lower extremity 07/20/2018  . ALS (amyotrophic lateral sclerosis) ([place]) 12/23/2017  . Muscle weakness (generalized) 11/23/2017  . Bilateral arm weakness 11/11/2017  . Elevated blood pressure reading without diagnosis of hypertension 11/11/2017  . Post herpetic neuralgia 11/11/2017  . MDS (myelodysplastic syndrome) ([place]) 01/22/2015    Sumner Boast., PT 01/08/2020, 3:26 PM  [place] [address] [place], [place], [address] Phone: [phone]   Fax:  [phone]  Name: YARELLI DECELLES MRN: [id number] Date of Birth: Aug 16, 1961

## 2020-01-10 ENCOUNTER — Ambulatory Visit: Payer: Medicare Other | Admitting: Physical Therapy

## 2020-01-10 ENCOUNTER — Encounter: Payer: Self-pay | Admitting: Physical Therapy

## 2020-01-10 ENCOUNTER — Other Ambulatory Visit: Payer: Self-pay

## 2020-01-10 DIAGNOSIS — M6281 Muscle weakness (generalized): Secondary | ICD-10-CM | POA: Diagnosis not present

## 2020-01-10 DIAGNOSIS — M542 Cervicalgia: Secondary | ICD-10-CM

## 2020-01-10 DIAGNOSIS — M5412 Radiculopathy, cervical region: Secondary | ICD-10-CM | POA: Diagnosis not present

## 2020-01-10 NOTE — Therapy (Signed)
[place], [place], [address] Phone: [phone]   Fax:  [phone]  Physical Therapy Treatment  Patient Details  Name: Lori Alvarez MRN: [id number] Date of Birth: [birth year] Referring Provider (PT): Elsner   Encounter Date: 01/10/2020  PT End of Session - 01/10/20 1615    Visit Number  32    Date for PT Re-Evaluation  01/27/20    PT Start Time  1525    PT Stop Time  1615    PT Time Calculation (min)  50 min    Activity Tolerance  Patient tolerated treatment well    Behavior During Therapy  [facility] for tasks assessed/performed       Past Medical History:  Diagnosis Date  . ALS (amyotrophic lateral sclerosis) ([place])   . Postherpetic neuralgia     Past Surgical History:  Procedure Laterality Date  . APPENDECTOMY    . PARTIAL HYSTERECTOMY      There were no vitals filed for this visit.  Subjective Assessment - 01/10/20 1529    Subjective  Reports that she is still feeling good, tried some of the hand and wrist exerciss on her own at home    Currently in Pain?  Yes    Pain Score  2     Pain Location  Neck    Pain Orientation  Right;Left    Aggravating Factors   using the arms causes shoulder elevation                       [facility] Adult PT Treatment/Exercise - 01/10/20 0001      Neck Exercises: Machines for Strengthening   Nustep  Level 2 x 5 minutes    Other Machines for Strengthening  leg extension 5# 2x 10, leg flexion 20#  2x 10, leg press 15# x 10    Other Machines for Strengthening  5# triceps      Neck Exercises: Seated   Other Seated Exercise  use of the computer table on wheels having her use the arm to push and pull trying to limit trunk and hip motions, 1# wrist flexiona nd extension, finger abduction and adduction added radial deviation      Neck Exercises: Supine   Other Supine Exercise  feet on ball K2C, trunk rotaiton, small bridges and isometric  abs.      Manual Therapy   Manual Therapy  Soft tissue mobilization    Manual therapy comments  gentle stretches of the upper traps, some passive motions of the hands, finger and the elbows approximation with motions of the fingers trying to get some increased movements and use, PROM of the shoulders    Soft tissue mobilization  cervical area and the upper traps into the rhomboids, very tight and tender and she asked to be easy due to pain               PT Short Term Goals - 08/14/19 1426      PT SHORT TERM GOAL #1   Title  independent with initial HEP    Status  Achieved        PT Long Term Goals - 01/10/20 1618      PT LONG TERM GOAL #1   Title  report pain decreased 25%    Status  Partially Met      PT LONG TERM GOAL #2   Title  report that she can do her hair  on her own    Status  Partially Met      PT LONG TERM GOAL #3   Title  increase right grip strength to 25#    Status  Partially Met      PT LONG TERM GOAL #4   Title  increase cervical ROM 25%    Status  Achieved      PT LONG TERM GOAL #5   Title  patient will be able to manipulate controls ont he stair lift    Time  4    Period  Weeks    Status  New            Plan - 01/10/20 1616    Clinical Impression Statement  Patient is very tight in the anterior shoulders and chest wall, she tolerated some stretches but this was painful, PROM of shoulder flexion also causes pain underneath the shoulder due to tightness here as well, she did well with adding radial deviation.    PT Next Visit Plan  may try to add some scapular strength if we can decrease shoulder elevation with this exercise    Consulted and Agree with Plan of Care  Patient       Patient will benefit from skilled therapeutic intervention in order to improve the following deficits and impairments:  Pain, Improper body mechanics, Impaired sensation, Increased muscle spasms, Postural dysfunction, Impaired tone, Impaired UE functional use,  Decreased strength, Decreased range of motion, Decreased activity tolerance, Impaired flexibility  Visit Diagnosis: Muscle weakness (generalized)  Cervicalgia  Radiculopathy, cervical region     Problem List Patient Active Problem List   Diagnosis Date Noted  . Small bowel obstruction ([place]) 12/06/2019  . SBO (small bowel obstruction) ([place]) 12/06/2019  . Collagenous colitis 07/28/2018  . Decreased sensation of lower extremity 07/20/2018  . ALS (amyotrophic lateral sclerosis) ([place]) 12/23/2017  . Muscle weakness (generalized) 11/23/2017  . Bilateral arm weakness 11/11/2017  . Elevated blood pressure reading without diagnosis of hypertension 11/11/2017  . Post herpetic neuralgia 11/11/2017  . MDS (myelodysplastic syndrome) ([place]) 01/22/2015    Sumner Boast., PT 01/10/2020, 4:20 PM  [place] [address] [place], [place], [address] Phone: [phone]   Fax:  [phone]  Name: ASTELLA DESIR MRN: [id number] Date of Birth: 08-05-1961

## 2020-01-12 DIAGNOSIS — F5101 Primary insomnia: Secondary | ICD-10-CM | POA: Diagnosis not present

## 2020-01-12 DIAGNOSIS — Z8719 Personal history of other diseases of the digestive system: Secondary | ICD-10-CM | POA: Diagnosis not present

## 2020-01-12 DIAGNOSIS — G1221 Amyotrophic lateral sclerosis: Secondary | ICD-10-CM | POA: Diagnosis not present

## 2020-01-12 DIAGNOSIS — K52831 Collagenous colitis: Secondary | ICD-10-CM | POA: Diagnosis not present

## 2020-01-12 DIAGNOSIS — E7800 Pure hypercholesterolemia, unspecified: Secondary | ICD-10-CM | POA: Diagnosis not present

## 2020-01-12 DIAGNOSIS — F419 Anxiety disorder, unspecified: Secondary | ICD-10-CM | POA: Diagnosis not present

## 2020-01-12 DIAGNOSIS — R8290 Unspecified abnormal findings in urine: Secondary | ICD-10-CM | POA: Diagnosis not present

## 2020-01-15 ENCOUNTER — Encounter: Payer: Self-pay | Admitting: Physical Therapy

## 2020-01-15 ENCOUNTER — Ambulatory Visit: Payer: Medicare Other | Admitting: Physical Therapy

## 2020-01-15 ENCOUNTER — Other Ambulatory Visit: Payer: Self-pay

## 2020-01-15 DIAGNOSIS — M5412 Radiculopathy, cervical region: Secondary | ICD-10-CM

## 2020-01-15 DIAGNOSIS — M542 Cervicalgia: Secondary | ICD-10-CM | POA: Diagnosis not present

## 2020-01-15 DIAGNOSIS — M6281 Muscle weakness (generalized): Secondary | ICD-10-CM | POA: Diagnosis not present

## 2020-01-15 NOTE — Therapy (Signed)
[place] [address] [place], [place], [address] Phone: [phone]   Fax:  [phone]  Physical Therapy Treatment  Patient Details  Name: Lori Alvarez MRN: [id number] Date of Birth: [birth year] Referring Provider (PT): Elsner   Encounter Date: 01/15/2020  PT End of Session - 01/15/20 1537    Visit Number  33    Date for PT Re-Evaluation  01/27/20    PT Start Time  1435    PT Stop Time  1533    PT Time Calculation (min)  58 min    Activity Tolerance  Patient tolerated treatment well    Behavior During Therapy  [facility] for tasks assessed/performed       Past Medical History:  Diagnosis Date  . ALS (amyotrophic lateral sclerosis) ([place])   . Postherpetic neuralgia     Past Surgical History:  Procedure Laterality Date  . APPENDECTOMY    . PARTIAL HYSTERECTOMY      There were no vitals filed for this visit.  Subjective Assessment - 01/15/20 1503    Subjective  Patient reports that she had two falls since I saw her last, one while she squatted down to pick up somehting and the other while outside and bent to pick up and piece of paper, both times she reports falling backwards    Currently in Pain?  Yes    Pain Score  8     Pain Location  Neck    Aggravating Factors   "the falls did a number on me"                       [facility] Adult PT Treatment/Exercise - 01/15/20 0001      High Level Balance   High Level Balance Activities  Negotitating around obstacles;Negotiating over obstacles    High Level Balance Comments  picking up items (cones from the floor), on airex head turns, eyes closed, ball kicks in a corner      Manual Therapy   Manual Therapy  Soft tissue mobilization    Manual therapy comments  gentle stretches of the upper traps, some passive motions of the hands, finger and the elbows approximation with motions of the fingers trying to get some increased movements and use, PROM of the  shoulders    Soft tissue mobilization  cervical area and the upper traps into the rhomboids, very tight and tender and she asked to be easy due to pain               PT Short Term Goals - 08/14/19 1426      PT SHORT TERM GOAL #1   Title  independent with initial HEP    Status  Achieved        PT Long Term Goals - 01/10/20 1618      PT LONG TERM GOAL #1   Title  report pain decreased 25%    Status  Partially Met      PT LONG TERM GOAL #2   Title  report that she can do her hair on her own    Status  Partially Met      PT LONG TERM GOAL #3   Title  increase right grip strength to 25#    Status  Partially Met      PT LONG TERM GOAL #4   Title  increase cervical ROM 25%    Status  Achieved  PT LONG TERM GOAL #5   Title  patient will be able to manipulate controls ont he stair lift    Time  4    Period  Weeks    Status  New            Plan - 01/15/20 1538    Clinical Impression Statement  Patient reports two falls over the weekend, reports that she was bending over or squatting.  She reports that she is in more pain from falling and bracing herself.  She has more spasms and tenderness, I tried some increased balance stuff and she is off balance    PT Next Visit Plan  work on balance    Consulted and Agree with Plan of Care  Patient       Patient will benefit from skilled therapeutic intervention in order to improve the following deficits and impairments:  Pain, Improper body mechanics, Impaired sensation, Increased muscle spasms, Postural dysfunction, Impaired tone, Impaired UE functional use, Decreased strength, Decreased range of motion, Decreased activity tolerance, Impaired flexibility  Visit Diagnosis: Muscle weakness (generalized)  Cervicalgia  Radiculopathy, cervical region     Problem List Patient Active Problem List   Diagnosis Date Noted  . Small bowel obstruction ([place]) 12/06/2019  . SBO (small bowel obstruction) ([place]) 12/06/2019   . Collagenous colitis 07/28/2018  . Decreased sensation of lower extremity 07/20/2018  . ALS (amyotrophic lateral sclerosis) ([place]) 12/23/2017  . Muscle weakness (generalized) 11/23/2017  . Bilateral arm weakness 11/11/2017  . Elevated blood pressure reading without diagnosis of hypertension 11/11/2017  . Post herpetic neuralgia 11/11/2017  . MDS (myelodysplastic syndrome) ([place]) 01/22/2015    Sumner Boast., PT 01/15/2020, 4:17 PM  [address] [place], [place], [address] Phone: [phone]   Fax:  [phone]  Name: Lori Alvarez MRN: [id number] Date of Birth: [birth year]

## 2020-01-16 ENCOUNTER — Telehealth: Payer: Self-pay | Admitting: *Deleted

## 2020-01-16 NOTE — Telephone Encounter (Signed)
Received a Palliative Care referral from Dr. Kenton Kingfisher. Called patient and left a voicemail to schedule a home visit. Left contact information for return call.

## 2020-01-17 ENCOUNTER — Telehealth: Payer: Self-pay | Admitting: *Deleted

## 2020-01-17 ENCOUNTER — Encounter: Payer: Self-pay | Admitting: Physical Therapy

## 2020-01-17 ENCOUNTER — Other Ambulatory Visit: Payer: Self-pay

## 2020-01-17 ENCOUNTER — Ambulatory Visit: Payer: Medicare Other | Admitting: Physical Therapy

## 2020-01-17 DIAGNOSIS — M6281 Muscle weakness (generalized): Secondary | ICD-10-CM | POA: Diagnosis not present

## 2020-01-17 DIAGNOSIS — M5412 Radiculopathy, cervical region: Secondary | ICD-10-CM

## 2020-01-17 DIAGNOSIS — M542 Cervicalgia: Secondary | ICD-10-CM | POA: Diagnosis not present

## 2020-01-17 NOTE — Telephone Encounter (Signed)
Received a return call from patient to schedule an initial Palliative care home visit. Visit scheduled for 01/22/20 at 2:00 pm.

## 2020-01-17 NOTE — Therapy (Signed)
[place] [address] [place], [place], [address] Phone: [phone]   Fax:  [phone]  Physical Therapy Treatment  Patient Details  Name: Lori Alvarez MRN: [id number] Date of Birth: 24-Jan-1961 Referring Provider (PT): Elsner   Encounter Date: 01/17/2020  PT End of Session - 01/17/20 2119    Visit Number  34    Date for PT Re-Evaluation  01/27/20    PT Start Time  4174    PT Stop Time  1613    PT Time Calculation (min)  49 min    Activity Tolerance  Patient tolerated treatment well    Behavior During Therapy  [facility] for tasks assessed/performed       Past Medical History:  Diagnosis Date  . ALS (amyotrophic lateral sclerosis) ([place])   . Postherpetic neuralgia     Past Surgical History:  Procedure Laterality Date  . APPENDECTOMY    . PARTIAL HYSTERECTOMY      There were no vitals filed for this visit.  Subjective Assessment - 01/17/20 1546    Subjective  I think I need to work on the balance again    Currently in Pain?  Yes    Pain Score  5     Pain Location  Neck    Pain Orientation  Right;Left                       [facility] Adult PT Treatment/Exercise - 01/17/20 0001      High Level Balance   High Level Balance Activities  Negotitating around obstacles;Negotiating over obstacles    High Level Balance Comments  picking up items (cones from the floor), on airex head turns, eyes closed, ball kicks in a corner      Neck Exercises: Machines for Strengthening   Nustep  Level 2 x 4 minutes no arms      Manual Therapy   Manual Therapy  Soft tissue mobilization    Manual therapy comments  gentle stretches of the upper traps, some passive motions of the hands, finger and the elbows approximation with motions of the fingers trying to get some increased movements and use, PROM of the shoulders    Soft tissue mobilization  cervical area and the upper traps into the rhomboids, very tight and  tender and she asked to be easy due to pain               PT Short Term Goals - 08/14/19 1426      PT SHORT TERM GOAL #1   Title  independent with initial HEP    Status  Achieved        PT Long Term Goals - 01/17/20 1615      PT LONG TERM GOAL #1   Title  report pain decreased 25%    Status  Partially Met      PT LONG TERM GOAL #2   Title  report that she can do her hair on her own    Status  Not Met      PT LONG TERM GOAL #3   Title  increase right grip strength to 25#    Status  Not Met      PT LONG TERM GOAL #4   Title  increase cervical ROM 25%    Status  Achieved      PT LONG TERM GOAL #5   Title  patient will be able to manipulate controls ont  he stair lift    Status  Partially Met            Plan - 01/17/20 1614    Clinical Impression Statement  Patient really comes in with purple hands that are very cold, I do some manual and passive stretches and approximation with motions of the fingers and the color comes back to normal and they warm up, she is now afraid with her walking but with the balance activities that we started she does well, some issues with the dynamic surfaces    PT Next Visit Plan  work on balance    Consulted and Agree with Plan of Care  Patient       Patient will benefit from skilled therapeutic intervention in order to improve the following deficits and impairments:  Pain, Improper body mechanics, Impaired sensation, Increased muscle spasms, Postural dysfunction, Impaired tone, Impaired UE functional use, Decreased strength, Decreased range of motion, Decreased activity tolerance, Impaired flexibility  Visit Diagnosis: Muscle weakness (generalized)  Cervicalgia  Radiculopathy, cervical region     Problem List Patient Active Problem List   Diagnosis Date Noted  . Small bowel obstruction ([place]) 12/06/2019  . SBO (small bowel obstruction) ([place]) 12/06/2019  . Collagenous colitis 07/28/2018  . Decreased sensation of lower  extremity 07/20/2018  . ALS (amyotrophic lateral sclerosis) ([place]) 12/23/2017  . Muscle weakness (generalized) 11/23/2017  . Bilateral arm weakness 11/11/2017  . Elevated blood pressure reading without diagnosis of hypertension 11/11/2017  . Post herpetic neuralgia 11/11/2017  . MDS (myelodysplastic syndrome) ([place]) 01/22/2015    Sumner Boast., PT 01/17/2020, 4:16 PM  [address] [place], [place], [address] Phone: [phone]   Fax:  [phone]  Name: Lori Alvarez MRN: [id number] Date of Birth: 11-May-1961

## 2020-01-18 ENCOUNTER — Ambulatory Visit
Admission: RE | Admit: 2020-01-18 | Discharge: 2020-01-18 | Disposition: A | Discharge status: Home / Self Care | Payer: Medicare Other | Source: Ambulatory Visit | Attending: Gastroenterology | Admitting: Gastroenterology

## 2020-01-18 ENCOUNTER — Other Ambulatory Visit: Payer: Self-pay | Admitting: Gastroenterology

## 2020-01-18 DIAGNOSIS — R1010 Upper abdominal pain, unspecified: Secondary | ICD-10-CM

## 2020-01-18 DIAGNOSIS — K5900 Constipation, unspecified: Secondary | ICD-10-CM | POA: Diagnosis not present

## 2020-01-22 ENCOUNTER — Other Ambulatory Visit: Payer: Medicare Other

## 2020-01-22 ENCOUNTER — Other Ambulatory Visit: Payer: Self-pay

## 2020-01-22 ENCOUNTER — Ambulatory Visit: Payer: Medicare Other | Attending: Neurological Surgery | Admitting: Physical Therapy

## 2020-01-22 ENCOUNTER — Encounter: Payer: Self-pay | Admitting: Physical Therapy

## 2020-01-22 ENCOUNTER — Other Ambulatory Visit: Payer: Medicare Other | Admitting: *Deleted

## 2020-01-22 DIAGNOSIS — M5412 Radiculopathy, cervical region: Secondary | ICD-10-CM | POA: Diagnosis not present

## 2020-01-22 DIAGNOSIS — M542 Cervicalgia: Secondary | ICD-10-CM | POA: Insufficient documentation

## 2020-01-22 DIAGNOSIS — M6281 Muscle weakness (generalized): Secondary | ICD-10-CM | POA: Diagnosis not present

## 2020-01-22 DIAGNOSIS — Z515 Encounter for palliative care: Secondary | ICD-10-CM

## 2020-01-22 NOTE — Therapy (Signed)
[place] [address] [place], [place], [address] Phone: [phone]   Fax:  [phone]  Physical Therapy Treatment  Patient Details  Name: Lori Alvarez MRN: [id number] Date of Birth: [birth year] Referring Provider (PT): Elsner   Encounter Date: 01/22/2020  PT End of Session - 01/22/20 6314    Visit Number  35    Date for PT Re-Evaluation  01/27/20    PT Start Time  1700    PT Stop Time  1750    PT Time Calculation (min)  50 min    Activity Tolerance  Patient tolerated treatment well       Past Medical History:  Diagnosis Date  . ALS (amyotrophic lateral sclerosis) ([place])   . Postherpetic neuralgia     Past Surgical History:  Procedure Laterality Date  . APPENDECTOMY    . PARTIAL HYSTERECTOMY      There were no vitals filed for this visit.  Subjective Assessment - 01/22/20 1750    Subjective  Patient reports tha tover the weekend she had a little more difficulty getting up from the toilet, "legs felt weak"    Currently in Pain?  Yes    Pain Score  3     Pain Location  Neck                       [facility] Adult PT Treatment/Exercise - 01/22/20 0001      Neck Exercises: Machines for Strengthening   Nustep  Level 2 x 7 minutes no arms    Other Machines for Strengthening  leg extension 5# 2x 10, leg flexion 20#  2x 10, leg press 15# x 10    Other Machines for Strengthening  leg press 20# x10, then no weight single legs x 10 each with sets of 5      Neck Exercises: Seated   Other Seated Exercise  ball b/n knees squeeze    Other Seated Exercise  sit to stand without arms from 19" and from 20" 3 sets 5 reps      Manual Therapy   Manual Therapy  Soft tissue mobilization    Manual therapy comments  gentle stretches of the upper traps, some passive motions of the hands, finger and the elbows approximation with motions of the fingers trying to get some increased movements and use, PROM of the  shoulders, had some pain in the triceps and the lat with the end range flexion    Soft tissue mobilization  cervical area and the upper traps into the rhomboids, very tight and tender and she asked to be easy due to pain               PT Short Term Goals - 08/14/19 1426      PT SHORT TERM GOAL #1   Title  independent with initial HEP    Status  Achieved        PT Long Term Goals - 01/17/20 1615      PT LONG TERM GOAL #1   Title  report pain decreased 25%    Status  Partially Met      PT LONG TERM GOAL #2   Title  report that she can do her hair on her own    Status  Not Met      PT LONG TERM GOAL #3   Title  increase right grip strength to 25#    Status  Not  Met      PT LONG TERM GOAL #4   Title  increase cervical ROM 25%    Status  Achieved      PT LONG TERM GOAL #5   Title  patient will be able to manipulate controls ont he stair lift    Status  Partially Met            Plan - 01/22/20 1753    Clinical Impression Statement  Patient reported feeling weak in the legs this weekend with difficulty getting off the toilet, we worked on this from various heights 19"a nd 20", she did the first one very good but then as she went she did require assist to stand.  She did have some spasms and tenderness in the neck and the rhomboids today    PT Next Visit Plan  continue to work on blance and leg strength as ALS issues continue to arise    Consulted and Agree with Plan of Care  Patient       Patient will benefit from skilled therapeutic intervention in order to improve the following deficits and impairments:  Pain, Improper body mechanics, Impaired sensation, Increased muscle spasms, Postural dysfunction, Impaired tone, Impaired UE functional use, Decreased strength, Decreased range of motion, Decreased activity tolerance, Impaired flexibility  Visit Diagnosis: Muscle weakness (generalized)  Cervicalgia  Radiculopathy, cervical region     Problem  List Patient Active Problem List   Diagnosis Date Noted  . Small bowel obstruction ([place]) 12/06/2019  . SBO (small bowel obstruction) ([place]) 12/06/2019  . Collagenous colitis 07/28/2018  . Decreased sensation of lower extremity 07/20/2018  . ALS (amyotrophic lateral sclerosis) ([place]) 12/23/2017  . Muscle weakness (generalized) 11/23/2017  . Bilateral arm weakness 11/11/2017  . Elevated blood pressure reading without diagnosis of hypertension 11/11/2017  . Post herpetic neuralgia 11/11/2017  . MDS (myelodysplastic syndrome) ([place]) 01/22/2015    Sumner Boast., PT 01/22/2020, 5:55 PM  [address] [place], [place], [address] Phone: [phone]   Fax:  [phone]  Name: CITLALI GAUTNEY MRN: [id number] Date of Birth: [birth year]

## 2020-01-23 NOTE — Progress Notes (Signed)
COMMUNITY PALLIATIVE CARE RN NOTE  PATIENT NAME: Lori Alvarez DOB: [birth year] MRN: [id number]  PRIMARY CARE PROVIDER: Shirline Frees, MD  RESPONSIBLE PARTY:  Acct ID - Guarantor Home Phone Work Phone Relationship Acct Type  [id number] Erline [id number]  Self P/F     [address], [place], [place] [address]   Covid-19 Pre-screening Negative  PLAN OF CARE and INTERVENTION:  1. ADVANCE CARE PLANNING/GOALS OF CARE: Goal for patient is to maintain her current strength and ability to walk. She has a DNR.  2. PATIENT/CAREGIVER EDUCATION: Explained Palliative care services, Safe Mobility/transfers, Fall Prevention 3. DISEASE STATUS: Joint visit made with Palliative Care SW, Monica Lonon. Met with patient and her husband, Marlou Sa, in their home. Patient was able to provide her health history. She reports being officially diagnosed with ALS in March 2019. She has lost the use of her arms, which she says had made things difficult. She has no feeling in her left arm or hand. She says that she tried an infusion, Radicava, for her ALS but due to continual daily infusions, this became too much for her. She also states that after 3 days of receiving this infusion she started with persistent diarrhea and it is very expensive. With these 3 issues, she decided to discontinue this medication. She is experiencing pain in her neck, arms and back. She is taking Lyrica and Baclofen 4x/day which is controlling pain well. She is currently receiving outpatient PT twice weekly. She is really pleased with her progress. She is still ambulatory but has had some falls and difficulty getting up from these falls. No apparent injuries. She has to rely on her husband for all personal care needs d/t inability to use her arms. They have installed a chair lift on the stairs for use when needed. She is currently able to walk up the stairs, but she does this at a slow pace. She reports that she has been losing weight. Her  intake is fair. She has no difficulties swallowing and takes her medications without difficulty. She is continent of both bowel and bladder. She has been having issues with intermittent nausea/vomiting, abdominal cramping and diarrhea. She feels that it is controlled at this time, but these symptoms do occur in cycles.  She feels that once she gets one issue under control, another issue starts. She has spoke with a home care agency, [facility], regarding personal care assistance. She has the paperwork, but is hesitant about starting services. Her husband is having some back issues which is limiting his ability somewhat to care for his wife. She has a friend that also helps whenever she can. She has someone to deep clean her home monthly. She has a very strong faith. She also speaks with a counselor through [employer] center monthly. She is agreeable to future Palliative care visits. Will continue to monitor.    HISTORY OF PRESENT ILLNESS:  This is a 59 yo female with a diagnosis of ALS. She lives at home with her husband. Palliative care team was asked to follow patient for additional support. Will visit patient monthly and PRN.  CODE STATUS: DNR  ADVANCED DIRECTIVES: Y MOST FORM: no (form left in the home for patient and husband to review) PPS: 40%   PHYSICAL EXAM:   VITALS: Today's Vitals   01/22/20 1502  BP: 112/87  Pulse: 95  Resp: 18  Temp: 98 F (36.7 C)  TempSrc: Temporal  SpO2: 99%    LUNGS: clear to auscultation  CARDIAC:  Cor RRR EXTREMITIES: No edema SKIN: Skin color, texture, turgor normal. No rashes or lesions  NEURO: Alert and oriented x 3, generalized weakness, frequent falls, ambulatory   Duration of visit and documentation 120 minutes)   Daryl Eastern, RN BSN

## 2020-01-24 ENCOUNTER — Encounter: Payer: Medicare Other | Admitting: Physical Therapy

## 2020-01-24 NOTE — Progress Notes (Signed)
COMMUNITY PALLIATIVE CARE SW NOTE  PATIENT NAME: Lori Alvarez DOB: 08/19/1961 MRN: [id number]  PRIMARY CARE PROVIDER: Shirline Frees, MD  RESPONSIBLE PARTY:  Acct ID - Guarantor Home Phone Work Phone Relationship Acct Type  [id number] Erline [phone]  Self P/F     [address], [place], [place] [address]     PLAN OF CARE and INTERVENTIONS:             1. GOALS OF CARE/ ADVANCE CARE PLANNING: Patient is a DNR. MOST Form was presented and education was provided. Patient and her husband to review form further for completion.  2. SOCIAL/EMOTIONAL/SPIRITUAL ASSESSMENT/ INTERVENTIONS: Palliative Care SW and RN-Monishia Nadara Mustard met with patient and her husband-Lori Alvarez in their home. Introduction to the team and palliative care program provided. Patient provided social and medical history on herself. She also expressed the difficulty of her decline, and how she is using her faith to cope with it. Her husband also shared at length, the difficulty of being a full-time caregiver with his own health issues. Patient states that she misses being able to do things with her hands such as bath and eat. Her husband provide all of her ADL's and occasionally has to feed her. She has bouts with diarrhea and vomiting. She has intermittent pain to her neck, shoulders and arms. She has had falls previously because she will loose her balance. She receives physical therapy 2x/week. She has stair lift that assist her with getting her upstairs. Patient has supportive friend who will bring her meals, help with house chores and provide emotional support. Patient see her neurologist every 3 months at the [facility]. Although patient understands her condition is terminal, she reports that her faith does not allow her to give up, however she stated that she does not want her life to be prolonged as she has no quality of life. Patient is a retired Psychologist, educational. She has been married to  her husband-Lori Alvarez for 30 years.. They have no children together. Patient's husband has a daughter from a previous relationship. Patient is a member of [employer] She is also receiving counseling through [employer] 1x month. SW provided introductions to palliative care services, social worker role, discussed goals, assessed needs and coping, emotional support, reassurance of support.  3. PATIENT/CAREGIVER EDUCATION/ COPING: Patient is alert and oriented x3. She seemed to be open and articulate about her faith,  coping, needs and efforts made to meet her needs. Patient enjoys watching TV. Patient shared her disappointment that she was does not have the support of her sister and became teary-eyed about this situation. SW processed and validated her feelings.  4. PERSONAL EMERGENCY PLAN: Patient and her husband verbalize understanding of visit palliative care visit frequency and support. 911 can be activated for emergenices 5. COMMUNITY RESOURCES COORDINATION/ HEALTH CARE NAVIGATION: Patient is receiving physical therapy. She goes to appointments at the [facility] every three months.She receives counseling at [employer] counseling agency. Patient has hired a Secretary/administrator to help with chores 1x month. They have contacted [facility] to assist with ADL's. Patient has a strong faith that sustains her.  6. FINANCIAL/LEGAL CONCERNS/INTERVENTIONS: Patient has private insurance and medicare disability. No financial or legal concerns at this time.      SOCIAL HX:  Social History   Tobacco Use  . Smoking status: Never Smoker  . Smokeless tobacco: Never Used  Substance Use Topics  . Alcohol use: No    CODE STATUS:  Code Status: Prior  ADVANCED DIRECTIVES: N MOST FORM COMPLETE No HOSPICE EDUCATION PROVIDED: Yes. Education provided on the differences between the palliative and hospice program. Patient had previous hospice experience with her father.   PPS: Patient ambulates  independently. Due to the progression of her ALS, she does not have use of her arms and hands which causes balances issues and she is at risk for falls.  I spent 60 minutes with patient and her husband, providing supportive presence and counseling, education regarding the palliative care program and social work role, discussing care goals, resources, normalizing feelings, assessing coping and needs.      [address] [place], [place]

## 2020-01-26 ENCOUNTER — Ambulatory Visit: Payer: Medicare Other | Admitting: Psychiatry

## 2020-01-26 DIAGNOSIS — R197 Diarrhea, unspecified: Secondary | ICD-10-CM | POA: Diagnosis not present

## 2020-01-29 ENCOUNTER — Ambulatory Visit: Payer: Medicare Other | Admitting: Physical Therapy

## 2020-01-29 ENCOUNTER — Encounter: Payer: Self-pay | Admitting: Physical Therapy

## 2020-01-29 ENCOUNTER — Other Ambulatory Visit: Payer: Self-pay

## 2020-01-29 DIAGNOSIS — M542 Cervicalgia: Secondary | ICD-10-CM | POA: Diagnosis not present

## 2020-01-29 DIAGNOSIS — M5412 Radiculopathy, cervical region: Secondary | ICD-10-CM | POA: Diagnosis not present

## 2020-01-29 DIAGNOSIS — M6281 Muscle weakness (generalized): Secondary | ICD-10-CM | POA: Diagnosis not present

## 2020-01-29 NOTE — Therapy (Signed)
[place], [place], [address] Phone: [phone]   Fax:  [phone]  Physical Therapy Treatment  Patient Details  Name: Lori Alvarez MRN: [id number] Date of Birth: [birth year] Referring Provider (PT): Elsner   Encounter Date: 01/29/2020  PT End of Session - 01/29/20 1544    Visit Number  36    Date for PT Re-Evaluation  01/27/20    PT Start Time  1441    PT Stop Time  1530    PT Time Calculation (min)  49 min    Activity Tolerance  Patient tolerated treatment well    Behavior During Therapy  [facility] for tasks assessed/performed       Past Medical History:  Diagnosis Date  . ALS (amyotrophic lateral sclerosis) ([place])   . Postherpetic neuralgia     Past Surgical History:  Procedure Laterality Date  . APPENDECTOMY    . PARTIAL HYSTERECTOMY      There were no vitals filed for this visit.  Subjective Assessment - 01/29/20 1542    Subjective  Patient reports some difficulty walking and difficulty raising head up when she lies down    Currently in Pain?  Yes    Pain Score  3     Pain Location  Neck    Aggravating Factors   worries about neck                       [facility] Adult PT Treatment/Exercise - 01/29/20 0001      Neck Exercises: Machines for Strengthening   Other Machines for Strengthening  leg extension 5# 2x 10, leg flexion 20#  2x 10, leg press 15# x 10    Other Machines for Strengthening  leg press 20# x10, then no weight single legs x 10 each with sets of 5      Neck Exercises: Standing   Other Standing Exercises  8" toe touches with close CGA    Other Standing Exercises  used the computer cart and had her push and pull 10 x with each arm working on hand, elbow and shoulder use as well as trunk stability      Manual Therapy   Manual Therapy  Soft tissue mobilization    Manual therapy comments  gentle stretches of the upper traps, some passive motions of the  hands, finger and the elbows approximation with motions of the fingers trying to get some increased movements and use, PROM of the shoulders, had some pain in the triceps and the lat with the end range flexion    Soft tissue mobilization  cervical area and the upper traps into the rhomboids, very tight and tender and she asked to be easy due to pain               PT Short Term Goals - 08/14/19 1426      PT SHORT TERM GOAL #1   Title  independent with initial HEP    Status  Achieved        PT Long Term Goals - 01/17/20 1615      PT LONG TERM GOAL #1   Title  report pain decreased 25%    Status  Partially Met      PT LONG TERM GOAL #2   Title  report that she can do her hair on her own    Status  Not Met      PT LONG TERM  GOAL #3   Title  increase right grip strength to 25#    Status  Not Met      PT LONG TERM GOAL #4   Title  increase cervical ROM 25%    Status  Achieved      PT LONG TERM GOAL #5   Title  patient will be able to manipulate controls ont he stair lift    Status  Partially Met            Plan - 01/29/20 1545    Clinical Impression Statement  Patient tried some sit to stand at home and we simulated today sets of 5 , she did well, she does explain that at night when she lays down she has difficulty raising her head, gave HEP for husband to do with her that is isometric neck exercises    PT Next Visit Plan  continue to work on blance and leg strength as ALS issues continue to arise    Consulted and Agree with Plan of Care  Patient       Patient will benefit from skilled therapeutic intervention in order to improve the following deficits and impairments:  Pain, Improper body mechanics, Impaired sensation, Increased muscle spasms, Postural dysfunction, Impaired tone, Impaired UE functional use, Decreased strength, Decreased range of motion, Decreased activity tolerance, Impaired flexibility  Visit Diagnosis: Muscle weakness  (generalized)  Cervicalgia  Radiculopathy, cervical region     Problem List Patient Active Problem List   Diagnosis Date Noted  . Small bowel obstruction ([place]) 12/06/2019  . SBO (small bowel obstruction) ([place]) 12/06/2019  . Collagenous colitis 07/28/2018  . Decreased sensation of lower extremity 07/20/2018  . ALS (amyotrophic lateral sclerosis) ([place]) 12/23/2017  . Muscle weakness (generalized) 11/23/2017  . Bilateral arm weakness 11/11/2017  . Elevated blood pressure reading without diagnosis of hypertension 11/11/2017  . Post herpetic neuralgia 11/11/2017  . MDS (myelodysplastic syndrome) ([place]) 01/22/2015    Sumner Boast., PT 01/29/2020, 3:52 PM  [address] [place], [place], [address] Phone: [phone]   Fax:  [phone]  Name: Lori Alvarez MRN: [id number] Date of Birth: [birth year]

## 2020-01-31 ENCOUNTER — Other Ambulatory Visit: Payer: Self-pay

## 2020-01-31 ENCOUNTER — Encounter: Payer: Self-pay | Admitting: Physical Therapy

## 2020-01-31 ENCOUNTER — Ambulatory Visit: Payer: Medicare Other | Admitting: Physical Therapy

## 2020-01-31 DIAGNOSIS — M542 Cervicalgia: Secondary | ICD-10-CM

## 2020-01-31 DIAGNOSIS — M6281 Muscle weakness (generalized): Secondary | ICD-10-CM | POA: Diagnosis not present

## 2020-01-31 DIAGNOSIS — M5412 Radiculopathy, cervical region: Secondary | ICD-10-CM

## 2020-01-31 NOTE — Therapy (Signed)
[place], [place], [address] Phone: [phone]   Fax:  [phone]  Physical Therapy Treatment  Patient Details  Name: Lori Alvarez MRN: [id number] Date of Birth: 18-Dec-1960 Referring Provider (PT): Elsner   Encounter Date: 01/31/2020  PT End of Session - 01/31/20 1549    Visit Number  37    Date for PT Re-Evaluation  02/26/20    PT Start Time  1445    PT Stop Time  1535    PT Time Calculation (min)  50 min    Activity Tolerance  Patient tolerated treatment well    Behavior During Therapy  [facility] for tasks assessed/performed       Past Medical History:  Diagnosis Date  . ALS (amyotrophic lateral sclerosis) ([place])   . Postherpetic neuralgia     Past Surgical History:  Procedure Laterality Date  . APPENDECTOMY    . PARTIAL HYSTERECTOMY      There were no vitals filed for this visit.  Subjective Assessment - 01/31/20 1442    Subjective  Patient reports that she has been very tight and sore the last day, unsure if we over did it on the STM    Currently in Pain?  Yes    Pain Score  3     Pain Location  Neck    Aggravating Factors   reports that last night her pain was an 8/10                       [facility] Adult PT Treatment/Exercise - 01/31/20 0001      High Level Balance   High Level Balance Activities  Negotiating over obstacles    High Level Balance Comments  soccer kicks, resisted gait side stepping      Neck Exercises: Machines for Strengthening   Other Machines for Strengthening  leg extension 5# 2x 10, leg flexion 20#  2x 10, leg press 15# x 10      Neck Exercises: Standing   Other Standing Exercises  8" toe touches with close CGA, 4" step ups    Other Standing Exercises  used the computer cart and had her push and pull 10 x with each arm working on hand, elbow and shoulder use as well as trunk stability      Neck Exercises: Seated   Other Seated Exercise   ball b/n knees squeeze    Other Seated Exercise  sliding board with pillow case slided for flexion and retraction and ER/IR, some finger exercises, passive stretch of the fingers and the wrist               PT Short Term Goals - 08/14/19 1426      PT SHORT TERM GOAL #1   Title  independent with initial HEP    Status  Achieved        PT Long Term Goals - 01/31/20 1551      PT LONG TERM GOAL #1   Title  report pain decreased 25%    Status  Partially Met      PT LONG TERM GOAL #2   Title  report that she can do her hair on her own    Status  Not Met      PT LONG TERM GOAL #3   Title  increase right grip strength to 25#    Status  Not Met      PT LONG  TERM GOAL #4   Status  Achieved      PT LONG TERM GOAL #5   Title  patient will be able to manipulate controls ont he stair lift    Status  Partially Met            Plan - 01/31/20 1550    Clinical Impression Statement  Patient really thinks that last treatment was too much in the neck mms, having more pain and tightness, she did not feel tight but was very tender, she was a little more off balance today, with the soccer kicks the stance leg seemed to give a little and needed CGA.    PT Next Visit Plan  continue to work on blance and leg strength as ALS issues continue to arise    Consulted and Agree with Plan of Care  Patient       Patient will benefit from skilled therapeutic intervention in order to improve the following deficits and impairments:  Pain, Improper body mechanics, Impaired sensation, Increased muscle spasms, Postural dysfunction, Impaired tone, Impaired UE functional use, Decreased strength, Decreased range of motion, Decreased activity tolerance, Impaired flexibility  Visit Diagnosis: Muscle weakness (generalized)  Cervicalgia  Radiculopathy, cervical region     Problem List Patient Active Problem List   Diagnosis Date Noted  . Small bowel obstruction ([place]) 12/06/2019  . SBO (small  bowel obstruction) ([place]) 12/06/2019  . Collagenous colitis 07/28/2018  . Decreased sensation of lower extremity 07/20/2018  . ALS (amyotrophic lateral sclerosis) ([place]) 12/23/2017  . Muscle weakness (generalized) 11/23/2017  . Bilateral arm weakness 11/11/2017  . Elevated blood pressure reading without diagnosis of hypertension 11/11/2017  . Post herpetic neuralgia 11/11/2017  . MDS (myelodysplastic syndrome) ([place]) 01/22/2015    Sumner Boast., PT 01/31/2020, 3:52 PM  [place] [place], [place], [address] Phone: [phone]   Fax:  [phone]  Name: Lori Alvarez MRN: [id number] Date of Birth: [birth year]

## 2020-02-05 ENCOUNTER — Ambulatory Visit: Payer: Medicare Other | Admitting: Physical Therapy

## 2020-02-05 ENCOUNTER — Encounter: Payer: Self-pay | Admitting: Physical Therapy

## 2020-02-05 ENCOUNTER — Other Ambulatory Visit: Payer: Self-pay

## 2020-02-05 DIAGNOSIS — M6281 Muscle weakness (generalized): Secondary | ICD-10-CM

## 2020-02-05 DIAGNOSIS — M5412 Radiculopathy, cervical region: Secondary | ICD-10-CM | POA: Diagnosis not present

## 2020-02-05 DIAGNOSIS — M542 Cervicalgia: Secondary | ICD-10-CM

## 2020-02-05 NOTE — Therapy (Signed)
[place], [place], [address] Phone: [phone]   Fax:  [phone]  Physical Therapy Treatment  Patient Details  Name: Lori Alvarez MRN: [id number] Date of Birth: Aug 13, 1961 Referring Provider (PT): Elsner   Encounter Date: 02/05/2020  PT End of Session - 02/05/20 1526    Visit Number  38    Date for PT Re-Evaluation  02/26/20    PT Start Time  1438    PT Stop Time  1529    PT Time Calculation (min)  51 min    Activity Tolerance  Patient tolerated treatment well;Patient limited by pain    Behavior During Therapy  [facility] for tasks assessed/performed       Past Medical History:  Diagnosis Date  . ALS (amyotrophic lateral sclerosis) ([place])   . Postherpetic neuralgia     Past Surgical History:  Procedure Laterality Date  . APPENDECTOMY    . PARTIAL HYSTERECTOMY      There were no vitals filed for this visit.  Subjective Assessment - 02/05/20 1524    Subjective  Patient reports that she had a good weekend, reports that she is feeling like her legs are feeling weak, reports that she was getting off the couch and was having difficulty, and her husband went to help and she ended up on her knees and then had difficulty getting up, she reports that her neck and shoulders are very sore now    Currently in Pain?  Yes    Pain Score  7     Pain Location  Neck    Pain Orientation  Right;Left    Pain Descriptors / Indicators  Aching;Sore    Aggravating Factors   getting up from the floor                       [facility] Adult PT Treatment/Exercise - 02/05/20 0001      Neck Exercises: Machines for Strengthening   Nustep  Level 3 x 7 minutes no arms    Other Machines for Strengthening  leg extension 5# 2x 10, leg flexion 20#  2x 10, leg press 15# x 10    Other Machines for Strengthening  leg press 20# x10, then no weight single legs x 10 each with sets of 5      Neck Exercises:  Seated   Other Seated Exercise  practiced sit to stand and gave instruction on how to be safe, suggested a gait belt    Other Seated Exercise  sliding board with pillow case slided for flexion and retraction and ER/IR, some finger exercises, passive stretch of the fingers and the wrist      Manual Therapy   Manual Therapy  Soft tissue mobilization    Manual therapy comments  gentle stretches of the upper traps, some passive motions of the hands, finger and the elbows approximation with motions of the fingers trying to get some increased movements and use, PROM of the shoulders, had some pain in the triceps and the lat with the end range flexion    Soft tissue mobilization  cervical area and the upper traps into the rhomboids, very tight and tender and she asked to be easy due to pain               PT Short Term Goals - 08/14/19 1426      PT SHORT TERM GOAL #1   Title  independent with  initial HEP    Status  Achieved        PT Long Term Goals - 01/31/20 1551      PT LONG TERM GOAL #1   Title  report pain decreased 25%    Status  Partially Met      PT LONG TERM GOAL #2   Title  report that she can do her hair on her own    Status  Not Met      PT LONG TERM GOAL #3   Title  increase right grip strength to 25#    Status  Not Met      PT LONG TERM GOAL #4   Status  Achieved      PT LONG TERM GOAL #5   Title  patient will be able to manipulate controls ont he stair lift    Status  Partially Met            Plan - 02/05/20 1527    Clinical Impression Statement  Patient reports that she feels like her legs are getting weak, she reports that she was having difficulty getting up and then when her husband helped she ended up in the floor on her knees, reports that getting up was very difficult and she is in more pain, she seemds to hve more neck pain and tightness, I suggested they use a gait belt at home, we also talked about walking but reports tha tshe is too tired     PT Next Visit Plan  tried to educate on gettin gup and how to not have strain on the arms and neck with the husband helping    Consulted and Agree with Plan of Care  Patient       Patient will benefit from skilled therapeutic intervention in order to improve the following deficits and impairments:  Pain, Improper body mechanics, Impaired sensation, Increased muscle spasms, Postural dysfunction, Impaired tone, Impaired UE functional use, Decreased strength, Decreased range of motion, Decreased activity tolerance, Impaired flexibility  Visit Diagnosis: Muscle weakness (generalized)  Cervicalgia  Radiculopathy, cervical region     Problem List Patient Active Problem List   Diagnosis Date Noted  . Small bowel obstruction ([place]) 12/06/2019  . SBO (small bowel obstruction) ([place]) 12/06/2019  . Collagenous colitis 07/28/2018  . Decreased sensation of lower extremity 07/20/2018  . ALS (amyotrophic lateral sclerosis) ([place]) 12/23/2017  . Muscle weakness (generalized) 11/23/2017  . Bilateral arm weakness 11/11/2017  . Elevated blood pressure reading without diagnosis of hypertension 11/11/2017  . Post herpetic neuralgia 11/11/2017  . MDS (myelodysplastic syndrome) ([place]) 01/22/2015    Sumner Boast., PT 02/05/2020, 3:31 PM  [address] [place], [place], [address] Phone: [phone]   Fax:  [phone]  Name: Lori Alvarez MRN: [id number] Date of Birth: 19-Sep-1961

## 2020-02-07 ENCOUNTER — Other Ambulatory Visit: Payer: Self-pay

## 2020-02-07 ENCOUNTER — Encounter: Payer: Self-pay | Admitting: Physical Therapy

## 2020-02-07 ENCOUNTER — Ambulatory Visit: Payer: Medicare Other | Admitting: Physical Therapy

## 2020-02-07 DIAGNOSIS — M6281 Muscle weakness (generalized): Secondary | ICD-10-CM

## 2020-02-07 DIAGNOSIS — M5412 Radiculopathy, cervical region: Secondary | ICD-10-CM | POA: Diagnosis not present

## 2020-02-07 DIAGNOSIS — M542 Cervicalgia: Secondary | ICD-10-CM | POA: Diagnosis not present

## 2020-02-07 NOTE — Therapy (Signed)
[place], [place], [address] Phone: [phone]   Fax:  [phone]  Physical Therapy Treatment  Patient Details  Name: TANJA GIFT MRN: [id number] Date of Birth: [birth year] Referring Provider (PT): Elsner   Encounter Date: 02/07/2020  PT End of Session - 02/07/20 1627    Visit Number  39    Date for PT Re-Evaluation  02/26/20    PT Start Time  1526    PT Stop Time  6314    PT Time Calculation (min)  48 min    Activity Tolerance  Patient tolerated treatment well;Patient limited by pain    Behavior During Therapy  [facility] for tasks assessed/performed       Past Medical History:  Diagnosis Date  . ALS (amyotrophic lateral sclerosis) ([place])   . Postherpetic neuralgia     Past Surgical History:  Procedure Laterality Date  . APPENDECTOMY    . PARTIAL HYSTERECTOMY      There were no vitals filed for this visit.  Subjective Assessment - 02/07/20 1438    Subjective  I still think I am hurting worse in the neck, I am substituting more recently    Currently in Pain?  Yes    Pain Score  8     Pain Location  Neck                       [facility] Adult PT Treatment/Exercise - 02/07/20 0001      Neck Exercises: Machines for Strengthening   Nustep  Level 3 x 7 minutes no arms    Other Machines for Strengthening  leg extension 5# 2x 10, leg flexion 15#  2x 10, leg press 20# x 10      Neck Exercises: Standing   Other Standing Exercises  used the computer cart and had her push and pull 10 x with each arm working on hand, elbow and shoulder use as well as trunk stability      Neck Exercises: Seated   Other Seated Exercise  sit to stand no arms x 5 19" seat height, did well less knee shaking    Other Seated Exercise  sliding board with pillow case slided for flexion and retraction and ER/IR, some finger exercises, passive stretch of the fingers and the wrist      Neck Exercises:  Supine   Cervical Isometrics  Flexion;Extension;Right lateral flexion;Left lateral flexion;3 secs;5 reps    Cervical Isometrics Limitations  tried all of these with some yellow tband but this was pulling her hair and uncomfortable      Moist Heat Therapy   Number Minutes Moist Heat  15 Minutes    Moist Heat Location  Cervical      Electrical Stimulation   Electrical Stimulation Location  upper trap and into the cervical area    Electrical Stimulation Action  IFC    Electrical Stimulation Parameters  sitting    Electrical Stimulation Goals  Pain               PT Short Term Goals - 08/14/19 1426      PT SHORT TERM GOAL #1   Title  independent with initial HEP    Status  Achieved        PT Long Term Goals - 02/07/20 1635      PT LONG TERM GOAL #1   Title  report pain decreased 25%    Status  Partially Met      PT LONG TERM GOAL #5   Title  patient will be able to manipulate controls ont he stair lift    Status  Partially Met            Plan - 02/07/20 1633    Clinical Impression Statement  Patient is reporting having difficulty lifting her head up in the morning off of the pillow, she als reports that when she is brushing her teeth she hsa difficulty raising her head up.  She is very weak and we had success with isometrics but trying with the tband seemed to pull her hair.  Legs looked stronger today and less instances of shaking    PT Next Visit Plan  will look at more cervical exercises    Consulted and Agree with Plan of Care  Patient       Patient will benefit from skilled therapeutic intervention in order to improve the following deficits and impairments:  Pain, Improper body mechanics, Impaired sensation, Increased muscle spasms, Postural dysfunction, Impaired tone, Impaired UE functional use, Decreased strength, Decreased range of motion, Decreased activity tolerance, Impaired flexibility  Visit Diagnosis: Muscle weakness  (generalized)  Cervicalgia  Radiculopathy, cervical region     Problem List Patient Active Problem List   Diagnosis Date Noted  . Small bowel obstruction ([place]) 12/06/2019  . SBO (small bowel obstruction) ([place]) 12/06/2019  . Collagenous colitis 07/28/2018  . Decreased sensation of lower extremity 07/20/2018  . ALS (amyotrophic lateral sclerosis) ([place]) 12/23/2017  . Muscle weakness (generalized) 11/23/2017  . Bilateral arm weakness 11/11/2017  . Elevated blood pressure reading without diagnosis of hypertension 11/11/2017  . Post herpetic neuralgia 11/11/2017  . MDS (myelodysplastic syndrome) ([place]) 01/22/2015    Sumner Boast., PT 02/07/2020, 4:36 PM  [address] [place], [place], [address] Phone: [phone]   Fax:  [phone]  Name: LAJEANA STROUGH MRN: [id number] Date of Birth: 1960-11-08

## 2020-02-09 DIAGNOSIS — K52831 Collagenous colitis: Secondary | ICD-10-CM | POA: Diagnosis not present

## 2020-02-09 DIAGNOSIS — K589 Irritable bowel syndrome without diarrhea: Secondary | ICD-10-CM | POA: Diagnosis not present

## 2020-02-09 DIAGNOSIS — R634 Abnormal weight loss: Secondary | ICD-10-CM | POA: Diagnosis not present

## 2020-02-12 ENCOUNTER — Other Ambulatory Visit: Payer: Self-pay

## 2020-02-12 ENCOUNTER — Ambulatory Visit: Payer: Medicare Other | Admitting: Physical Therapy

## 2020-02-12 ENCOUNTER — Encounter: Payer: Self-pay | Admitting: Physical Therapy

## 2020-02-12 DIAGNOSIS — M5412 Radiculopathy, cervical region: Secondary | ICD-10-CM | POA: Diagnosis not present

## 2020-02-12 DIAGNOSIS — M6281 Muscle weakness (generalized): Secondary | ICD-10-CM | POA: Diagnosis not present

## 2020-02-12 DIAGNOSIS — M542 Cervicalgia: Secondary | ICD-10-CM

## 2020-02-12 NOTE — Therapy (Signed)
[place], [place], [address] Phone: [phone]   Fax:  [phone] Progress Note Reporting Period 01/08/20 to 02/12/20 for visits 31-40 See note below for Objective Data and Assessment of Progress/Goals.      Physical Therapy Treatment  Patient Details  Name: Lori Alvarez MRN: [id number] Date of Birth: 11/01/1960 Referring Provider (PT): Elsner   Encounter Date: 02/12/2020  PT End of Session - 02/12/20 1553    Visit Number  40    Date for PT Re-Evaluation  02/26/20    PT Start Time  1440    PT Stop Time  1530    PT Time Calculation (min)  50 min    Activity Tolerance  Patient tolerated treatment well    Behavior During Therapy  [facility] for tasks assessed/performed       Past Medical History:  Diagnosis Date  . ALS (amyotrophic lateral sclerosis) ([place])   . Postherpetic neuralgia     Past Surgical History:  Procedure Laterality Date  . APPENDECTOMY    . PARTIAL HYSTERECTOMY      There were no vitals filed for this visit.  Subjective Assessment - 02/12/20 1444    Subjective  I had a pretty good weekend.    Currently in Pain?  Yes    Pain Score  4     Pain Location  Neck    Pain Orientation  Left;Right    Pain Descriptors / Indicators  Spasm                       [facility] Adult PT Treatment/Exercise - 02/12/20 0001      High Level Balance   High Level Balance Activities  Direction changes;Side stepping;Backward walking      Neck Exercises: Machines for Strengthening   Nustep  Level 4 x 6 minutes no arms    Other Machines for Strengthening  leg extension 5# 3x10, leg curls 20# x10, 15# x 10      Neck Exercises: Standing   Other Standing Exercises  8" toe touches with close CGA, 4" step ups      Neck Exercises: Seated   Other Seated Exercise  practice sit to stand 17" seat height with no hands, but then she had to use hands 2x3 reps    Other Seated Exercise   use of computer cart to have her push and pull with arms, use of velcro board having her peel items off for finger dexterity and hand use, could not do left if fully attached to the velcro      Moist Heat Therapy   Number Minutes Moist Heat  10 Minutes    Moist Heat Location  Cervical      Electrical Stimulation   Electrical Stimulation Location  upper trap and into the cervical area    Electrical Stimulation Action  IFC    Electrical Stimulation Parameters  seated    Electrical Stimulation Goals  Pain               PT Short Term Goals - 08/14/19 1426      PT SHORT TERM GOAL #1   Title  independent with initial HEP    Status  Achieved        PT Long Term Goals - 02/07/20 1635      PT LONG TERM GOAL #1   Title  report pain decreased 25%    Status  Partially  Met      PT LONG TERM GOAL #5   Title  patient will be able to manipulate controls ont he stair lift    Status  Partially Met            Plan - 02/12/20 1553    Clinical Impression Statement  Patient has ups and downs, the ALS is really taking a lot of finger and hand strength and dexterity.  She is expressing fear of her legs becoming weak and has always felt that her legs were her strongest.  She reports difficulty getting up from the couch and toilet.  She c/o some dizziness when I worked with her on the direction changes, she has trembling in the UE's with use.  Her neck pain overall is improved but due to the loss of arm function she continues to comepensate which increases the spasms and pain    PT Next Visit Plan  continue to work with her deficits    Consulted and Agree with Plan of Care  Patient       Patient will benefit from skilled therapeutic intervention in order to improve the following deficits and impairments:  Pain, Improper body mechanics, Impaired sensation, Increased muscle spasms, Postural dysfunction, Impaired tone, Impaired UE functional use, Decreased strength, Decreased range of  motion, Decreased activity tolerance, Impaired flexibility  Visit Diagnosis: Muscle weakness (generalized)  Cervicalgia  Radiculopathy, cervical region     Problem List Patient Active Problem List   Diagnosis Date Noted  . Small bowel obstruction ([place]) 12/06/2019  . SBO (small bowel obstruction) ([place]) 12/06/2019  . Collagenous colitis 07/28/2018  . Decreased sensation of lower extremity 07/20/2018  . ALS (amyotrophic lateral sclerosis) ([place]) 12/23/2017  . Muscle weakness (generalized) 11/23/2017  . Bilateral arm weakness 11/11/2017  . Elevated blood pressure reading without diagnosis of hypertension 11/11/2017  . Post herpetic neuralgia 11/11/2017  . MDS (myelodysplastic syndrome) ([place]) 01/22/2015    Sumner Boast., PT 02/12/2020, 3:58 PM  [address] [place], [place], [address] Phone: [phone]   Fax:  [phone]  Name: Lori Alvarez MRN: [id number] Date of Birth: 10-05-1961

## 2020-02-14 ENCOUNTER — Encounter: Payer: Medicare Other | Admitting: Physical Therapy

## 2020-02-19 ENCOUNTER — Ambulatory Visit: Payer: Medicare Other | Attending: Neurological Surgery | Admitting: Physical Therapy

## 2020-02-19 ENCOUNTER — Other Ambulatory Visit: Payer: Self-pay

## 2020-02-19 DIAGNOSIS — M542 Cervicalgia: Secondary | ICD-10-CM | POA: Insufficient documentation

## 2020-02-19 DIAGNOSIS — M6281 Muscle weakness (generalized): Secondary | ICD-10-CM | POA: Diagnosis not present

## 2020-02-19 DIAGNOSIS — M5412 Radiculopathy, cervical region: Secondary | ICD-10-CM | POA: Insufficient documentation

## 2020-02-19 NOTE — Therapy (Signed)
[place], [place], [address] Phone: [phone]   Fax:  [phone]  Physical Therapy Treatment  Patient Details  Name: Lori Alvarez MRN: [id number] Date of Birth: 07-Dec-1960 Referring Provider (PT): Elsner   Encounter Date: 02/19/2020  PT End of Session - 02/19/20 1534    Visit Number  41    Date for PT Re-Evaluation  02/26/20    PT Start Time  1435    PT Stop Time  1520    PT Time Calculation (min)  45 min    Activity Tolerance  Patient tolerated treatment well    Behavior During Therapy  [facility] for tasks assessed/performed       Past Medical History:  Diagnosis Date  . ALS (amyotrophic lateral sclerosis) ([place])   . Postherpetic neuralgia     Past Surgical History:  Procedure Laterality Date  . APPENDECTOMY    . PARTIAL HYSTERECTOMY      There were no vitals filed for this visit.  Subjective Assessment - 02/19/20 1446    Subjective  Patient has an appointment with the ALS clinic on Wednesday.  This will be her first time in person in aobut 10-11 months.  reports that her legs have been feeling weak    Currently in Pain?  Yes    Pain Score  5     Pain Location  Neck    Aggravating Factors   weather                       [facility] Adult PT Treatment/Exercise - 02/19/20 0001      High Level Balance   High Level Balance Activities  Direction changes;Side stepping;Backward walking    High Level Balance Comments  soccer kicks, resisted gait side stepping      Neck Exercises: Machines for Strengthening   Nustep  Level 4 x 6 minutes no arms    Other Machines for Strengthening  leg extension 5# 3x10, leg curls 20# x10, 15# x 10      Manual Therapy   Manual Therapy  Soft tissue mobilization    Manual therapy comments  gentle stretches of the upper traps, some passive motions of the hands, finger and the elbows approximation with motions of the fingers trying to get some  increased movements and use, PROM of the shoulders, had some pain in the triceps and the lat with the end range flexion    Soft tissue mobilization  cervical area and the upper traps into the rhomboids, very tight and tender and she asked to be easy due to pain    Manual Traction  occipital release, shoulder depression               PT Short Term Goals - 08/14/19 1426      PT SHORT TERM GOAL #1   Title  independent with initial HEP    Status  Achieved        PT Long Term Goals - 02/07/20 1635      PT LONG TERM GOAL #1   Title  report pain decreased 25%    Status  Partially Met      PT LONG TERM GOAL #5   Title  patient will be able to manipulate controls ont he stair lift    Status  Partially Met            Plan - 02/19/20 1535    Clinical  Impression Statement  PAtient continues to report that she has ups and downs with her walking and with different mms that are getting weaker, she continues to have ups and downs with the ability to get up and down from the toilet, reports that she has difficulty raising her head after brushing teeth    PT Next Visit Plan  continue to work with her deficits    Consulted and Agree with Plan of Care  Patient       Patient will benefit from skilled therapeutic intervention in order to improve the following deficits and impairments:  Pain, Improper body mechanics, Impaired sensation, Increased muscle spasms, Postural dysfunction, Impaired tone, Impaired UE functional use, Decreased strength, Decreased range of motion, Decreased activity tolerance, Impaired flexibility  Visit Diagnosis: Muscle weakness (generalized)  Cervicalgia  Radiculopathy, cervical region     Problem List Patient Active Problem List   Diagnosis Date Noted  . Small bowel obstruction ([place]) 12/06/2019  . SBO (small bowel obstruction) ([place]) 12/06/2019  . Collagenous colitis 07/28/2018  . Decreased sensation of lower extremity 07/20/2018  . ALS  (amyotrophic lateral sclerosis) ([place]) 12/23/2017  . Muscle weakness (generalized) 11/23/2017  . Bilateral arm weakness 11/11/2017  . Elevated blood pressure reading without diagnosis of hypertension 11/11/2017  . Post herpetic neuralgia 11/11/2017  . MDS (myelodysplastic syndrome) ([place]) 01/22/2015    Sumner Boast., PT 02/19/2020, 3:40 PM  [place] [address] [place], [place], [address] Phone: [phone]   Fax:  [phone]  Name: Lori Alvarez MRN: [id number] Date of Birth: Feb 27, 1961

## 2020-02-21 ENCOUNTER — Ambulatory Visit: Payer: Medicare Other | Admitting: Physical Therapy

## 2020-02-21 DIAGNOSIS — G1221 Amyotrophic lateral sclerosis: Secondary | ICD-10-CM | POA: Diagnosis not present

## 2020-02-26 ENCOUNTER — Encounter: Payer: Self-pay | Admitting: Physical Therapy

## 2020-02-26 ENCOUNTER — Ambulatory Visit: Payer: Medicare Other | Admitting: Physical Therapy

## 2020-02-26 ENCOUNTER — Other Ambulatory Visit: Payer: Self-pay

## 2020-02-26 DIAGNOSIS — M5412 Radiculopathy, cervical region: Secondary | ICD-10-CM

## 2020-02-26 DIAGNOSIS — M6281 Muscle weakness (generalized): Secondary | ICD-10-CM

## 2020-02-26 DIAGNOSIS — M542 Cervicalgia: Secondary | ICD-10-CM | POA: Diagnosis not present

## 2020-02-26 NOTE — Therapy (Signed)
[place], [place], [address] Phone: [phone]   Fax:  [phone]  Physical Therapy Treatment  Patient Details  Name: Lori Alvarez MRN: [id number] Date of Birth: 09/24/1961 Referring Provider (PT): Elsner   Encounter Date: 02/26/2020  PT End of Session - 02/26/20 1745    Visit Number  42    Date for PT Re-Evaluation  03/28/20    PT Start Time  1436    PT Stop Time  1525    PT Time Calculation (min)  49 min    Activity Tolerance  Patient tolerated treatment well    Behavior During Therapy  [facility] for tasks assessed/performed       Past Medical History:  Diagnosis Date  . ALS (amyotrophic lateral sclerosis) ([place])   . Postherpetic neuralgia     Past Surgical History:  Procedure Laterality Date  . APPENDECTOMY    . PARTIAL HYSTERECTOMY      There were no vitals filed for this visit.  Subjective Assessment - 02/26/20 1534    Subjective  Patient reports that she went to the [place] clinic, she has lost weight and her breathing is not as good as the last time, she reports that it was recommended that she cotninue PT    Currently in Pain?  Yes    Pain Score  4     Pain Location  Neck    Aggravating Factors   stress         [facility] PT Assessment - 02/26/20 0001      Assessment   Medical Diagnosis  cervical stenosis    Referring Provider (PT)  Elsner                   [facility] Adult PT Treatment/Exercise - 02/26/20 0001      Ambulation/Gait   Gait Comments  stairs one at a time with CGA up and down      Neck Exercises: Machines for Strengthening   Nustep  Level 4 x 6 minutes no arms    Other Machines for Strengthening  leg extension 5# 3x10, leg curls 20# x10, 15# x 10    Other Machines for Strengthening  leg press 20# x10, then no weight single legs x 10 each with sets of 5      Neck Exercises: Sidelying   Other Sidelying Exercise  clams bilaterally      Manual  Therapy   Manual Therapy  Soft tissue mobilization    Soft tissue mobilization  cervical, upper traps and rhomboids    Manual Traction  occipital release, shoulder depression               PT Short Term Goals - 08/14/19 1426      PT SHORT TERM GOAL #1   Title  independent with initial HEP    Status  Achieved        PT Long Term Goals - 02/26/20 1748      PT LONG TERM GOAL #1   Title  report pain decreased 25%    Status  Partially Met      PT LONG TERM GOAL #2   Title  report that she can do her hair on her own    Status  Not Met      PT LONG TERM GOAL #3   Title  increase right grip strength to 25#    Status  Not Met  PT LONG TERM GOAL #4   Title  increase cervical ROM 25%    Status  Achieved      PT LONG TERM GOAL #5   Title  patient will be able to manipulate controls ont he stair lift    Status  Partially Met      Additional Long Term Goals   Additional Long Term Goals  Yes      PT LONG TERM GOAL #6   Title  get up from sitting 17" height without UE 4 of 5 times    Time  8    Period  Weeks    Status  New            Plan - 02/26/20 1746    Clinical Impression Statement  Patient reports tha tshe was seen at the [place] clinic last week and was worn out, it was recommended for her to continue PT to address strength and functional needs.  She reports that she at times feels like she is fighting a losing battle and feels that she is getting weaker, worries about her legs.  Reports fear of falling and fear of not being able to walk.  She at times will do very well with strength, her hands and arms are drastically having atrophy and loss of function, her legs seem to be doing well but she again reports difficulty with stairs and getting up from sitting    PT Frequency  2x / week    PT Duration  4 weeks    PT Treatment/Interventions  ADLs/Self Care Home Management;Cryotherapy;Electrical Stimulation;Moist Heat;Traction;Ultrasound;Therapeutic  activities;Therapeutic exercise;Neuromuscular re-education;Manual techniques;Dry needling;Patient/family education;Taping    PT Next Visit Plan  continue to work with her deficits    Consulted and Agree with Plan of Care  Patient       Patient will benefit from skilled therapeutic intervention in order to improve the following deficits and impairments:  Pain, Improper body mechanics, Impaired sensation, Increased muscle spasms, Postural dysfunction, Impaired tone, Impaired UE functional use, Decreased strength, Decreased range of motion, Decreased activity tolerance, Impaired flexibility  Visit Diagnosis: Muscle weakness (generalized) - Plan: PT plan of care cert/re-cert  Cervicalgia - Plan: PT plan of care cert/re-cert  Radiculopathy, cervical region - Plan: PT plan of care cert/re-cert     Problem List Patient Active Problem List   Diagnosis Date Noted  . Small bowel obstruction ([place]) 12/06/2019  . SBO (small bowel obstruction) ([place]) 12/06/2019  . Collagenous colitis 07/28/2018  . Decreased sensation of lower extremity 07/20/2018  . ALS (amyotrophic lateral sclerosis) ([place]) 12/23/2017  . Muscle weakness (generalized) 11/23/2017  . Bilateral arm weakness 11/11/2017  . Elevated blood pressure reading without diagnosis of hypertension 11/11/2017  . Post herpetic neuralgia 11/11/2017  . MDS (myelodysplastic syndrome) ([place]) 01/22/2015    Sumner Boast., PT 02/26/2020, 5:51 PM  [place] [address] [place], [place], [address] Phone: [phone]   Fax:  [phone]  Name: Lori Alvarez MRN: [id number] Date of Birth: 12/09/1960

## 2020-02-27 ENCOUNTER — Telehealth: Payer: Self-pay

## 2020-02-27 NOTE — Telephone Encounter (Signed)
Telephone call to patient to schedule palliative care visit with patient. SW left message requesting a call back to schedule a visit soon.

## 2020-02-28 ENCOUNTER — Encounter: Payer: Self-pay | Admitting: Physical Therapy

## 2020-02-28 ENCOUNTER — Ambulatory Visit: Payer: Medicare Other | Admitting: Physical Therapy

## 2020-02-28 ENCOUNTER — Other Ambulatory Visit: Payer: Self-pay

## 2020-02-28 DIAGNOSIS — Z66 Do not resuscitate: Secondary | ICD-10-CM | POA: Diagnosis not present

## 2020-02-28 DIAGNOSIS — M6281 Muscle weakness (generalized): Secondary | ICD-10-CM

## 2020-02-28 DIAGNOSIS — M5412 Radiculopathy, cervical region: Secondary | ICD-10-CM

## 2020-02-28 DIAGNOSIS — Z20822 Contact with and (suspected) exposure to covid-19: Secondary | ICD-10-CM | POA: Diagnosis not present

## 2020-02-28 DIAGNOSIS — B0229 Other postherpetic nervous system involvement: Secondary | ICD-10-CM | POA: Diagnosis not present

## 2020-02-28 DIAGNOSIS — K56609 Unspecified intestinal obstruction, unspecified as to partial versus complete obstruction: Secondary | ICD-10-CM | POA: Diagnosis not present

## 2020-02-28 DIAGNOSIS — E860 Dehydration: Secondary | ICD-10-CM | POA: Diagnosis not present

## 2020-02-28 DIAGNOSIS — K760 Fatty (change of) liver, not elsewhere classified: Secondary | ICD-10-CM | POA: Diagnosis not present

## 2020-02-28 DIAGNOSIS — K5730 Diverticulosis of large intestine without perforation or abscess without bleeding: Secondary | ICD-10-CM | POA: Diagnosis not present

## 2020-02-28 DIAGNOSIS — G1221 Amyotrophic lateral sclerosis: Secondary | ICD-10-CM | POA: Diagnosis not present

## 2020-02-28 DIAGNOSIS — R1084 Generalized abdominal pain: Secondary | ICD-10-CM | POA: Diagnosis not present

## 2020-02-28 DIAGNOSIS — Z03818 Encounter for observation for suspected exposure to other biological agents ruled out: Secondary | ICD-10-CM | POA: Diagnosis not present

## 2020-02-28 DIAGNOSIS — M542 Cervicalgia: Secondary | ICD-10-CM

## 2020-02-28 DIAGNOSIS — R9431 Abnormal electrocardiogram [ECG] [EKG]: Secondary | ICD-10-CM | POA: Diagnosis not present

## 2020-02-28 NOTE — Therapy (Signed)
[place], [place], [address] Phone: [phone]   Fax:  [phone]  Physical Therapy Treatment  Patient Details  Name: LOREL LEMBO MRN: [id number] Date of Birth: 29-May-1961 Referring Provider (PT): Elsner   Encounter Date: 02/28/2020  PT End of Session - 02/28/20 1637    Visit Number  43    Date for PT Re-Evaluation  03/28/20    PT Start Time  1435    PT Stop Time  1520    PT Time Calculation (min)  45 min    Activity Tolerance  Patient limited by fatigue    Behavior During Therapy  [facility] for tasks assessed/performed       Past Medical History:  Diagnosis Date  . ALS (amyotrophic lateral sclerosis) ([place])   . Postherpetic neuralgia     Past Surgical History:  Procedure Laterality Date  . APPENDECTOMY    . PARTIAL HYSTERECTOMY      There were no vitals filed for this visit.  Subjective Assessment - 02/28/20 1533    Subjective  Patient reports that she is having increased left arm and pectoral pain.  "feels tight"  asked about MD and other signs of MI, she denied    Currently in Pain?  Yes    Pain Score  7     Pain Location  Arm    Pain Orientation  Left;Upper                        [facility] Adult PT Treatment/Exercise - 02/28/20 0001      Ambulation/Gait   Gait Comments  stairs one at a time down but only half a flight due to weakness and not feeling steady, stairs up step over step      Neck Exercises: Machines for Strengthening   Nustep  Level 4 x 6 minutes no arms      Neck Exercises: Sidelying   Other Sidelying Exercise  clams bilaterally      Moist Heat Therapy   Number Minutes Moist Heat  10 Minutes    Moist Heat Location  Cervical      Electrical Stimulation   Electrical Stimulation Location  upper trap and into the cervical area    Electrical Stimulation Action  IFC    Electrical Stimulation Parameters  seated    Electrical Stimulation Goals   Pain      Manual Therapy   Manual Therapy  Soft tissue mobilization    Manual therapy comments  gentle stretches of the upper traps, some passive motions of the hands, finger and the elbows approximation with motions of the fingers trying to get some increased movements and use, PROM of the shoulders, had some pain in the triceps and the lat with the end range flexion    Soft tissue mobilization  cervical, upper traps and rhomboids, left pectoral and left biceps               PT Short Term Goals - 08/14/19 1426      PT SHORT TERM GOAL #1   Title  independent with initial HEP    Status  Achieved        PT Long Term Goals - 02/28/20 1639      PT LONG TERM GOAL #1   Title  report pain decreased 25%    Status  Partially Met            Plan -  02/28/20 1637    Clinical Impression Statement  Patient reports that she is feeling very tired and fatigued today, she had difficulty with the stairs today, she was more unsteady with her gait.  She had pain and tightness and tenderness in the left pectoral and the left bicep today    PT Next Visit Plan  continue to work with her deficits    Consulted and Agree with Plan of Care  Patient       Patient will benefit from skilled therapeutic intervention in order to improve the following deficits and impairments:  Pain, Improper body mechanics, Impaired sensation, Increased muscle spasms, Postural dysfunction, Impaired tone, Impaired UE functional use, Decreased strength, Decreased range of motion, Decreased activity tolerance, Impaired flexibility  Visit Diagnosis: Muscle weakness (generalized)  Cervicalgia  Radiculopathy, cervical region     Problem List Patient Active Problem List   Diagnosis Date Noted  . Small bowel obstruction ([place]) 12/06/2019  . SBO (small bowel obstruction) ([place]) 12/06/2019  . Collagenous colitis 07/28/2018  . Decreased sensation of lower extremity 07/20/2018  . ALS (amyotrophic lateral sclerosis)  ([place]) 12/23/2017  . Muscle weakness (generalized) 11/23/2017  . Bilateral arm weakness 11/11/2017  . Elevated blood pressure reading without diagnosis of hypertension 11/11/2017  . Post herpetic neuralgia 11/11/2017  . MDS (myelodysplastic syndrome) ([place]) 01/22/2015    Sumner Boast., PT 02/28/2020, 4:39 PM  [place] [address] [place], [place], [address] Phone: [phone]   Fax:  [phone]  Name: TANESHIA LORENCE MRN: [id number] Date of Birth: [birth year]

## 2020-02-29 ENCOUNTER — Other Ambulatory Visit: Payer: Self-pay | Admitting: Physician Assistant

## 2020-02-29 ENCOUNTER — Ambulatory Visit
Admission: RE | Admit: 2020-02-29 | Discharge: 2020-02-29 | Disposition: A | Discharge status: Home / Self Care | Payer: Medicare Other | Source: Ambulatory Visit | Attending: Physician Assistant | Admitting: Physician Assistant

## 2020-02-29 DIAGNOSIS — R109 Unspecified abdominal pain: Secondary | ICD-10-CM

## 2020-02-29 DIAGNOSIS — R1084 Generalized abdominal pain: Secondary | ICD-10-CM

## 2020-02-29 DIAGNOSIS — Z8719 Personal history of other diseases of the digestive system: Secondary | ICD-10-CM | POA: Diagnosis not present

## 2020-02-29 DIAGNOSIS — K56609 Unspecified intestinal obstruction, unspecified as to partial versus complete obstruction: Secondary | ICD-10-CM | POA: Diagnosis not present

## 2020-03-01 ENCOUNTER — Inpatient Hospital Stay (HOSPITAL_COMMUNITY)
Admission: EM | Admit: 2020-03-01 | Discharge: 2020-03-03 | DRG: 389 | Disposition: A | Discharge status: Home / Self Care | Payer: Medicare Other | Attending: Internal Medicine | Admitting: Internal Medicine

## 2020-03-01 ENCOUNTER — Inpatient Hospital Stay (HOSPITAL_COMMUNITY): Payer: Medicare Other

## 2020-03-01 ENCOUNTER — Emergency Department (HOSPITAL_COMMUNITY): Payer: Medicare Other

## 2020-03-01 ENCOUNTER — Encounter (HOSPITAL_COMMUNITY): Payer: Self-pay | Admitting: Emergency Medicine

## 2020-03-01 DIAGNOSIS — G1221 Amyotrophic lateral sclerosis: Secondary | ICD-10-CM | POA: Diagnosis present

## 2020-03-01 DIAGNOSIS — Z881 Allergy status to other antibiotic agents status: Secondary | ICD-10-CM | POA: Diagnosis not present

## 2020-03-01 DIAGNOSIS — R1084 Generalized abdominal pain: Secondary | ICD-10-CM | POA: Diagnosis not present

## 2020-03-01 DIAGNOSIS — Z79899 Other long term (current) drug therapy: Secondary | ICD-10-CM | POA: Diagnosis not present

## 2020-03-01 DIAGNOSIS — Z888 Allergy status to other drugs, medicaments and biological substances status: Secondary | ICD-10-CM | POA: Diagnosis not present

## 2020-03-01 DIAGNOSIS — Z882 Allergy status to sulfonamides status: Secondary | ICD-10-CM

## 2020-03-01 DIAGNOSIS — D469 Myelodysplastic syndrome, unspecified: Secondary | ICD-10-CM | POA: Diagnosis present

## 2020-03-01 DIAGNOSIS — R109 Unspecified abdominal pain: Secondary | ICD-10-CM | POA: Diagnosis present

## 2020-03-01 DIAGNOSIS — Z20822 Contact with and (suspected) exposure to covid-19: Secondary | ICD-10-CM | POA: Diagnosis present

## 2020-03-01 DIAGNOSIS — R9431 Abnormal electrocardiogram [ECG] [EKG]: Secondary | ICD-10-CM | POA: Diagnosis not present

## 2020-03-01 DIAGNOSIS — Z66 Do not resuscitate: Secondary | ICD-10-CM | POA: Diagnosis present

## 2020-03-01 DIAGNOSIS — E860 Dehydration: Secondary | ICD-10-CM | POA: Diagnosis present

## 2020-03-01 DIAGNOSIS — K56609 Unspecified intestinal obstruction, unspecified as to partial versus complete obstruction: Principal | ICD-10-CM | POA: Diagnosis present

## 2020-03-01 DIAGNOSIS — Z8051 Family history of malignant neoplasm of kidney: Secondary | ICD-10-CM | POA: Diagnosis not present

## 2020-03-01 DIAGNOSIS — Z90711 Acquired absence of uterus with remaining cervical stump: Secondary | ICD-10-CM

## 2020-03-01 DIAGNOSIS — K5730 Diverticulosis of large intestine without perforation or abscess without bleeding: Secondary | ICD-10-CM | POA: Diagnosis not present

## 2020-03-01 DIAGNOSIS — K5289 Other specified noninfective gastroenteritis and colitis: Secondary | ICD-10-CM | POA: Diagnosis not present

## 2020-03-01 DIAGNOSIS — K760 Fatty (change of) liver, not elsewhere classified: Secondary | ICD-10-CM | POA: Diagnosis not present

## 2020-03-01 DIAGNOSIS — Z03818 Encounter for observation for suspected exposure to other biological agents ruled out: Secondary | ICD-10-CM | POA: Diagnosis not present

## 2020-03-01 DIAGNOSIS — B0229 Other postherpetic nervous system involvement: Secondary | ICD-10-CM | POA: Diagnosis present

## 2020-03-01 LAB — COMPREHENSIVE METABOLIC PANEL
ALT: 24 U/L (ref 0–44)
AST: 23 U/L (ref 15–41)
Albumin: 4.1 g/dL (ref 3.5–5.0)
Alkaline Phosphatase: 106 U/L (ref 38–126)
Anion gap: 13 (ref 5–15)
BUN: 10 mg/dL (ref 6–20)
CO2: 22 mmol/L (ref 22–32)
Calcium: 9.1 mg/dL (ref 8.9–10.3)
Chloride: 100 mmol/L (ref 98–111)
Creatinine, Ser: 0.42 mg/dL — ABNORMAL LOW (ref 0.44–1.00)
GFR calc Af Amer: >60 mL/min (ref >60)
GFR calc non Af Amer: >60 mL/min (ref >60)
Glucose, Bld: 104 mg/dL — ABNORMAL HIGH (ref 70–99)
Potassium: 4.6 mmol/L (ref 3.5–5.1)
Sodium: 135 mmol/L (ref 135–145)
Total Bilirubin: 0.7 mg/dL (ref 0.3–1.2)
Total Protein: 7.4 g/dL (ref 6.5–8.1)

## 2020-03-01 LAB — URINALYSIS, ROUTINE W REFLEX MICROSCOPIC
Bilirubin Urine: NEGATIVE
Glucose, UA: NEGATIVE mg/dL
Hgb urine dipstick: NEGATIVE
Ketones, ur: 20 mg/dL — AB
Leukocytes,Ua: NEGATIVE
Nitrite: NEGATIVE
Protein, ur: NEGATIVE mg/dL
Specific Gravity, Urine: 1.004 — ABNORMAL LOW (ref 1.005–1.030)
pH: 6 (ref 5.0–8.0)

## 2020-03-01 LAB — CBC
HCT: 42.3 % (ref 36.0–46.0)
Hemoglobin: 13.8 g/dL (ref 12.0–15.0)
MCH: 29 pg (ref 26.0–34.0)
MCHC: 32.6 g/dL (ref 30.0–36.0)
MCV: 88.9 fL (ref 80.0–100.0)
Platelets: 436 10*3/uL — ABNORMAL HIGH (ref 150–400)
RBC: 4.76 MIL/uL (ref 3.87–5.11)
RDW: 15.9 % — ABNORMAL HIGH (ref 11.5–15.5)
WBC: 10.6 10*3/uL — ABNORMAL HIGH (ref 4.0–10.5)
nRBC: 0 % (ref 0.0–0.2)

## 2020-03-01 LAB — SARS CORONAVIRUS 2 BY RT PCR (HOSPITAL ORDER, PERFORMED IN ~~LOC~~ HOSPITAL LAB): SARS Coronavirus 2: NEGATIVE

## 2020-03-01 LAB — LIPASE, BLOOD: Lipase: 22 U/L (ref 11–51)

## 2020-03-01 MED ORDER — DIATRIZOATE MEGLUMINE & SODIUM 66-10 % PO SOLN: 90.0000 mL | Freq: Once | ORAL | Status: DC

## 2020-03-01 MED ORDER — MORPHINE SULFATE (PF) 2 MG/ML IV SOLN
1.0000 mg | INTRAVENOUS | Status: DC | PRN
  Administered 2020-03-01: 1 mg via INTRAVENOUS
  Filled 2020-03-01: qty 1

## 2020-03-01 MED ORDER — SODIUM CHLORIDE (PF) 0.9 % IJ SOLN
INTRAMUSCULAR | Status: AC
  Filled 2020-03-01: qty 50

## 2020-03-01 MED ORDER — DEXTROSE-NACL 5-0.9 % IV SOLN: INTRAVENOUS | Status: AC

## 2020-03-01 MED ORDER — PIPERACILLIN-TAZOBACTAM 3.375 G IVPB 30 MIN
3.3750 g | Freq: Once | INTRAVENOUS | Status: AC
  Administered 2020-03-01: 3.375 g via INTRAVENOUS
  Filled 2020-03-01: qty 50

## 2020-03-01 MED ORDER — IOHEXOL 300 MG/ML  SOLN
100.0000 mL | Freq: Once | INTRAMUSCULAR | Status: AC | PRN
  Administered 2020-03-01: 100 mL via INTRAVENOUS

## 2020-03-01 MED ORDER — SODIUM CHLORIDE 0.9 % IV SOLN: Freq: Once | INTRAVENOUS | Status: AC

## 2020-03-01 MED ORDER — ONDANSETRON HCL 4 MG/2ML IJ SOLN
4.0000 mg | Freq: Four times a day (QID) | INTRAMUSCULAR | Status: DC | PRN
  Administered 2020-03-01: 4 mg via INTRAVENOUS
  Filled 2020-03-01: qty 2

## 2020-03-01 MED ORDER — ONDANSETRON HCL 4 MG PO TABS: 4.0000 mg | ORAL_TABLET | Freq: Four times a day (QID) | ORAL | Status: DC | PRN

## 2020-03-01 MED ORDER — PIPERACILLIN-TAZOBACTAM 3.375 G IVPB: 3.3750 g | Freq: Three times a day (TID) | INTRAVENOUS | Status: DC

## 2020-03-01 NOTE — Progress Notes (Signed)
Pharmacy Antibiotic Note  Lori Alvarez is a 59 y.o. female presented to the ED on 03/01/2020 with c/o abdominal pain.  Abdominal CT showed findings consistent with enteritis. Pharmacy has been consulted to start zosyn for intra-abdominal infection.  Plan: - zosyn 3.375 gm IV x1 over 30 min, then 3.375 gm IV q8h (infuse over 4 hrs)  _________________________________  Temp (24hrs), Avg:98.1 F (36.7 C), Min:98.1 F (36.7 C), Max:98.1 F (36.7 C)  Recent Labs  Lab 03/01/20 1733  WBC 10.6*  CREATININE 0.42*    CrCl cannot be calculated (Unknown ideal weight.).    Allergies  Allergen Reactions  . Allegra [Fexofenadine]     Ringing in ears   . Clotrim-Undecyl-Tea Tr-Lav Oil     unknown  . Doxepin     Headaches  . Keflex [Cephalexin]     "too strong, made me drowsy and dizzy"  . Nitroglycerin     Migraine  . Nitrofuran Derivatives Rash  . Sulfa Antibiotics Rash     Thank you for allowing pharmacy to be a part of this patient's care.  Lynelle Doctor 03/01/2020 10:36 PM

## 2020-03-01 NOTE — ED Provider Notes (Signed)
[facility] DEPT Provider Note   CSN: [id number] Arrival date & time: 03/01/20  1558     History Chief Complaint  Patient presents with  . Abdominal Pain    Lori Alvarez is a 59 y.o. female.  59 year old female with prior medical history as detailed below presents for evaluation of abdominal pain.  Patient reports prior history of SBO.  Patient reports that over the last 3 to 4 days she has had intermittent abdominal discomfort similar to prior episode of SBO.  Patient reports plain films obtained yesterday demonstrated air-fluid levels and dilated small bowel.  She was treated without operative intervention or NG placement during her last SBO.  The history is provided by the patient and medical records.  Abdominal Pain Pain location:  Generalized Pain quality: aching and cramping   Pain radiates to:  Does not radiate Pain severity:  Mild Onset quality:  Gradual Duration:  4 days Timing:  Constant Progression:  Waxing and waning Chronicity:  Recurrent Relieved by:  Nothing Worsened by:  Nothing      Past Medical History:  Diagnosis Date  . ALS (amyotrophic lateral sclerosis) ([place])   . Postherpetic neuralgia     Patient Active Problem List   Diagnosis Date Noted  . Small bowel obstruction ([place]) 12/06/2019  . SBO (small bowel obstruction) ([place]) 12/06/2019  . Collagenous colitis 07/28/2018  . Decreased sensation of lower extremity 07/20/2018  . ALS (amyotrophic lateral sclerosis) ([place]) 12/23/2017  . Muscle weakness (generalized) 11/23/2017  . Bilateral arm weakness 11/11/2017  . Elevated blood pressure reading without diagnosis of hypertension 11/11/2017  . Post herpetic neuralgia 11/11/2017  . MDS (myelodysplastic syndrome) ([place]) 01/22/2015    Past Surgical History:  Procedure Laterality Date  . APPENDECTOMY    . PARTIAL HYSTERECTOMY       OB History   No obstetric history on file.     Family History  Problem Relation  Age of Onset  . Myopathy Mother   . Cancer Father        renal    Social History   Tobacco Use  . Smoking status: Never Smoker  . Smokeless tobacco: Never Used  Substance Use Topics  . Alcohol use: No  . Drug use: No    Home Medications Prior to Admission medications   Medication Sig Start Date End Date Taking? Authorizing Provider  acetaminophen (TYLENOL) 500 MG tablet Take 500 mg by mouth in the morning, at noon, in the evening, and at bedtime.   Yes [provider]  ALPRAZolam (XANAX) 0.5 MG tablet Take 0.25-0.5 mg by mouth 2 (two) times daily. Take 1/2 tablet (0.25 mg) in the morning and Take 1 tablet (0.5 mg) at bedtime   Yes [provider]  baclofen (LIORESAL) 10 MG tablet Take 10 mg by mouth 4 (four) times daily.  09/16/19  Yes [provider]  Black Cohosh (REMIFEMIN) 20 MG TABS Take 20 mg by mouth every evening.    Yes [provider]  CALCIUM-VITAMIN D PO Take 1 tablet by mouth daily. 600-800   Yes [provider]  Cyanocobalamin (VITAMIN B-12 PO) Take 500 mcg by mouth daily.   Yes [provider]  dicyclomine (BENTYL) 20 MG tablet Take 20 mg by mouth 4 (four) times daily -  before meals and at bedtime.  11/02/19  Yes [provider]  DULoxetine (CYMBALTA) 20 MG capsule Take 20 mg by mouth every evening.  12/02/19  Yes [provider]  Melatonin 3 MG TABS Take 1 tablet by mouth at bedtime.   Yes [provider]  melatonin 5 MG TABS Take 5 mg by mouth at bedtime.   Yes [provider]  meloxicam (MOBIC) 15 MG tablet Take 15 mg by mouth daily.   Yes [provider]  Multiple Vitamins-Minerals (MULTIVITAMIN ADULTS PO) Take 1 tablet by mouth daily.   Yes [provider]  pregabalin (LYRICA) 50 MG capsule Take 50-100 mg by mouth in the morning, at noon, in the evening, and at bedtime. Take 100 mg (2 capsules) at 10 am, Take 50 mg (1 capsules) at 2 pm, Take 100 mg (2 capsules)  at 6 pm and Take 50 mg (1 capsule) at 10 pm. 12/02/19  Yes [provider]  VITAMIN E PO Take 800 Units by mouth in the morning and at bedtime.    Yes [provider]    Allergies    Allegra [fexofenadine], Clotrim-undecyl-tea tr-lav oil, Doxepin, Keflex [cephalexin], Nitroglycerin, Nitrofuran derivatives, and Sulfa antibiotics  Review of Systems   Review of Systems  Gastrointestinal: Positive for abdominal pain.  All other systems reviewed and are negative.   Physical Exam Updated Vital Signs BP 139/77   Pulse (!) 105   Temp 98.1 F (36.7 C) (Oral)   Resp 15   SpO2 98%   Physical Exam Vitals and nursing note reviewed.  Constitutional:      General: She is not in acute distress.    Appearance: She is well-developed.  HENT:     Head: Normocephalic and atraumatic.  Eyes:     Conjunctiva/sclera: Conjunctivae normal.     Pupils: Pupils are equal, round, and reactive to light.  Cardiovascular:     Rate and Rhythm: Normal rate and regular rhythm.     Heart sounds: Normal heart sounds.  Pulmonary:     Effort: Pulmonary effort is normal. No respiratory distress.     Breath sounds: Normal breath sounds.  Abdominal:     General: There is no distension.     Palpations: Abdomen is soft.     Tenderness: There is generalized abdominal tenderness.  Musculoskeletal:        General: No deformity. Normal range of motion.     Cervical back: Normal range of motion and neck supple.  Skin:    General: Skin is warm and dry.  Neurological:     Mental Status: She is alert and oriented to person, place, and time.     ED Results / Procedures / Treatments   Labs (all labs ordered are listed, but only abnormal results are displayed) Labs Reviewed  COMPREHENSIVE METABOLIC PANEL - Abnormal; Notable for the following components:      Result Value   Glucose, Bld 104 (*)    Creatinine, Ser 0.42 (*)    All other components within normal limits  CBC - Abnormal; Notable for  the following components:   WBC 10.6 (*)    RDW 15.9 (*)    Platelets 436 (*)    All other components within normal limits  URINALYSIS, ROUTINE W REFLEX MICROSCOPIC - Abnormal; Notable for the following components:   Color, Urine STRAW (*)    Specific Gravity, Urine 1.004 (*)    Ketones, ur 20 (*)    All other components within normal limits  SARS CORONAVIRUS 2 BY RT PCR [facility] ORDER, [place] [facility])  LIPASE, BLOOD    EKG EKG Interpretation  Date/Time:  Friday Mar 01 2020  17:07:27 EDT Ventricular Rate:  91 PR Interval:    QRS Duration: 97 QT Interval:  353 QTC Calculation: 435 R Axis:   33 Text Interpretation: Sinus rhythm Confirmed by Dene Gentry [phone]) on 03/01/2020 6:57:46 PM   Radiology CT ABDOMEN PELVIS W CONTRAST  Result Date: 03/01/2020 CLINICAL DATA:  Abdominal distension. Concern for small bowel obstruction. EXAM: CT ABDOMEN AND PELVIS WITH CONTRAST TECHNIQUE: Multidetector CT imaging of the abdomen and pelvis was performed using the standard protocol following bolus administration of intravenous contrast. CONTRAST:  147mL OMNIPAQUE IOHEXOL 300 MG/ML  SOLN COMPARISON:  Radiograph yesterday. CT enterography 11/30/2019 FINDINGS: Lower chest: Chronic atelectasis or scarring in the right middle and lower lobes. Mild hypoventilatory change in the dependent lungs. Normal heart size with coronary artery calcifications. Hepatobiliary: Decreased hepatic density consistent with steatosis. More focal fatty infiltration adjacent to the falciform ligament. No suspicious hepatic lesion. Distended gallbladder without pericholecystic inflammation or calcified gallstone. No biliary dilatation. Pancreas: No ductal dilatation or inflammation. Spleen: Normal in size without focal abnormality. Adrenals/Urinary Tract: Mild left adrenal thickening without dominant nodule. Normal right adrenal gland. No hydronephrosis or perinephric edema. Homogeneous renal enhancement  with symmetric excretion on delayed phase imaging. Parapelvic cyst in the lower right kidney. Urinary bladder is physiologically distended without wall thickening. Stomach/Bowel: Physiologically distended stomach without abnormal distension or gastric wall thickening. Small duodenal diverticulum. Short to moderate length segment of small bowel in the left mid abdomen with wall thickening, enhancement, and perienteric edema. There is associated fecalization of small bowel contents at the distal aspect but no discrete transition point. This inflamed small bowel is mildly dilated measuring up to 3.5 cm. Small bowel is otherwise normal in caliber with scattered air in fluid but no evidence of obstruction. No terminal ileal inflammation. Post appendectomy. Air in stool in the colon without inflammation or wall thickening. Minor diverticulosis of the descending and sigmoid colon without diverticulitis. Vascular/Lymphatic: Moderate aortic atherosclerosis. No aortic aneurysm. Patent portal vein. Patent mesenteric vessels. Scattered central mesenteric nodes, largest measuring 6 mm, likely reactive. No enlarged lymph nodes in the abdomen or pelvis. Reproductive: Post hysterectomy. Ovaries are quiescent. No adnexal mass. Other: Minimal mesenteric edema in the left abdomen in the region of small bowel inflammation. No significant ascites. No free air. No intra-abdominal abscess. Tiny fat containing umbilical hernia. Musculoskeletal: There are no acute or suspicious osseous abnormalities. IMPRESSION: 1. Short to moderate length segment of small bowel in the left mid abdomen with wall thickening, enhancement, and perienteric edema, consistent with enteritis, likely infectious or inflammatory. There is fecalization of small bowel contents mild dilatation involving the inflamed small bowel however no other small bowel dilatation or evidence of obstruction. Overall improved small bowel dilatation since radiograph yesterday. 2. Mild  hepatic steatosis. 3. Minimal colonic diverticulosis without diverticulitis. Aortic Atherosclerosis (ICD10-I70.0). Electronically Signed   By: Keith Rake M.D.   On: 03/01/2020 20:03   DG Abd 2 Views  Result Date: 03/01/2020 CLINICAL DATA:  Abdominal cramping, history of small-bowel obstruction, ALS EXAM: ABDOMEN - 2 VIEW COMPARISON:  01/18/2020 FINDINGS: Scattered normal stool burden throughout colon. Small amount gas within stomach. Markedly dilated small bowel loop in the mid abdomen significantly increased from prior exam with paucity of distal colonic gas consistent with small bowel obstruction. Air-fluid levels on upright view. No bowel wall thickening or free air. Osseous structures unremarkable. Minimal bibasilar atelectasis. No urinary tract calcification. IMPRESSION: Small-bowel obstruction at proximal to mid small bowel. These results will be called to the ordering  clinician or representative by the Radiologist Assistant, and communication documented in the PACS or [employer]. Electronically Signed   By: Lavonia Dana M.D.   On: 03/01/2020 10:40    Procedures Procedures (including critical care time)  Medications Ordered in ED Medications  sodium chloride (PF) 0.9 % injection (has no administration in time range)  0.9 %  sodium chloride infusion ( Intravenous New Bag/Given (Non-Interop) 03/01/20 1737)  iohexol (OMNIPAQUE) 300 MG/ML solution 100 mL (100 mLs Intravenous Contrast Given 03/01/20 1915)    ED Course  I have reviewed the triage vital signs and the nursing notes.  Pertinent labs & imaging results that were available during my care of the patient were reviewed by me and considered in my medical decision making (see chart for details).    MDM Rules/Calculators/A&P                      MDM  Screen complete  SHYREE PIECZYNSKI was evaluated in Emergency Department on 03/01/2020 for the symptoms described in the history of present illness. She was evaluated in the  context of the global COVID-19 pandemic, which necessitated consideration that the patient might be at risk for infection with the SARS-CoV-2 virus that causes COVID-19. Institutional protocols and algorithms that pertain to the evaluation of patients at risk for COVID-19 are in a state of rapid change based on information released by regulatory bodies including the CDC and federal and state organizations. These policies and algorithms were followed during the patient's care in the ED.   Patient is presenting for evaluation of abdominal discomfort consistent with prior episode of SBO.  Imaging obtained this evening suggest possible area of enteritis with fecalization of the small bowel.   Case discussed with Dr. Barry Dienes of surgery.  She will see the patient in the ED.  Hospitalist service is aware of case and will evaluate the patient for admission.   Final Clinical Impression(s) / ED Diagnoses Final diagnoses:  Generalized abdominal pain    Rx / DC Orders ED Discharge Orders    None       Valarie Merino, MD 03/01/20 2031

## 2020-03-01 NOTE — ED Notes (Signed)
Huddle with Psychologist, clinical. [facility] Huddle form filled out.

## 2020-03-01 NOTE — Consult Note (Signed)
Reason for Consult: abdominal pain Referring Physician: Francia Greaves, MD   Lori Alvarez is an 59 y.o. female.  HPI:  Pt is a 59 yo F with severe cramping abdominal pain for 3-4 days.  She had similar symptoms in February and was admitted for around 3 days.  At that time, her bowel function resolved quickly and her xrays progressed rapidly.  She did not require surgery.  She has been at home since then.  Around 3 days ago she started having severe abdominal pain, "like a knife stabbing into me."  She normally has a bowel movement every 3 days or so.  However, today, she had a firm stool, then around 45 minutes of cramping pain associated with multiple episodes of diarrhea.  She denies fever/chills.  She has a history of collagenous colitis.  She has seen [facility] and has tried dicyclomine for the pain which did not work.  She is very frustrated.  She has not had nausea or vomiting.  Plain films earlier today showed air fluid levels and dilated SB loop.    She has had an appendectomy and partial hysterectomy (open).    Past Medical History:  Diagnosis Date  . ALS (amyotrophic lateral sclerosis) ([place])   . Postherpetic neuralgia     Past Surgical History:  Procedure Laterality Date  . APPENDECTOMY    . PARTIAL HYSTERECTOMY      Family History  Problem Relation Age of Onset  . Myopathy Mother   . Cancer Father        renal    Social History:  reports that she has never smoked. She has never used smokeless tobacco. She reports that she does not drink alcohol or use drugs.  Allergies:  Allergies  Allergen Reactions  . Allegra [Fexofenadine]     Ringing in ears   . Clotrim-Undecyl-Tea Tr-Lav Oil     unknown  . Doxepin     Headaches  . Keflex [Cephalexin]     "too strong, made me drowsy and dizzy"  . Nitroglycerin     Migraine  . Nitrofuran Derivatives Rash  . Sulfa Antibiotics Rash    Medications:  Current Meds  Medication Sig  . acetaminophen (TYLENOL) 500 MG tablet Take  500 mg by mouth in the morning, at noon, in the evening, and at bedtime.  . ALPRAZolam (XANAX) 0.5 MG tablet Take 0.25-0.5 mg by mouth 2 (two) times daily. Take 1/2 tablet (0.25 mg) in the morning and Take 1 tablet (0.5 mg) at bedtime  . baclofen (LIORESAL) 10 MG tablet Take 10 mg by mouth 4 (four) times daily.   . Black Cohosh (REMIFEMIN) 20 MG TABS Take 20 mg by mouth every evening.   Marland Kitchen CALCIUM-VITAMIN D PO Take 1 tablet by mouth daily. 600-800  . Cyanocobalamin (VITAMIN B-12 PO) Take 500 mcg by mouth daily.  Marland Kitchen dicyclomine (BENTYL) 20 MG tablet Take 20 mg by mouth 4 (four) times daily -  before meals and at bedtime.   . DULoxetine (CYMBALTA) 20 MG capsule Take 20 mg by mouth every evening.   . Melatonin 3 MG TABS Take 1 tablet by mouth at bedtime.  . melatonin 5 MG TABS Take 5 mg by mouth at bedtime.  . meloxicam (MOBIC) 15 MG tablet Take 15 mg by mouth daily.  . Multiple Vitamins-Minerals (MULTIVITAMIN ADULTS PO) Take 1 tablet by mouth daily.  . pregabalin (LYRICA) 50 MG capsule Take 50-100 mg by mouth in the morning, at noon, in the evening, and  at bedtime. Take 100 mg (2 capsules) at 10 am, Take 50 mg (1 capsules) at 2 pm, Take 100 mg (2 capsules) at 6 pm and Take 50 mg (1 capsule) at 10 pm.  . VITAMIN E PO Take 800 Units by mouth in the morning and at bedtime.      Results for orders placed or performed during the hospital encounter of 03/01/20 (from the past 48 hour(s))  Urinalysis, Routine w reflex microscopic     Status: Abnormal   Collection Time: 03/01/20  4:19 PM  Result Value Ref Range   Color, Urine STRAW (A) YELLOW   APPearance CLEAR CLEAR   Specific Gravity, Urine 1.004 (L) 1.005 - 1.030   pH 6.0 5.0 - 8.0   Glucose, UA NEGATIVE NEGATIVE mg/dL   Hgb urine dipstick NEGATIVE NEGATIVE   Bilirubin Urine NEGATIVE NEGATIVE   Ketones, ur 20 (A) NEGATIVE mg/dL   Protein, ur NEGATIVE NEGATIVE mg/dL   Nitrite NEGATIVE NEGATIVE   Leukocytes,Ua NEGATIVE NEGATIVE    Comment:  Performed at [facility] [address], [place], [place] [address]  Lipase, blood     Status: None   Collection Time: 03/01/20  5:33 PM  Result Value Ref Range   Lipase 22 11 - 51 U/L    Comment: Performed at [facility], [place] [address], [place], [place] [address]  Comprehensive metabolic panel     Status: Abnormal   Collection Time: 03/01/20  5:33 PM  Result Value Ref Range   Sodium 135 135 - 145 mmol/L   Potassium 4.6 3.5 - 5.1 mmol/L   Chloride 100 98 - 111 mmol/L   CO2 22 22 - 32 mmol/L   Glucose, Bld 104 (H) 70 - 99 mg/dL    Comment: Glucose reference range applies only to samples taken after fasting for at least 8 hours.   BUN 10 6 - 20 mg/dL   Creatinine, Ser 0.42 (L) 0.44 - 1.00 mg/dL   Calcium 9.1 8.9 - 10.3 mg/dL   Total Protein 7.4 6.5 - 8.1 g/dL   Albumin 4.1 3.5 - 5.0 g/dL   AST 23 15 - 41 U/L   ALT 24 0 - 44 U/L   Alkaline Phosphatase 106 38 - 126 U/L   Total Bilirubin 0.7 0.3 - 1.2 mg/dL   GFR calc non Af Amer >60 >60 mL/min   GFR calc Af Amer >60 >60 mL/min   Anion gap 13 5 - 15    Comment: Performed at [facility], [place] [address], [place], [place] [address]  CBC     Status: Abnormal   Collection Time: 03/01/20  5:33 PM  Result Value Ref Range   WBC 10.6 (H) 4.0 - 10.5 K/uL   RBC 4.76 3.87 - 5.11 MIL/uL   Hemoglobin 13.8 12.0 - 15.0 g/dL   HCT 42.3 36.0 - 46.0 %   MCV 88.9 80.0 - 100.0 fL   MCH 29.0 26.0 - 34.0 pg   MCHC 32.6 30.0 - 36.0 g/dL   RDW 15.9 (H) 11.5 - 15.5 %   Platelets 436 (H) 150 - 400 K/uL   nRBC 0.0 0.0 - 0.2 %    Comment: Performed at [facility], [place] [address], [place], [place] [address]  SARS Coronavirus 2 by RT PCR (hospital order, performed in [facility] hospital lab) Nasopharyngeal Nasopharyngeal Swab     Status: None   Collection Time: 03/01/20  5:33 PM   Specimen: Nasopharyngeal Swab  Result Value Ref Range   SARS Coronavirus 2 NEGATIVE NEGATIVE     Comment: (NOTE) SARS-CoV-2 target nucleic acids are NOT DETECTED. The SARS-CoV-2 RNA is generally detectable in upper and lower respiratory specimens during the acute phase of infection. The lowest concentration of SARS-CoV-2 viral copies this assay can detect is 250 copies / mL. A negative result does not preclude SARS-CoV-2 infection and should not be used as the sole basis for treatment or other patient management decisions.  A negative result may occur with improper specimen collection / handling, submission of specimen other than nasopharyngeal swab, presence of viral mutation(s) within the areas targeted by this assay, and inadequate number of viral copies (<250 copies / mL). A negative result must be combined with clinical observations, patient history, and epidemiological information. Fact Sheet for Patients:   StrictlyIdeas.no Fact Sheet for Healthcare Providers: BankingDealers.co.za This test is not yet approved or cleared  by the [place] FDA and has been authorized for detection and/or diagnosis of SARS-CoV-2 by FDA under an Emergency Use Authorization (EUA).  This EUA will remain in effect (meaning this test can be used) for the duration of the COVID-19 declaration under Section 564(b)(1) of the Act, 21 U.S.C. section 360bbb-3(b)(1), unless the authorization is terminated or revoked sooner. Performed at [facility], [place] [address] [place],  [address]     CT ABDOMEN PELVIS W CONTRAST  Result Date: 03/01/2020 CLINICAL DATA:  Abdominal distension. Concern for small bowel obstruction. EXAM: CT ABDOMEN AND PELVIS WITH CONTRAST TECHNIQUE: Multidetector CT imaging of the abdomen and pelvis was performed using the standard protocol following bolus administration of intravenous contrast. CONTRAST:  164mL OMNIPAQUE IOHEXOL 300 MG/ML  SOLN COMPARISON:  Radiograph yesterday. CT enterography 11/30/2019  FINDINGS: Lower chest: Chronic atelectasis or scarring in the right middle and lower lobes. Mild hypoventilatory change in the dependent lungs. Normal heart size with coronary artery calcifications. Hepatobiliary: Decreased hepatic density consistent with steatosis. More focal fatty infiltration adjacent to the falciform ligament. No suspicious hepatic lesion. Distended gallbladder without pericholecystic inflammation or calcified gallstone. No biliary dilatation. Pancreas: No ductal dilatation or inflammation. Spleen: Normal in size without focal abnormality. Adrenals/Urinary Tract: Mild left adrenal thickening without dominant nodule. Normal right adrenal gland. No hydronephrosis or perinephric edema. Homogeneous renal enhancement with symmetric excretion on delayed phase imaging. Parapelvic cyst in the lower right kidney. Urinary bladder is physiologically distended without wall thickening. Stomach/Bowel: Physiologically distended stomach without abnormal distension or gastric wall thickening. Small duodenal diverticulum. Short to moderate length segment of small bowel in the left mid abdomen with wall thickening, enhancement, and perienteric edema. There is associated fecalization of small bowel contents at the distal aspect but no discrete transition point. This inflamed small bowel is mildly dilated measuring up to 3.5 cm. Small bowel is otherwise normal in caliber with scattered air in fluid but no evidence of obstruction. No terminal ileal inflammation. Post appendectomy. Air in stool in the colon without inflammation or wall thickening. Minor diverticulosis of the descending and sigmoid colon without diverticulitis. Vascular/Lymphatic: Moderate aortic atherosclerosis. No aortic aneurysm. Patent portal vein. Patent mesenteric vessels. Scattered central mesenteric nodes, largest measuring 6 mm, likely reactive. No enlarged lymph nodes in the abdomen or pelvis. Reproductive: Post hysterectomy. Ovaries are  quiescent. No adnexal mass. Other: Minimal mesenteric edema in the left abdomen in the region of small bowel inflammation. No significant ascites. No free air. No intra-abdominal abscess. Tiny fat containing umbilical hernia. Musculoskeletal: There are no acute or suspicious  osseous abnormalities. IMPRESSION: 1. Short to moderate length segment of small bowel in the left mid abdomen with wall thickening, enhancement, and perienteric edema, consistent with enteritis, likely infectious or inflammatory. There is fecalization of small bowel contents mild dilatation involving the inflamed small bowel however no other small bowel dilatation or evidence of obstruction. Overall improved small bowel dilatation since radiograph yesterday. 2. Mild hepatic steatosis. 3. Minimal colonic diverticulosis without diverticulitis. Aortic Atherosclerosis (ICD10-I70.0). Electronically Signed   By: Keith Rake M.D.   On: 03/01/2020 20:03   DG Abd 2 Views  Result Date: 03/01/2020 CLINICAL DATA:  Abdominal cramping, history of small-bowel obstruction, ALS EXAM: ABDOMEN - 2 VIEW COMPARISON:  01/18/2020 FINDINGS: Scattered normal stool burden throughout colon. Small amount gas within stomach. Markedly dilated small bowel loop in the mid abdomen significantly increased from prior exam with paucity of distal colonic gas consistent with small bowel obstruction. Air-fluid levels on upright view. No bowel wall thickening or free air. Osseous structures unremarkable. Minimal bibasilar atelectasis. No urinary tract calcification. IMPRESSION: Small-bowel obstruction at proximal to mid small bowel. These results will be called to the ordering clinician or representative by the Radiologist Assistant, and communication documented in the PACS or [employer]. Electronically Signed   By: Lavonia Dana M.D.   On: 03/01/2020 10:40    Review of Systems  Constitutional: Positive for fatigue.  HENT: Negative.   Eyes: Negative.   Respiratory:  Negative.   Cardiovascular: Negative.   Gastrointestinal: Positive for abdominal pain, constipation and diarrhea. Negative for vomiting.  Endocrine: Negative.   Genitourinary: Negative.   Musculoskeletal: Negative.   Allergic/Immunologic: Negative.   Neurological:       ALS with weak arms    Hematological: Negative.   Psychiatric/Behavioral: Negative.    Blood pressure (!) 142/77, pulse (!) 112, temperature 98.1 F (36.7 C), temperature source Oral, resp. rate 17, SpO2 97 %. Physical Exam  Constitutional: She is oriented to person, place, and time. She appears well-developed and well-nourished. She appears distressed (looks flushed and uncomfortable).  HENT:  Head: Normocephalic and atraumatic.  Right Ear: External ear normal.  Left Ear: External ear normal.  Nose: Nose normal.  Mouth/Throat: Oropharynx is clear and moist.  Eyes: Pupils are equal, round, and reactive to light. Conjunctivae are normal. Right eye exhibits no discharge. Left eye exhibits no discharge. No scleral icterus.  Neck: No tracheal deviation present. No thyromegaly present.  Cardiovascular: Regular rhythm, normal heart sounds and intact distal pulses. Exam reveals no gallop.  No murmur heard. Slightly tachycardic   Respiratory: Effort normal and breath sounds normal. No respiratory distress. She has no wheezes. She has no rales.  GI: Soft. She exhibits distension (mildly distended). She exhibits no mass. There is no abdominal tenderness. There is no rebound and no guarding.  Musculoskeletal:        General: No tenderness, deformity or edema.     Cervical back: Normal range of motion and neck supple.  Lymphadenopathy:    She has no cervical adenopathy.  Neurological: She is alert and oriented to person, place, and time. Coordination normal.  Skin: Skin is warm and dry. No rash noted. She is not diaphoretic. No erythema. No pallor.  Psychiatric: She has a normal mood and affect. Her behavior is normal.  Judgment and thought content normal.    Assessment/Plan: Enteritis.  ALS I don't think this represents an SBO despite films yesterday.  There is a loop of inflamed bowel that could be the source of her  symptoms.  She is having quite severe pain intermittently.  She needs symptom control and rehydration as she has not really been able to eat for several days.    Alternatively, she could be developing an intermittent twist or internal hernia of the loop of small bowel.   Would treat with bowel rest, antibiotics, and rehydration.    Stark Klein 03/01/2020, 9:58 PM

## 2020-03-01 NOTE — ED Notes (Signed)
PT currently in CT.

## 2020-03-01 NOTE — ED Triage Notes (Signed)
Patient reports seen by gastroenterologist yesterday and sent for further evaluation after abdominal XR showed bowel obstruction. C/o abdominal cramping. Denies vomiting. Hx of the same. Sister at bedside with patient. Hx ALS.

## 2020-03-01 NOTE — ED Notes (Signed)
This RN was informed by ANN NT that while patient was in restroom with her sister pt fell on her buttocks while bending over to pick up toilet paper that fell on the floor. Pt has ALS and forgets her limitations. Pt denies any pains injuries at this time. Denies hitting her head or any LOC. Sister was with patient the entire time and helped assisted patient up.

## 2020-03-01 NOTE — ED Notes (Signed)
Messaged [facility] making aware of fall. No new orders at this time.  Safety Zone completed.

## 2020-03-01 NOTE — H&P (Signed)
History and Physical    AJAE STFLEUR [id number] DOB: Mar 13, 1961 DOA: 03/01/2020  PCP: Shirline Frees, MD  Patient coming from: Home.  Chief Complaint: Abdominal pain.  HPI: Lori Alvarez is a 59 y.o. female with history of ALS who was admitted in February 2 months ago for small bowel obstruction presents to the ER with complaint of worsening abdominal pain over the last few days.  Patient states since her last discharge patient has been having abdominal pain off and on.  But over the last few days it has worsened.  Patient started having diarrhea after normal bowel movement yesterday.  Had multiple episodes within an hour.  Denies any fever chills or nausea vomiting.  Pain is mostly mid abdomen stabbing in nature.  At times severe and has no relation to food.  ED Course: In the ER CT abdomen pelvis shows features concerning for inflammatory changes in the 1 section of the small bowel on the left midabdomen.  General surgery was consulted.  General surgery recommended bowel rest IV fluids and antibiotics.  Labs showing WBC of 10.6 otherwise largely unremarkable.  Covid test is negative.  EKG shows normal sinus rhythm.  Review of Systems: As per HPI, rest all negative.   Past Medical History:  Diagnosis Date  . ALS (amyotrophic lateral sclerosis) ([place])   . Postherpetic neuralgia     Past Surgical History:  Procedure Laterality Date  . APPENDECTOMY    . PARTIAL HYSTERECTOMY       reports that she has never smoked. She has never used smokeless tobacco. She reports that she does not drink alcohol or use drugs.  Allergies  Allergen Reactions  . Allegra [Fexofenadine]     Ringing in ears   . Clotrim-Undecyl-Tea Tr-Lav Oil     unknown  . Doxepin     Headaches  . Keflex [Cephalexin]     "too strong, made me drowsy and dizzy"  . Nitroglycerin     Migraine  . Nitrofuran Derivatives Rash  . Sulfa Antibiotics Rash    Family History  Problem Relation Age of Onset  .  Myopathy Mother   . Cancer Father        renal    Prior to Admission medications   Medication Sig Start Date End Date Taking? Authorizing Provider  acetaminophen (TYLENOL) 500 MG tablet Take 500 mg by mouth in the morning, at noon, in the evening, and at bedtime.   Yes [provider]  ALPRAZolam (XANAX) 0.5 MG tablet Take 0.25-0.5 mg by mouth 2 (two) times daily. Take 1/2 tablet (0.25 mg) in the morning and Take 1 tablet (0.5 mg) at bedtime   Yes [provider]  baclofen (LIORESAL) 10 MG tablet Take 10 mg by mouth 4 (four) times daily.  09/16/19  Yes [provider]  Black Cohosh (REMIFEMIN) 20 MG TABS Take 20 mg by mouth every evening.    Yes [provider]  CALCIUM-VITAMIN D PO Take 1 tablet by mouth daily. 600-800   Yes [provider]  Cyanocobalamin (VITAMIN B-12 PO) Take 500 mcg by mouth daily.   Yes [provider]  dicyclomine (BENTYL) 20 MG tablet Take 20 mg by mouth 4 (four) times daily -  before meals and at bedtime.  11/02/19  Yes [provider]  DULoxetine (CYMBALTA) 20 MG capsule Take 20 mg by mouth every evening.  12/02/19  Yes [provider]  Melatonin 3 MG TABS Take 1 tablet by mouth at bedtime.  Yes [provider]  melatonin 5 MG TABS Take 5 mg by mouth at bedtime.   Yes [provider]  meloxicam (MOBIC) 15 MG tablet Take 15 mg by mouth daily.   Yes [provider]  Multiple Vitamins-Minerals (MULTIVITAMIN ADULTS PO) Take 1 tablet by mouth daily.   Yes [provider]  pregabalin (LYRICA) 50 MG capsule Take 50-100 mg by mouth in the morning, at noon, in the evening, and at bedtime. Take 100 mg (2 capsules) at 10 am, Take 50 mg (1 capsules) at 2 pm, Take 100 mg (2 capsules) at 6 pm and Take 50 mg (1 capsule) at 10 pm. 12/02/19  Yes [provider]  VITAMIN E PO Take 800 Units by mouth in the morning and at bedtime.    Yes [provider]     Physical Exam: Constitutional: Moderately built and nourished. Vitals:   03/01/20 2030 03/01/20 2100 03/01/20 2130 03/01/20 2200  BP: (!) 149/85 (!) 152/89 (!) 142/77 (!) 149/82  Pulse: (!) 106 (!) 112  (!) 110  Resp: 16 17 17 17   Temp:      TempSrc:      SpO2: 98% 97% 97% 98%   Eyes: Anicteric no pallor. ENMT: No discharge from the ears eyes nose or mouth. Neck: No mass felt.  No neck rigidity. Respiratory: No rhonchi or crepitations. Cardiovascular: S1-S2 heard. Abdomen: Soft nontender bowel sounds present. Musculoskeletal: No edema. Skin: No rash. Neurologic: Alert awake oriented time place and person.  Moves all extremities. Psychiatric: Appears normal.   Labs on Admission: I have personally reviewed following labs and imaging studies  CBC: Recent Labs  Lab 03/01/20 1733  WBC 10.6*  HGB 13.8  HCT 42.3  MCV 88.9  PLT [id number]*   Basic Metabolic Panel: Recent Labs  Lab 03/01/20 1733  NA 135  K 4.6  CL 100  CO2 22  GLUCOSE 104*  BUN 10  CREATININE 0.42*  CALCIUM 9.1   GFR: CrCl cannot be calculated (Unknown ideal weight.). Liver Function Tests: Recent Labs  Lab 03/01/20 1733  AST 23  ALT 24  ALKPHOS 106  BILITOT 0.7  PROT 7.4  ALBUMIN 4.1   Recent Labs  Lab 03/01/20 1733  LIPASE 22   No results for input(s): AMMONIA in the last 168 hours. Coagulation Profile: No results for input(s): INR, PROTIME in the last 168 hours. Cardiac Enzymes: No results for input(s): CKTOTAL, CKMB, CKMBINDEX, TROPONINI in the last 168 hours. BNP (last 3 results) No results for input(s): PROBNP in the last 8760 hours. HbA1C: No results for input(s): HGBA1C in the last 72 hours. CBG: No results for input(s): GLUCAP in the last 168 hours. Lipid Profile: No results for input(s): CHOL, HDL, LDLCALC, TRIG, CHOLHDL, LDLDIRECT in the last 72 hours. Thyroid Function Tests: No results for input(s): TSH, T4TOTAL, FREET4, T3FREE, THYROIDAB in the last 72 hours. Anemia  Panel: No results for input(s): VITAMINB12, FOLATE, FERRITIN, TIBC, IRON, RETICCTPCT in the last 72 hours. Urine analysis:    Component Value Date/Time   COLORURINE STRAW (A) 03/01/2020 1619   APPEARANCEUR CLEAR 03/01/2020 1619   LABSPEC 1.004 (L) 03/01/2020 1619   PHURINE 6.0 03/01/2020 1619   GLUCOSEU NEGATIVE 03/01/2020 1619   HGBUR NEGATIVE 03/01/2020 1619   BILIRUBINUR NEGATIVE 03/01/2020 1619   KETONESUR 20 (A) 03/01/2020 1619   PROTEINUR NEGATIVE 03/01/2020 1619   NITRITE NEGATIVE 03/01/2020 1619   LEUKOCYTESUR NEGATIVE 03/01/2020 1619   Sepsis Labs: @LABRCNTIP (procalcitonin:4,lacticidven:4) ) Recent Results (from the  past 240 hour(s))  SARS Coronavirus 2 by RT PCR (hospital order, performed in [facility] hospital lab) Nasopharyngeal Nasopharyngeal Swab     Status: None   Collection Time: 03/01/20  5:33 PM   Specimen: Nasopharyngeal Swab  Result Value Ref Range Status   SARS Coronavirus 2 NEGATIVE NEGATIVE Final    Comment: (NOTE) SARS-CoV-2 target nucleic acids are NOT DETECTED. The SARS-CoV-2 RNA is generally detectable in upper and lower respiratory specimens during the acute phase of infection. The lowest concentration of SARS-CoV-2 viral copies this assay can detect is 250 copies / mL. A negative result does not preclude SARS-CoV-2 infection and should not be used as the sole basis for treatment or other patient management decisions.  A negative result may occur with improper specimen collection / handling, submission of specimen other than nasopharyngeal swab, presence of viral mutation(s) within the areas targeted by this assay, and inadequate number of viral copies (<250 copies / mL). A negative result must be combined with clinical observations, patient history, and epidemiological information. Fact Sheet for Patients:   StrictlyIdeas.no Fact Sheet for Healthcare Providers: BankingDealers.co.za This test is not  yet approved or cleared  by the [place] FDA and has been authorized for detection and/or diagnosis of SARS-CoV-2 by FDA under an Emergency Use Authorization (EUA).  This EUA will remain in effect (meaning this test can be used) for the duration of the COVID-19 declaration under Section 564(b)(1) of the Act, 21 U.S.C. section 360bbb-3(b)(1), unless the authorization is terminated or revoked sooner. Performed at [facility], [place] [address], [place], [place] [address]      Radiological Exams on Admission: CT ABDOMEN PELVIS W CONTRAST  Result Date: 03/01/2020 CLINICAL DATA:  Abdominal distension. Concern for small bowel obstruction. EXAM: CT ABDOMEN AND PELVIS WITH CONTRAST TECHNIQUE: Multidetector CT imaging of the abdomen and pelvis was performed using the standard protocol following bolus administration of intravenous contrast. CONTRAST:  173mL OMNIPAQUE IOHEXOL 300 MG/ML  SOLN COMPARISON:  Radiograph yesterday. CT enterography 11/30/2019 FINDINGS: Lower chest: Chronic atelectasis or scarring in the right middle and lower lobes. Mild hypoventilatory change in the dependent lungs. Normal heart size with coronary artery calcifications. Hepatobiliary: Decreased hepatic density consistent with steatosis. More focal fatty infiltration adjacent to the falciform ligament. No suspicious hepatic lesion. Distended gallbladder without pericholecystic inflammation or calcified gallstone. No biliary dilatation. Pancreas: No ductal dilatation or inflammation. Spleen: Normal in size without focal abnormality. Adrenals/Urinary Tract: Mild left adrenal thickening without dominant nodule. Normal right adrenal gland. No hydronephrosis or perinephric edema. Homogeneous renal enhancement with symmetric excretion on delayed phase imaging. Parapelvic cyst in the lower right kidney. Urinary bladder is physiologically distended without wall thickening. Stomach/Bowel: Physiologically distended stomach  without abnormal distension or gastric wall thickening. Small duodenal diverticulum. Short to moderate length segment of small bowel in the left mid abdomen with wall thickening, enhancement, and perienteric edema. There is associated fecalization of small bowel contents at the distal aspect but no discrete transition point. This inflamed small bowel is mildly dilated measuring up to 3.5 cm. Small bowel is otherwise normal in caliber with scattered air in fluid but no evidence of obstruction. No terminal ileal inflammation. Post appendectomy. Air in stool in the colon without inflammation or wall thickening. Minor diverticulosis of the descending and sigmoid colon without diverticulitis. Vascular/Lymphatic: Moderate aortic atherosclerosis. No aortic aneurysm. Patent portal vein. Patent mesenteric vessels. Scattered central mesenteric nodes, largest measuring 6 mm, likely reactive. No enlarged lymph nodes in the abdomen  or pelvis. Reproductive: Post hysterectomy. Ovaries are quiescent. No adnexal mass. Other: Minimal mesenteric edema in the left abdomen in the region of small bowel inflammation. No significant ascites. No free air. No intra-abdominal abscess. Tiny fat containing umbilical hernia. Musculoskeletal: There are no acute or suspicious osseous abnormalities. IMPRESSION: 1. Short to moderate length segment of small bowel in the left mid abdomen with wall thickening, enhancement, and perienteric edema, consistent with enteritis, likely infectious or inflammatory. There is fecalization of small bowel contents mild dilatation involving the inflamed small bowel however no other small bowel dilatation or evidence of obstruction. Overall improved small bowel dilatation since radiograph yesterday. 2. Mild hepatic steatosis. 3. Minimal colonic diverticulosis without diverticulitis. Aortic Atherosclerosis (ICD10-I70.0). Electronically Signed   By: Keith Rake M.D.   On: 03/01/2020 20:03   DG Abd 2  Views  Result Date: 03/01/2020 CLINICAL DATA:  Abdominal cramping, history of small-bowel obstruction, ALS EXAM: ABDOMEN - 2 VIEW COMPARISON:  01/18/2020 FINDINGS: Scattered normal stool burden throughout colon. Small amount gas within stomach. Markedly dilated small bowel loop in the mid abdomen significantly increased from prior exam with paucity of distal colonic gas consistent with small bowel obstruction. Air-fluid levels on upright view. No bowel wall thickening or free air. Osseous structures unremarkable. Minimal bibasilar atelectasis. No urinary tract calcification. IMPRESSION: Small-bowel obstruction at proximal to mid small bowel. These results will be called to the ordering clinician or representative by the Radiologist Assistant, and communication documented in the PACS or [employer]. Electronically Signed   By: Lavonia Dana M.D.   On: 03/01/2020 10:40    EKG: Independently reviewed.  Normal sinus rhythm.  Assessment/Plan Principal Problem:   SBO (small bowel obstruction) ([facility]) Active Problems:   ALS (amyotrophic lateral sclerosis) ([facility])   MDS (myelodysplastic syndrome) ([facility])   Abdominal pain    1. Abdominal pain with diarrhea -appreciate general surgery consult has recommended to keep patient having bowel rest antibiotics and hydration.  We will repeat labs and x-rays in the morning.  Pain medications. 2. History of ALS. 3. History of postherpetic neuralgia.  Given the severe abdominal pain with clinically appearing dehydrated patient will need more than 2 midnight stay in inpatient status.   DVT prophylaxis: SCDs for now will avoid pharmacological DVT prophylaxis in anticipation of possible procedure. Code Status: DNR. Family Communication: Family at the bedside. Disposition Plan: Home. Consults called: General surgery. Admission status: Inpatient   Rise Patience MD [facility] Pager [phone].  If 7PM-7AM, please contact  night-coverage [email or web address] Password [facility]  03/01/2020, 10:31 PM

## 2020-03-02 ENCOUNTER — Other Ambulatory Visit: Payer: Self-pay

## 2020-03-02 DIAGNOSIS — K56609 Unspecified intestinal obstruction, unspecified as to partial versus complete obstruction: Principal | ICD-10-CM

## 2020-03-02 LAB — CBC WITH DIFFERENTIAL/PLATELET
Abs Immature Granulocytes: 0.02 10*3/uL (ref 0.00–0.07)
Basophils Absolute: 0 10*3/uL (ref 0.0–0.1)
Basophils Relative: 0 %
Eosinophils Absolute: 0 10*3/uL (ref 0.0–0.5)
Eosinophils Relative: 0 %
HCT: 39 % (ref 36.0–46.0)
Hemoglobin: 12.6 g/dL (ref 12.0–15.0)
Immature Granulocytes: 0 %
Lymphocytes Relative: 22 %
Lymphs Abs: 1.8 10*3/uL (ref 0.7–4.0)
MCH: 28.8 pg (ref 26.0–34.0)
MCHC: 32.3 g/dL (ref 30.0–36.0)
MCV: 89.2 fL (ref 80.0–100.0)
Monocytes Absolute: 0.6 10*3/uL (ref 0.1–1.0)
Monocytes Relative: 8 %
Neutro Abs: 5.7 10*3/uL (ref 1.7–7.7)
Neutrophils Relative %: 70 %
Platelets: 420 10*3/uL — ABNORMAL HIGH (ref 150–400)
RBC: 4.37 MIL/uL (ref 3.87–5.11)
RDW: 15.9 % — ABNORMAL HIGH (ref 11.5–15.5)
WBC: 8.1 10*3/uL (ref 4.0–10.5)
nRBC: 0 % (ref 0.0–0.2)

## 2020-03-02 LAB — COMPREHENSIVE METABOLIC PANEL
ALT: 23 U/L (ref 0–44)
AST: 13 U/L — ABNORMAL LOW (ref 15–41)
Albumin: 3.8 g/dL (ref 3.5–5.0)
Alkaline Phosphatase: 99 U/L (ref 38–126)
Anion gap: 10 (ref 5–15)
BUN: 6 mg/dL (ref 6–20)
CO2: 21 mmol/L — ABNORMAL LOW (ref 22–32)
Calcium: 8.9 mg/dL (ref 8.9–10.3)
Chloride: 107 mmol/L (ref 98–111)
Creatinine, Ser: 0.38 mg/dL — ABNORMAL LOW (ref 0.44–1.00)
GFR calc Af Amer: >60 mL/min (ref >60)
GFR calc non Af Amer: >60 mL/min (ref >60)
Glucose, Bld: 145 mg/dL — ABNORMAL HIGH (ref 70–99)
Potassium: 3.4 mmol/L — ABNORMAL LOW (ref 3.5–5.1)
Sodium: 138 mmol/L (ref 135–145)
Total Bilirubin: 0.8 mg/dL (ref 0.3–1.2)
Total Protein: 6.8 g/dL (ref 6.5–8.1)

## 2020-03-02 LAB — HIV ANTIBODY (ROUTINE TESTING W REFLEX): HIV Screen 4th Generation wRfx: NONREACTIVE

## 2020-03-02 LAB — CBG MONITORING, ED
Glucose-Capillary: 130 mg/dL — ABNORMAL HIGH (ref 70–99)
Glucose-Capillary: 92 mg/dL (ref 70–99)

## 2020-03-02 LAB — GLUCOSE, CAPILLARY: Glucose-Capillary: 115 mg/dL — ABNORMAL HIGH (ref 70–99)

## 2020-03-02 MED ORDER — PREGABALIN 100 MG PO CAPS
100.0000 mg | ORAL_CAPSULE | Freq: Two times a day (BID) | ORAL | Status: DC
  Administered 2020-03-02 – 2020-03-03 (×2): 100 mg via ORAL
  Filled 2020-03-02 (×2): qty 2

## 2020-03-02 MED ORDER — ALPRAZOLAM 0.25 MG PO TABS
0.2500 mg | ORAL_TABLET | Freq: Two times a day (BID) | ORAL | Status: DC
  Filled 2020-03-02: qty 2

## 2020-03-02 MED ORDER — ALPRAZOLAM 0.25 MG PO TABS
0.2500 mg | ORAL_TABLET | Freq: Every day | ORAL | Status: DC
  Administered 2020-03-03: 0.25 mg via ORAL
  Filled 2020-03-02: qty 1

## 2020-03-02 MED ORDER — DULOXETINE HCL 20 MG PO CPEP
20.0000 mg | ORAL_CAPSULE | Freq: Every evening | ORAL | Status: DC
  Administered 2020-03-02: 20 mg via ORAL
  Filled 2020-03-02 (×3): qty 1

## 2020-03-02 MED ORDER — PREGABALIN 50 MG PO CAPS
50.0000 mg | ORAL_CAPSULE | Freq: Two times a day (BID) | ORAL | Status: DC
  Administered 2020-03-02: 50 mg via ORAL
  Filled 2020-03-02 (×2): qty 1

## 2020-03-02 MED ORDER — PIPERACILLIN-TAZOBACTAM 3.375 G IVPB 30 MIN
3.3750 g | Freq: Once | INTRAVENOUS | Status: AC
  Administered 2020-03-02: 3.375 g via INTRAVENOUS
  Filled 2020-03-02: qty 50

## 2020-03-02 MED ORDER — PIPERACILLIN-TAZOBACTAM 3.375 G IVPB: 3.3750 g | Freq: Once | INTRAVENOUS | Status: DC

## 2020-03-02 MED ORDER — ALPRAZOLAM 0.5 MG PO TABS
0.5000 mg | ORAL_TABLET | Freq: Every day | ORAL | Status: DC
  Administered 2020-03-02: 0.5 mg via ORAL

## 2020-03-02 MED ORDER — BACLOFEN 10 MG PO TABS
10.0000 mg | ORAL_TABLET | Freq: Four times a day (QID) | ORAL | Status: DC
  Administered 2020-03-02 – 2020-03-03 (×3): 10 mg via ORAL
  Filled 2020-03-02 (×4): qty 1

## 2020-03-02 MED ORDER — PIPERACILLIN-TAZOBACTAM 3.375 G IVPB
3.3750 g | Freq: Three times a day (TID) | INTRAVENOUS | Status: DC
  Administered 2020-03-02 – 2020-03-03 (×3): 3.375 g via INTRAVENOUS
  Filled 2020-03-02 (×4): qty 50

## 2020-03-02 NOTE — Progress Notes (Signed)
Initial Nutrition Assessment  DOCUMENTATION CODES:   Not applicable  INTERVENTION:  Monitor for diet advancement and will provide nutrition supplements as appropriate  Recommend obtaining current wt as able to assess trends and fully assess needs   NUTRITION DIAGNOSIS:   Inadequate oral intake related to altered GI function(SBO) as evidenced by NPO status   GOAL:   Patient will meet greater than or equal to 90% of their needs   MONITOR:   Labs, I & O's, Diet advancement, Weight trends  REASON FOR ASSESSMENT:   Malnutrition Screening Tool    ASSESSMENT:  RD working remotely.   59 year old female with history of ALS, postherpetic neuroglia who was recently admitted in February for SBO presented with complaints of worsening abdominal pain over the past few days and reports multiple episodes of diarrhea within an hour after having a regular bowel movement. CT abdomen pelvis concerning for inflammatory changes in small bowel and patient admitted for SBO.  Patient currently in ED, unable to contact via phone to obtain nutrition history at this time. Per chart review, general surgery contacted with recommendations for bowel rest, IV fluids and antibiotics.   No new weights this admission and no recent history for review. On 12/06/19 pt weighed 126.72 lbs and on 09/05/19 she weighed 142.12 lbs. This indicates a 15.4 lb (10.8%) wt loss in a period of 3 months which is significant. Per nutrition screening pt reports 16-17 lb wt loss since February. Recommend obtaining current wt as able to assess trends. Will use 57.6 kg for estimated needs at this time.   Medications reviewed and include: IVF: D5 NaCl @ 125 ml/hr IVPB: Zosyn Labs: CBGs 130,92, K 3.4 (L)  NUTRITION - FOCUSED PHYSICAL EXAM: Unable to complete at this time, RD working remotely.  Diet Order:   Diet Order            Diet NPO time specified  Diet effective now              EDUCATION NEEDS:   No education  needs have been identified at this time  Skin:  Skin Assessment: Reviewed RN Assessment  Last BM:  pta  Height:   Ht Readings from Last 1 Encounters:  12/06/19 5\' 3"  (1.6 m)    Weight:   Wt Readings from Last 1 Encounters:  12/06/19 57.6 kg    BMI:  There is no height or weight on file to calculate BMI.  Estimated Nutritional Needs:   Kcal:  1725-1900  Protein:  85-95  Fluid:  >/= 1.7 L/day   Lajuan Lines, RD, LDN Clinical Nutrition After Hours/Weekend Pager # in [place]

## 2020-03-02 NOTE — Progress Notes (Signed)
PROGRESS NOTE  Lori Alvarez [id number] DOB: 1960-10-29 DOA: 03/01/2020 PCP: Shirline Frees, MD  Hospital Course/Subjective: Lori Alvarez is a 59 y.o. female with history of ALS who was admitted in February 2 months ago for small bowel obstruction presents to the ER with complaint of worsening abdominal pain over the last few days. In the ER CT abdomen pelvis shows features concerning for inflammatory changes in the 1st section of the small bowel on the left midabdomen.  General surgery was consulted and recommended bowel rest IV fluids and antibiotics.  Labs showing WBC of 10.6 otherwise largely unremarkable.  Covid test is negative.   This morning resting comfortably in ER where she is boarding. Sister at the bedside. Patient still NPO, having some rumbling in her stomach and has been passing some gas this AM.  Assessment/Plan: Principal Problem:   SBO (small bowel obstruction) (HCC) Active Problems:   ALS (amyotrophic lateral sclerosis) (HCC)   MDS (myelodysplastic syndrome) (HCC)   Abdominal pain   1. Abdominal pain with diarrhea -appreciate general surgery consult has recommended to keep patient having bowel rest antibiotics and hydration.  She was started on Zosyn. Will advance diet per surgery. 2. History of ALS. 3. History of postherpetic neuralgia.  Given the severe abdominal pain with clinically appearing dehydrated patient will need more than 2 midnight stay in inpatient status.  DVT prophylaxis: SCDs for now will avoid pharmacological DVT prophylaxis in anticipation of possible procedure. Code Status: DNR. Family Communication: Family at the bedside. Disposition Plan: Home. Consults called: General surgery. Admission status: Inpatient   Objective: Vitals:   03/02/20 0700 03/02/20 0743 03/02/20 0908 03/02/20 0924  BP:  (!) 142/68 136/66 136/66  Pulse: 100 (!) 103 (!) 104 (!) 106  Resp: 13 17 16 14   Temp:      TempSrc:      SpO2: 98% 97%  97%     Intake/Output Summary (Last 24 hours) at 03/02/2020 1002 Last data filed at 03/02/2020 0719 Gross per 24 hour  Intake 600 ml  Output --  Net 600 ml   There were no vitals filed for this visit.   Exam: General:  Alert, oriented, calm, in no acute distress Eyes: EOMI, clear sclerea Neck: supple, no masses, trachea mildline  Cardiovascular: RRR, no murmurs or rubs, no peripheral edema  Respiratory: clear to auscultation bilaterally, no wheezes, no crackles  Abdomen: soft, nontender, nondistended, some bowel tones heard  Skin: dry, no rashes  Musculoskeletal: no joint effusions, normal range of motion  Psychiatric: appropriate affect, normal speech  Neurologic: extraocular muscles intact, clear speech, moving all extremities with intact sensorium    Data Reviewed: CBC: Recent Labs  Lab 03/01/20 1733 03/02/20 0620  WBC 10.6* 8.1  NEUTROABS  --  5.7  HGB 13.8 12.6  HCT 42.3 39.0  MCV 88.9 89.2  PLT 436* 0000000*   Basic Metabolic Panel: Recent Labs  Lab 03/01/20 1733 03/02/20 0620  NA 135 138  K 4.6 3.4*  CL 100 107  CO2 22 21*  GLUCOSE 104* 145*  BUN 10 6  CREATININE 0.42* 0.38*  CALCIUM 9.1 8.9   GFR: CrCl cannot be calculated (Unknown ideal weight.). Liver Function Tests: Recent Labs  Lab 03/01/20 1733 03/02/20 0620  AST 23 13*  ALT 24 23  ALKPHOS 106 99  BILITOT 0.7 0.8  PROT 7.4 6.8  ALBUMIN 4.1 3.8   Recent Labs  Lab 03/01/20 1733  LIPASE 22   No results for input(s): AMMONIA in  the last 168 hours. Coagulation Profile: No results for input(s): INR, PROTIME in the last 168 hours. Cardiac Enzymes: No results for input(s): CKTOTAL, CKMB, CKMBINDEX, TROPONINI in the last 168 hours. BNP (last 3 results) No results for input(s): PROBNP in the last 8760 hours. HbA1C: No results for input(s): HGBA1C in the last 72 hours. CBG: Recent Labs  Lab 03/02/20 0009 03/02/20 0805  GLUCAP 92 130*   Lipid Profile: No results for input(s): CHOL, HDL,  LDLCALC, TRIG, CHOLHDL, LDLDIRECT in the last 72 hours. Thyroid Function Tests: No results for input(s): TSH, T4TOTAL, FREET4, T3FREE, THYROIDAB in the last 72 hours. Anemia Panel: No results for input(s): VITAMINB12, FOLATE, FERRITIN, TIBC, IRON, RETICCTPCT in the last 72 hours. Urine analysis:    Component Value Date/Time   COLORURINE STRAW (A) 03/01/2020 1619   APPEARANCEUR CLEAR 03/01/2020 1619   LABSPEC 1.004 (L) 03/01/2020 1619   PHURINE 6.0 03/01/2020 1619   GLUCOSEU NEGATIVE 03/01/2020 1619   HGBUR NEGATIVE 03/01/2020 1619   BILIRUBINUR NEGATIVE 03/01/2020 1619   KETONESUR 20 (A) 03/01/2020 1619   PROTEINUR NEGATIVE 03/01/2020 1619   NITRITE NEGATIVE 03/01/2020 1619   LEUKOCYTESUR NEGATIVE 03/01/2020 1619   Sepsis Labs: @LABRCNTIP (procalcitonin:4,lacticidven:4)  ) Recent Results (from the past 240 hour(s))  SARS Coronavirus 2 by RT PCR (hospital order, performed in [place] hospital lab) Nasopharyngeal Nasopharyngeal Swab     Status: None   Collection Time: 03/01/20  5:33 PM   Specimen: Nasopharyngeal Swab  Result Value Ref Range Status   SARS Coronavirus 2 NEGATIVE NEGATIVE Final    Comment: (NOTE) SARS-CoV-2 target nucleic acids are NOT DETECTED. The SARS-CoV-2 RNA is generally detectable in upper and lower respiratory specimens during the acute phase of infection. The lowest concentration of SARS-CoV-2 viral copies this assay can detect is 250 copies / mL. A negative result does not preclude SARS-CoV-2 infection and should not be used as the sole basis for treatment or other patient management decisions.  A negative result may occur with improper specimen collection / handling, submission of specimen other than nasopharyngeal swab, presence of viral mutation(s) within the areas targeted by this assay, and inadequate number of viral copies (<250 copies / mL). A negative result must be combined with clinical observations, patient history, and epidemiological  information. Fact Sheet for Patients:   StrictlyIdeas.no Fact Sheet for Healthcare Providers: BankingDealers.co.za This test is not yet approved or cleared  by the [place] FDA and has been authorized for detection and/or diagnosis of SARS-CoV-2 by FDA under an Emergency Use Authorization (EUA).  This EUA will remain in effect (meaning this test can be used) for the duration of the COVID-19 declaration under Section 564(b)(1) of the Act, 21 U.S.C. section 360bbb-3(b)(1), unless the authorization is terminated or revoked sooner. Performed at [facility], [place] [address], [place], [place] [address]      Studies: CT ABDOMEN PELVIS W CONTRAST  Result Date: 03/01/2020 CLINICAL DATA:  Abdominal distension. Concern for small bowel obstruction. EXAM: CT ABDOMEN AND PELVIS WITH CONTRAST TECHNIQUE: Multidetector CT imaging of the abdomen and pelvis was performed using the standard protocol following bolus administration of intravenous contrast. CONTRAST:  179mL OMNIPAQUE IOHEXOL 300 MG/ML  SOLN COMPARISON:  Radiograph yesterday. CT enterography 11/30/2019 FINDINGS: Lower chest: Chronic atelectasis or scarring in the right middle and lower lobes. Mild hypoventilatory change in the dependent lungs. Normal heart size with coronary artery calcifications. Hepatobiliary: Decreased hepatic density consistent with steatosis. More focal fatty infiltration adjacent to the falciform ligament.  No suspicious hepatic lesion. Distended gallbladder without pericholecystic inflammation or calcified gallstone. No biliary dilatation. Pancreas: No ductal dilatation or inflammation. Spleen: Normal in size without focal abnormality. Adrenals/Urinary Tract: Mild left adrenal thickening without dominant nodule. Normal right adrenal gland. No hydronephrosis or perinephric edema. Homogeneous renal enhancement with symmetric excretion on delayed phase imaging.  Parapelvic cyst in the lower right kidney. Urinary bladder is physiologically distended without wall thickening. Stomach/Bowel: Physiologically distended stomach without abnormal distension or gastric wall thickening. Small duodenal diverticulum. Short to moderate length segment of small bowel in the left mid abdomen with wall thickening, enhancement, and perienteric edema. There is associated fecalization of small bowel contents at the distal aspect but no discrete transition point. This inflamed small bowel is mildly dilated measuring up to 3.5 cm. Small bowel is otherwise normal in caliber with scattered air in fluid but no evidence of obstruction. No terminal ileal inflammation. Post appendectomy. Air in stool in the colon without inflammation or wall thickening. Minor diverticulosis of the descending and sigmoid colon without diverticulitis. Vascular/Lymphatic: Moderate aortic atherosclerosis. No aortic aneurysm. Patent portal vein. Patent mesenteric vessels. Scattered central mesenteric nodes, largest measuring 6 mm, likely reactive. No enlarged lymph nodes in the abdomen or pelvis. Reproductive: Post hysterectomy. Ovaries are quiescent. No adnexal mass. Other: Minimal mesenteric edema in the left abdomen in the region of small bowel inflammation. No significant ascites. No free air. No intra-abdominal abscess. Tiny fat containing umbilical hernia. Musculoskeletal: There are no acute or suspicious osseous abnormalities. IMPRESSION: 1. Short to moderate length segment of small bowel in the left mid abdomen with wall thickening, enhancement, and perienteric edema, consistent with enteritis, likely infectious or inflammatory. There is fecalization of small bowel contents mild dilatation involving the inflamed small bowel however no other small bowel dilatation or evidence of obstruction. Overall improved small bowel dilatation since radiograph yesterday. 2. Mild hepatic steatosis. 3. Minimal colonic  diverticulosis without diverticulitis. Aortic Atherosclerosis (ICD10-I70.0). Electronically Signed   By: Keith Rake M.D.   On: 03/01/2020 20:03    Scheduled Meds:  Continuous Infusions: . dextrose 5 % and 0.9% NaCl 125 mL/hr at 03/01/20 2349  . piperacillin-tazobactam (ZOSYN)  IV       LOS: 1 day   Time spent: 23 minutes  Pedrohenrique Mccon[place] Marry Guan, MD [facility] Pager [phone]  If 7PM-7AM, please contact night-coverage [email or web address] Password [email or web address] 03/02/2020, 10:02 AM

## 2020-03-02 NOTE — ED Notes (Signed)
Pt placed in hospital bed. PT sister remains at bedside. Pt able to stand and walk to hospital bed. PT denies any needs at this time. Pt a/o.

## 2020-03-02 NOTE — Progress Notes (Signed)
Pt tachycardic at times, elevating pt status to yellow per MEWS protocol. Pt in no distress. Pt has not received home medications yet, MD paged.

## 2020-03-02 NOTE — Progress Notes (Addendum)
[place] [facility] Office:  [phone] General Surgery Progress Note   LOS: 1 day  POD -     Chief Complaint: Abdominal pain  Assessment and Plan: 1.  SBO vs ileus from enteritis  On Zosyn  Had some bowel function.  Will let patient have sips and ice chips.  Will check KUB in AM.  She is anxious to go home if no obvious intervention planned in the hospital.  2.  ALS  Diagnosed in March 2019.  Se[facility] Gustavus [facility]  She has almost no strength in her upper extremities 3.  MDS (this appears to be an incorrect diagnosis for the patient) 4.  GI - She is followed by Deliah Goody, [facility]. Dr. Alessandra Bevels did her colonoscopy, but it seems that Otila Kluver is her primary follow up   Principal Problem:   SBO (small bowel obstruction) ([place]) Active Problems:   ALS (amyotrophic lateral sclerosis) ([place])   MDS (myelodysplastic syndrome) ([place])   Abdominal pain  Subjective:  Passed flatus early this AM.  Still some abdominal discomfort, but better.  She is not interested at all in any surgery.  Husband, Marlou Sa, at bedside.  Objective:   Vitals:   03/02/20 1000 03/02/20 1200  BP: 138/75 139/77  Pulse: (!) 101 (!) 113  Resp: (!) 22 20  Temp:    SpO2: 99% 99%     Intake/Output from previous day:  05/14 0701 - 05/15 0700 In: 500 [I.V.:500] Out: -   Intake/Output this shift:  Total I/O In: 100 [IV Piggyback:100] Out: -    Physical Exam:   General: WN WF who is alert and oriented.    HEENT: Normal. Pupils equal. .   Lungs: Clear   Abdomen: Mild distention.  No localized tenderness.   Extremities:  Limited use of both UE   Lab Results:    Recent Labs    03/01/20 1733 03/02/20 0620  WBC 10.6* 8.1  HGB 13.8 12.6  HCT 42.3 39.0  PLT 436* 420*    BMET   Recent Labs    03/01/20 1733 03/02/20 0620  NA 135 138  K 4.6 3.4*  CL 100 107  CO2 22 21*  GLUCOSE 104* 145*  BUN 10 6  CREATININE 0.42* 0.38*  CALCIUM 9.1 8.9    PT/INR  No results for  input(s): LABPROT, INR in the last 72 hours.  ABG  No results for input(s): PHART, HCO3 in the last 72 hours.  Invalid input(s): PCO2, PO2   Studies/Results:  CT ABDOMEN PELVIS W CONTRAST  Result Date: 03/01/2020 CLINICAL DATA:  Abdominal distension. Concern for small bowel obstruction. EXAM: CT ABDOMEN AND PELVIS WITH CONTRAST TECHNIQUE: Multidetector CT imaging of the abdomen and pelvis was performed using the standard protocol following bolus administration of intravenous contrast. CONTRAST:  144mL OMNIPAQUE IOHEXOL 300 MG/ML  SOLN COMPARISON:  Radiograph yesterday. CT enterography 11/30/2019 FINDINGS: Lower chest: Chronic atelectasis or scarring in the right middle and lower lobes. Mild hypoventilatory change in the dependent lungs. Normal heart size with coronary artery calcifications. Hepatobiliary: Decreased hepatic density consistent with steatosis. More focal fatty infiltration adjacent to the falciform ligament. No suspicious hepatic lesion. Distended gallbladder without pericholecystic inflammation or calcified gallstone. No biliary dilatation. Pancreas: No ductal dilatation or inflammation. Spleen: Normal in size without focal abnormality. Adrenals/Urinary Tract: Mild left adrenal thickening without dominant nodule. Normal right adrenal gland. No hydronephrosis or perinephric edema. Homogeneous renal enhancement with symmetric excretion on delayed phase imaging. Parapelvic cyst in the  lower right kidney. Urinary bladder is physiologically distended without wall thickening. Stomach/Bowel: Physiologically distended stomach without abnormal distension or gastric wall thickening. Small duodenal diverticulum. Short to moderate length segment of small bowel in the left mid abdomen with wall thickening, enhancement, and perienteric edema. There is associated fecalization of small bowel contents at the distal aspect but no discrete transition point. This inflamed small bowel is mildly dilated measuring  up to 3.5 cm. Small bowel is otherwise normal in caliber with scattered air in fluid but no evidence of obstruction. No terminal ileal inflammation. Post appendectomy. Air in stool in the colon without inflammation or wall thickening. Minor diverticulosis of the descending and sigmoid colon without diverticulitis. Vascular/Lymphatic: Moderate aortic atherosclerosis. No aortic aneurysm. Patent portal vein. Patent mesenteric vessels. Scattered central mesenteric nodes, largest measuring 6 mm, likely reactive. No enlarged lymph nodes in the abdomen or pelvis. Reproductive: Post hysterectomy. Ovaries are quiescent. No adnexal mass. Other: Minimal mesenteric edema in the left abdomen in the region of small bowel inflammation. No significant ascites. No free air. No intra-abdominal abscess. Tiny fat containing umbilical hernia. Musculoskeletal: There are no acute or suspicious osseous abnormalities. IMPRESSION: 1. Short to moderate length segment of small bowel in the left mid abdomen with wall thickening, enhancement, and perienteric edema, consistent with enteritis, likely infectious or inflammatory. There is fecalization of small bowel contents mild dilatation involving the inflamed small bowel however no other small bowel dilatation or evidence of obstruction. Overall improved small bowel dilatation since radiograph yesterday. 2. Mild hepatic steatosis. 3. Minimal colonic diverticulosis without diverticulitis. Aortic Atherosclerosis (ICD10-I70.0). Electronically Signed   By: Keith Rake M.D.   On: 03/01/2020 20:03   DG Abd 2 Views  Result Date: 03/01/2020 CLINICAL DATA:  Abdominal cramping, history of small-bowel obstruction, ALS EXAM: ABDOMEN - 2 VIEW COMPARISON:  01/18/2020 FINDINGS: Scattered normal stool burden throughout colon. Small amount gas within stomach. Markedly dilated small bowel loop in the mid abdomen significantly increased from prior exam with paucity of distal colonic gas consistent with  small bowel obstruction. Air-fluid levels on upright view. No bowel wall thickening or free air. Osseous structures unremarkable. Minimal bibasilar atelectasis. No urinary tract calcification. IMPRESSION: Small-bowel obstruction at proximal to mid small bowel. These results will be called to the ordering clinician or representative by the Radiologist Assistant, and communication documented in the PACS or [employer]. Electronically Signed   By: Lavonia Dana M.D.   On: 03/01/2020 10:40     Anti-infectives:   Anti-infectives (From admission, onward)   Start     Dose/Rate Route Frequency Ordered Stop   03/02/20 1200  piperacillin-tazobactam (ZOSYN) IVPB 3.375 g     3.375 g 12.5 mL/hr over 240 Minutes Intravenous Every 8 hours 03/02/20 0506     03/02/20 0515  piperacillin-tazobactam (ZOSYN) IVPB 3.375 g  Status:  Discontinued     3.375 g 12.5 mL/hr over 240 Minutes Intravenous  Once 03/02/20 0506 03/02/20 0506   03/02/20 0515  piperacillin-tazobactam (ZOSYN) IVPB 3.375 g     3.375 g 100 mL/hr over 30 Minutes Intravenous  Once 03/02/20 0506 03/02/20 0719   03/02/20 0500  piperacillin-tazobactam (ZOSYN) IVPB 3.375 g  Status:  Discontinued     3.375 g 12.5 mL/hr over 240 Minutes Intravenous Every 8 hours 03/01/20 2240 03/02/20 0506   03/01/20 2245  piperacillin-tazobactam (ZOSYN) IVPB 3.375 g     3.375 g 100 mL/hr over 30 Minutes Intravenous  Once 03/01/20 2240 03/01/20 2349      Shanon Brow  Lucia Gaskins, MD, [facility] Office: [phone] 03/02/2020

## 2020-03-02 NOTE — ED Notes (Signed)
ED TO INPATIENT HANDOFF REPORT  ED Nurse Name and Phone #: [phone]  S Name/Age/Gender Lori Alvarez 59 y.o. female Room/Bed: WA13/WA13  Code Status   Code Status: DNR  Home/SNF/Other Home Patient oriented to: self, place, time and situation Is this baseline? Yes   Triage Complete: Triage complete  Chief Complaint SBO (small bowel obstruction) ([place]) [K56.609] Abdominal pain [R10.9]  Triage Note Patient reports seen by gastroenterologist yesterday and sent for further evaluation after abdominal XR showed bowel obstruction. C/o abdominal cramping. Denies vomiting. Hx of the same. Sister at bedside with patient. Hx ALS.    Allergies Allergies  Allergen Reactions  . Allegra [Fexofenadine]     Ringing in ears   . Clotrim-Undecyl-Tea Tr-Lav Oil     unknown  . Doxepin     Headaches  . Keflex [Cephalexin]     "too strong, made me drowsy and dizzy"  . Nitroglycerin     Migraine  . Nitrofuran Derivatives Rash  . Sulfa Antibiotics Rash    Level of Care/Admitting Diagnosis ED Disposition    ED Disposition Condition [facility]: [place] [id number]  Level of Care: Telemetry [5]  Admit to tele based on following criteria: Monitor for Ischemic changes  Covid Evaluation: Asymptomatic Screening Protocol (No Symptoms)  Diagnosis: Abdominal pain [id number]  Admitting Physician: Rise Patience [phone]  Attending Physician: Rise Patience [phone]  Estimated length of stay: past midnight tomorrow  Certification:: I certify this patient will need inpatient services for at least 2 midnights       B Medical/Surgery History Past Medical History:  Diagnosis Date  . ALS (amyotrophic lateral sclerosis) ([place])   . Postherpetic neuralgia    Past Surgical History:  Procedure Laterality Date  . APPENDECTOMY    . PARTIAL HYSTERECTOMY       A IV Location/Drains/Wounds Patient Lines/Drains/Airways Status   Active  Line/Drains/Airways    Name:   Placement date:   Placement time:   Site:   Days:   Peripheral IV 03/01/20 Left Antecubital   03/01/20    1732    Antecubital   1          Intake/Output Last 24 hours  Intake/Output Summary (Last 24 hours) at 03/02/2020 1223 Last data filed at 03/02/2020 0719 Gross per 24 hour  Intake 600 ml  Output --  Net 600 ml    Labs/Imaging Results for orders placed or performed during the hospital encounter of 03/01/20 (from the past 48 hour(s))  Urinalysis, Routine w reflex microscopic     Status: Abnormal   Collection Time: 03/01/20  4:19 PM  Result Value Ref Range   Color, Urine STRAW (A) YELLOW   APPearance CLEAR CLEAR   Specific Gravity, Urine 1.004 (L) 1.005 - 1.030   pH 6.0 5.0 - 8.0   Glucose, UA NEGATIVE NEGATIVE mg/dL   Hgb urine dipstick NEGATIVE NEGATIVE   Bilirubin Urine NEGATIVE NEGATIVE   Ketones, ur 20 (A) NEGATIVE mg/dL   Protein, ur NEGATIVE NEGATIVE mg/dL   Nitrite NEGATIVE NEGATIVE   Leukocytes,Ua NEGATIVE NEGATIVE    Comment: Performed at [facility], [place] [address], [place], [place] [address]  Lipase, blood     Status: None   Collection Time: 03/01/20  5:33 PM  Result Value Ref Range   Lipase 22 11 - 51 U/L    Comment: Performed at [facility], [place] [address], [place], [place] [address]  Comprehensive metabolic panel  Status: Abnormal   Collection Time: 03/01/20  5:33 PM  Result Value Ref Range   Sodium 135 135 - 145 mmol/L   Potassium 4.6 3.5 - 5.1 mmol/L   Chloride 100 98 - 111 mmol/L   CO2 22 22 - 32 mmol/L   Glucose, Bld 104 (H) 70 - 99 mg/dL    Comment: Glucose reference range applies only to samples taken after fasting for at least 8 hours.   BUN 10 6 - 20 mg/dL   Creatinine, Ser 0.42 (L) 0.44 - 1.00 mg/dL   Calcium 9.1 8.9 - 10.3 mg/dL   Total Protein 7.4 6.5 - 8.1 g/dL   Albumin 4.1 3.5 - 5.0 g/dL   AST 23 15 - 41 U/L   ALT 24 0 - 44 U/L   Alkaline Phosphatase 106 38 -  126 U/L   Total Bilirubin 0.7 0.3 - 1.2 mg/dL   GFR calc non Af Amer >60 >60 mL/min   GFR calc Af Amer >60 >60 mL/min   Anion gap 13 5 - 15    Comment: Performed at [facility], [place] [address] [place], [place] [address]  CBC     Status: Abnormal   Collection Time: 03/01/20  5:33 PM  Result Value Ref Range   WBC 10.6 (H) 4.0 - 10.5 K/uL   RBC 4.76 3.87 - 5.11 MIL/uL   Hemoglobin 13.8 12.0 - 15.0 g/dL   HCT 42.3 36.0 - 46.0 %   MCV 88.9 80.0 - 100.0 fL   MCH 29.0 26.0 - 34.0 pg   MCHC 32.6 30.0 - 36.0 g/dL   RDW 15.9 (H) 11.5 - 15.5 %   Platelets 436 (H) 150 - 400 K/uL   nRBC 0.0 0.0 - 0.2 %    Comment: Performed at [facility], [place] [address] [place], [place] [address]  SARS Coronavirus 2 by RT PCR (hospital order, performed in [facility] hospital lab) Nasopharyngeal Nasopharyngeal Swab     Status: None   Collection Time: 03/01/20  5:33 PM   Specimen: Nasopharyngeal Swab  Result Value Ref Range   SARS Coronavirus 2 NEGATIVE NEGATIVE    Comment: (NOTE) SARS-CoV-2 target nucleic acids are NOT DETECTED. The SARS-CoV-2 RNA is generally detectable in upper and lower respiratory specimens during the acute phase of infection. The lowest concentration of SARS-CoV-2 viral copies this assay can detect is 250 copies / mL. A negative result does not preclude SARS-CoV-2 infection and should not be used as the sole basis for treatment or other patient management decisions.  A negative result may occur with improper specimen collection / handling, submission of specimen other than nasopharyngeal swab, presence of viral mutation(s) within the areas targeted by this assay, and inadequate number of viral copies (<250 copies / mL). A negative result must be combined with clinical observations, patient history, and epidemiological information. Fact Sheet for Patients:   StrictlyIdeas.no Fact Sheet for Healthcare  Providers: BankingDealers.co.za This test is not yet approved or cleared  by the [place] FDA and has been authorized for detection and/or diagnosis of SARS-CoV-2 by FDA under an Emergency Use Authorization (EUA).  This EUA will remain in effect (meaning this test can be used) for the duration of the COVID-19 declaration under Section 564(b)(1) of the Act, 21 U.S.C. section 360bbb-3(b)(1), unless the authorization is terminated or revoked sooner. Performed at [facility], [place] [address], [place], [place] [address]   CBG monitoring, ED     Status: None  Collection Time: 03/02/20 12:09 AM  Result Value Ref Range   Glucose-Capillary 92 70 - 99 mg/dL    Comment: Glucose reference range applies only to samples taken after fasting for at least 8 hours.  Comprehensive metabolic panel     Status: Abnormal   Collection Time: 03/02/20  6:20 AM  Result Value Ref Range   Sodium 138 135 - 145 mmol/L   Potassium 3.4 (L) 3.5 - 5.1 mmol/L    Comment: DELTA CHECK NOTED   Chloride 107 98 - 111 mmol/L   CO2 21 (L) 22 - 32 mmol/L   Glucose, Bld 145 (H) 70 - 99 mg/dL    Comment: Glucose reference range applies only to samples taken after fasting for at least 8 hours.   BUN 6 6 - 20 mg/dL   Creatinine, Ser 0.38 (L) 0.44 - 1.00 mg/dL   Calcium 8.9 8.9 - 10.3 mg/dL   Total Protein 6.8 6.5 - 8.1 g/dL   Albumin 3.8 3.5 - 5.0 g/dL   AST 13 (L) 15 - 41 U/L   ALT 23 0 - 44 U/L   Alkaline Phosphatase 99 38 - 126 U/L   Total Bilirubin 0.8 0.3 - 1.2 mg/dL   GFR calc non Af Amer >60 >60 mL/min   GFR calc Af Amer >60 >60 mL/min   Anion gap 10 5 - 15    Comment: Performed at [facility], [place] [address] [place], [place] [address]  CBC with Differential/Platelet     Status: Abnormal   Collection Time: 03/02/20  6:20 AM  Result Value Ref Range   WBC 8.1 4.0 - 10.5 K/uL   RBC 4.37 3.87 - 5.11 MIL/uL   Hemoglobin 12.6 12.0 - 15.0 g/dL   HCT  39.0 36.0 - 46.0 %   MCV 89.2 80.0 - 100.0 fL   MCH 28.8 26.0 - 34.0 pg   MCHC 32.3 30.0 - 36.0 g/dL   RDW 15.9 (H) 11.5 - 15.5 %   Platelets 420 (H) 150 - 400 K/uL   nRBC 0.0 0.0 - 0.2 %   Neutrophils Relative % 70 %   Neutro Abs 5.7 1.7 - 7.7 K/uL   Lymphocytes Relative 22 %   Lymphs Abs 1.8 0.7 - 4.0 K/uL   Monocytes Relative 8 %   Monocytes Absolute 0.6 0.1 - 1.0 K/uL   Eosinophils Relative 0 %   Eosinophils Absolute 0.0 0.0 - 0.5 K/uL   Basophils Relative 0 %   Basophils Absolute 0.0 0.0 - 0.1 K/uL   Immature Granulocytes 0 %   Abs Immature Granulocytes 0.02 0.00 - 0.07 K/uL    Comment: Performed at [facility], [place] [address] [place], [place] [address]  CBG monitoring, ED     Status: Abnormal   Collection Time: 03/02/20  8:05 AM  Result Value Ref Range   Glucose-Capillary 130 (H) 70 - 99 mg/dL    Comment: Glucose reference range applies only to samples taken after fasting for at least 8 hours.   CT ABDOMEN PELVIS W CONTRAST  Result Date: 03/01/2020 CLINICAL DATA:  Abdominal distension. Concern for small bowel obstruction. EXAM: CT ABDOMEN AND PELVIS WITH CONTRAST TECHNIQUE: Multidetector CT imaging of the abdomen and pelvis was performed using the standard protocol following bolus administration of intravenous contrast. CONTRAST:  158mL OMNIPAQUE IOHEXOL 300 MG/ML  SOLN COMPARISON:  Radiograph yesterday. CT enterography 11/30/2019 FINDINGS: Lower chest: Chronic atelectasis or scarring in the right middle and lower lobes. Mild hypoventilatory change  in the dependent lungs. Normal heart size with coronary artery calcifications. Hepatobiliary: Decreased hepatic density consistent with steatosis. More focal fatty infiltration adjacent to the falciform ligament. No suspicious hepatic lesion. Distended gallbladder without pericholecystic inflammation or calcified gallstone. No biliary dilatation. Pancreas: No ductal dilatation or inflammation. Spleen: Normal in size  without focal abnormality. Adrenals/Urinary Tract: Mild left adrenal thickening without dominant nodule. Normal right adrenal gland. No hydronephrosis or perinephric edema. Homogeneous renal enhancement with symmetric excretion on delayed phase imaging. Parapelvic cyst in the lower right kidney. Urinary bladder is physiologically distended without wall thickening. Stomach/Bowel: Physiologically distended stomach without abnormal distension or gastric wall thickening. Small duodenal diverticulum. Short to moderate length segment of small bowel in the left mid abdomen with wall thickening, enhancement, and perienteric edema. There is associated fecalization of small bowel contents at the distal aspect but no discrete transition point. This inflamed small bowel is mildly dilated measuring up to 3.5 cm. Small bowel is otherwise normal in caliber with scattered air in fluid but no evidence of obstruction. No terminal ileal inflammation. Post appendectomy. Air in stool in the colon without inflammation or wall thickening. Minor diverticulosis of the descending and sigmoid colon without diverticulitis. Vascular/Lymphatic: Moderate aortic atherosclerosis. No aortic aneurysm. Patent portal vein. Patent mesenteric vessels. Scattered central mesenteric nodes, largest measuring 6 mm, likely reactive. No enlarged lymph nodes in the abdomen or pelvis. Reproductive: Post hysterectomy. Ovaries are quiescent. No adnexal mass. Other: Minimal mesenteric edema in the left abdomen in the region of small bowel inflammation. No significant ascites. No free air. No intra-abdominal abscess. Tiny fat containing umbilical hernia. Musculoskeletal: There are no acute or suspicious osseous abnormalities. IMPRESSION: 1. Short to moderate length segment of small bowel in the left mid abdomen with wall thickening, enhancement, and perienteric edema, consistent with enteritis, likely infectious or inflammatory. There is fecalization of small bowel  contents mild dilatation involving the inflamed small bowel however no other small bowel dilatation or evidence of obstruction. Overall improved small bowel dilatation since radiograph yesterday. 2. Mild hepatic steatosis. 3. Minimal colonic diverticulosis without diverticulitis. Aortic Atherosclerosis (ICD10-I70.0). Electronically Signed   By: Keith Rake M.D.   On: 03/01/2020 20:03   DG Abd 2 Views  Result Date: 03/01/2020 CLINICAL DATA:  Abdominal cramping, history of small-bowel obstruction, ALS EXAM: ABDOMEN - 2 VIEW COMPARISON:  01/18/2020 FINDINGS: Scattered normal stool burden throughout colon. Small amount gas within stomach. Markedly dilated small bowel loop in the mid abdomen significantly increased from prior exam with paucity of distal colonic gas consistent with small bowel obstruction. Air-fluid levels on upright view. No bowel wall thickening or free air. Osseous structures unremarkable. Minimal bibasilar atelectasis. No urinary tract calcification. IMPRESSION: Small-bowel obstruction at proximal to mid small bowel. These results will be called to the ordering clinician or representative by the Radiologist Assistant, and communication documented in the PACS or [employer]. Electronically Signed   By: Lavonia Dana M.D.   On: 03/01/2020 10:40    Pending Labs Unresulted Labs (From admission, onward)    Start     Ordered   03/02/20 0500  HIV Antibody (routine testing w rflx)  (HIV Antibody (Routine testing w reflex) panel)  Tomorrow morning,   R     03/01/20 2231          Vitals/Pain Today's Vitals   03/02/20 0908 03/02/20 0924 03/02/20 1000 03/02/20 1200  BP: 136/66 136/66 138/75 139/77  Pulse: (!) 104 (!) 106 (!) 101 (!) 113  Resp: 16 14 (!)  22 20  Temp:      TempSrc:      SpO2:  97% 99% 99%  PainSc:        Isolation Precautions No active isolations  Medications Medications  sodium chloride (PF) 0.9 % injection (has no administration in time range)   ondansetron (ZOFRAN) tablet 4 mg ( Oral See Alternative 03/01/20 2316)    Or  ondansetron (ZOFRAN) injection 4 mg (4 mg Intravenous Given 03/01/20 2316)  dextrose 5 %-0.9 % sodium chloride infusion ( Intravenous Paused 03/02/20 1150)  morphine 2 MG/ML injection 1 mg (1 mg Intravenous Given 03/01/20 2309)  piperacillin-tazobactam (ZOSYN) IVPB 3.375 g (3.375 g Intravenous New Bag/Given 03/02/20 1145)  0.9 %  sodium chloride infusion ( Intravenous Stopped 03/01/20 2318)  iohexol (OMNIPAQUE) 300 MG/ML solution 100 mL (100 mLs Intravenous Contrast Given 03/01/20 1915)  piperacillin-tazobactam (ZOSYN) IVPB 3.375 g (0 g Intravenous Stopped 03/01/20 2349)  piperacillin-tazobactam (ZOSYN) IVPB 3.375 g (0 g Intravenous Stopped 03/02/20 0719)    Mobility walks with person assist High fall risk   Focused Assessments .   R Recommendations: See Admitting Provider Note  Report given to:   Additional Notes: n/a

## 2020-03-02 NOTE — Progress Notes (Signed)
Pt needs assistance with all ADL's and has been approved by admitting physician and [facility] for an exception to vistitation policy. Pt allowed one visitor to stay overnight with pt,  Wanda Rednond.

## 2020-03-03 ENCOUNTER — Inpatient Hospital Stay (HOSPITAL_COMMUNITY): Payer: Medicare Other

## 2020-03-03 LAB — GLUCOSE, CAPILLARY: Glucose-Capillary: 89 mg/dL (ref 70–99)

## 2020-03-03 NOTE — Progress Notes (Signed)
Discharge instructions explained to patient. Several PT appointments are scheduled for her. No Prescriptions ordered. Paree states understanding her medication instructions. Also to make a follow up appointment with PCP. Discharged to friend who will take patient home to husband. Discharged via wheelchair.

## 2020-03-03 NOTE — Discharge Summary (Signed)
Discharge Summary  Lori Alvarez [id number] DOB: 04-Nov-1960  PCP: Shirline Frees, MD  Admit date: 03/01/2020 Discharge date: 03/03/2020   Recommendations for Outpatient Follow-up:  1. PCP 2 weeks.  Discharge Diagnoses:  Active Hospital Problems   Diagnosis Date Noted  . SBO (small bowel obstruction) ([place]) 12/06/2019  . Abdominal pain 03/01/2020  . ALS (amyotrophic lateral sclerosis) ([place]) 12/23/2017  . MDS (myelodysplastic syndrome) ([place]) 01/22/2015    Resolved Hospital Problems  No resolved problems to display.    Discharge Condition: Stable   Diet recommendation: Regular diet.  Vitals:   03/03/20 0315 03/03/20 0544  BP: 124/69 121/73  Pulse: (!) 107 (!) 107  Resp: 17 18  Temp: 98 F (36.7 C) 98.2 F (36.8 C)  SpO2: 100% 100%    HPI and Brief Hospital Course:  This is a pleasant 59 year old Caucasian female with a history of ALS who was admitted to the hospital 2 months ago for small bowel obstruction presented to the ER 5/14 with complaints of worsening abdominal pain over the last few days.  CT scan in the ER showed inflammatory changes in the first part of the duodenum in the left mid abdomen.  General surgery was consulted, recommended bowel rest IV fluids and empiric IV antibiotics.  She was treated with Zosyn, today her white blood cell count has normalized, she is chest and abdomen normal pain-free, tolerating a full liquid diet and will be discharged home today around lunchtime if continues to be well.  Discharge details, plan of care and follow up instructions were discussed with patient and any available family or care providers. Patient and family are in agreement with discharge from the hospital today and all questions were answered to their satisfaction.  Consultations:  General surgery  Discharge Exam: BP 121/73 (BP Location: Right Arm)   Pulse (!) 107   Temp 98.2 F (36.8 C) (Oral)   Resp 18   SpO2 100%  General:  Alert, oriented, calm,  in no acute distress  Eyes: EOMI, clear sclerea Neck: supple, no masses, trachea mildline  Cardiovascular: RRR, no murmurs or rubs, no peripheral edema  Respiratory: clear to auscultation bilaterally, no wheezes, no crackles  Abdomen: soft, nontender, nondistended, normal bowel tones heard  Skin: dry, no rashes  Musculoskeletal: no joint effusions, normal range of motion  Psychiatric: appropriate affect, normal speech  Neurologic: extraocular muscles intact, clear speech, moving all extremities with intact sensorium    Discharge Instructions You were cared for by a hospitalist during your hospital stay. If you have any questions about your discharge medications or the care you received while you were in the hospital after you are discharged, you can call the unit and asked to speak with the hospitalist on call if the hospitalist that took care of you is not available. Once you are discharged, your primary care physician will handle any further medical issues. Please note that NO REFILLS for any discharge medications will be authorized once you are discharged, as it is imperative that you return to your primary care physician (or establish a relationship with a primary care physician if you do not have one) for your aftercare needs so that they can reassess your need for medications and monitor your lab values.  Discharge Instructions    Diet - low sodium heart healthy   Complete by: As directed    Increase activity slowly   Complete by: As directed      Allergies as of 03/03/2020  Reactions   Allegra [fexofenadine]    Ringing in ears    Clotrim-undecyl-tea Tr-lav Oil    unknown   Doxepin    Headaches   Keflex [cephalexin]    "too strong, made me drowsy and dizzy"   Nitroglycerin    Migraine   Nitrofuran Derivatives Rash   Sulfa Antibiotics Rash      Medication List    TAKE these medications   acetaminophen 500 MG tablet Commonly known as: TYLENOL Take 500 mg by mouth in  the morning, at noon, in the evening, and at bedtime.   ALPRAZolam 0.5 MG tablet Commonly known as: XANAX Take 0.25-0.5 mg by mouth 2 (two) times daily. Take 1/2 tablet (0.25 mg) in the morning and Take 1 tablet (0.5 mg) at bedtime   baclofen 10 MG tablet Commonly known as: LIORESAL Take 10 mg by mouth 4 (four) times daily.   CALCIUM-VITAMIN D PO Take 1 tablet by mouth daily. 600-800   dicyclomine 20 MG tablet Commonly known as: BENTYL Take 20 mg by mouth 4 (four) times daily -  before meals and at bedtime.   DULoxetine 20 MG capsule Commonly known as: CYMBALTA Take 20 mg by mouth every evening.   melatonin 3 MG Tabs tablet Take 1 tablet by mouth at bedtime.   melatonin 5 MG Tabs Take 5 mg by mouth at bedtime.   meloxicam 15 MG tablet Commonly known as: MOBIC Take 15 mg by mouth daily.   MULTIVITAMIN ADULTS PO Take 1 tablet by mouth daily.   pregabalin 50 MG capsule Commonly known as: LYRICA Take 50-100 mg by mouth in the morning, at noon, in the evening, and at bedtime. Take 100 mg (2 capsules) at 10 am, Take 50 mg (1 capsules) at 2 pm, Take 100 mg (2 capsules) at 6 pm and Take 50 mg (1 capsule) at 10 pm.   Remifemin 20 MG Tabs Generic drug: Black Cohosh Take 20 mg by mouth every evening.   VITAMIN B-12 PO Take 500 mcg by mouth daily.   VITAMIN E PO Take 800 Units by mouth in the morning and at bedtime.      Allergies  Allergen Reactions  . Allegra [Fexofenadine]     Ringing in ears   . Clotrim-Undecyl-Tea Tr-Lav Oil     unknown  . Doxepin     Headaches  . Keflex [Cephalexin]     "too strong, made me drowsy and dizzy"  . Nitroglycerin     Migraine  . Nitrofuran Derivatives Rash  . Sulfa Antibiotics Rash      The results of significant diagnostics from this hospitalization (including imaging, microbiology, ancillary and laboratory) are listed below for reference.    Significant Diagnostic Studies: CT ABDOMEN PELVIS W CONTRAST  Result Date:  03/01/2020 CLINICAL DATA:  Abdominal distension. Concern for small bowel obstruction. EXAM: CT ABDOMEN AND PELVIS WITH CONTRAST TECHNIQUE: Multidetector CT imaging of the abdomen and pelvis was performed using the standard protocol following bolus administration of intravenous contrast. CONTRAST:  147mL OMNIPAQUE IOHEXOL 300 MG/ML  SOLN COMPARISON:  Radiograph yesterday. CT enterography 11/30/2019 FINDINGS: Lower chest: Chronic atelectasis or scarring in the right middle and lower lobes. Mild hypoventilatory change in the dependent lungs. Normal heart size with coronary artery calcifications. Hepatobiliary: Decreased hepatic density consistent with steatosis. More focal fatty infiltration adjacent to the falciform ligament. No suspicious hepatic lesion. Distended gallbladder without pericholecystic inflammation or calcified gallstone. No biliary dilatation. Pancreas: No ductal dilatation or inflammation. Spleen: Normal in size  without focal abnormality. Adrenals/Urinary Tract: Mild left adrenal thickening without dominant nodule. Normal right adrenal gland. No hydronephrosis or perinephric edema. Homogeneous renal enhancement with symmetric excretion on delayed phase imaging. Parapelvic cyst in the lower right kidney. Urinary bladder is physiologically distended without wall thickening. Stomach/Bowel: Physiologically distended stomach without abnormal distension or gastric wall thickening. Small duodenal diverticulum. Short to moderate length segment of small bowel in the left mid abdomen with wall thickening, enhancement, and perienteric edema. There is associated fecalization of small bowel contents at the distal aspect but no discrete transition point. This inflamed small bowel is mildly dilated measuring up to 3.5 cm. Small bowel is otherwise normal in caliber with scattered air in fluid but no evidence of obstruction. No terminal ileal inflammation. Post appendectomy. Air in stool in the colon without  inflammation or wall thickening. Minor diverticulosis of the descending and sigmoid colon without diverticulitis. Vascular/Lymphatic: Moderate aortic atherosclerosis. No aortic aneurysm. Patent portal vein. Patent mesenteric vessels. Scattered central mesenteric nodes, largest measuring 6 mm, likely reactive. No enlarged lymph nodes in the abdomen or pelvis. Reproductive: Post hysterectomy. Ovaries are quiescent. No adnexal mass. Other: Minimal mesenteric edema in the left abdomen in the region of small bowel inflammation. No significant ascites. No free air. No intra-abdominal abscess. Tiny fat containing umbilical hernia. Musculoskeletal: There are no acute or suspicious osseous abnormalities. IMPRESSION: 1. Short to moderate length segment of small bowel in the left mid abdomen with wall thickening, enhancement, and perienteric edema, consistent with enteritis, likely infectious or inflammatory. There is fecalization of small bowel contents mild dilatation involving the inflamed small bowel however no other small bowel dilatation or evidence of obstruction. Overall improved small bowel dilatation since radiograph yesterday. 2. Mild hepatic steatosis. 3. Minimal colonic diverticulosis without diverticulitis. Aortic Atherosclerosis (ICD10-I70.0). Electronically Signed   By: Keith Rake M.D.   On: 03/01/2020 20:03   DG Abd 2 Views  Result Date: 03/03/2020 CLINICAL DATA:  Small-bowel obstruction. EXAM: ABDOMEN - 2 VIEW COMPARISON:  Plain film of the abdomen dated 02/29/2020. FINDINGS: Today's bowel gas pattern is nonobstructive. No dilated small bowel loops are identified. Air and stool is seen within the distal colon. No evidence of abnormal fluid collection or free intraperitoneal air. Lung bases appear clear. Osseous structures are unremarkable. IMPRESSION: Nonobstructive bowel gas pattern. Electronically Signed   By: Franki Cabot M.D.   On: 03/03/2020 08:51   DG Abd 2 Views  Result Date:  03/01/2020 CLINICAL DATA:  Abdominal cramping, history of small-bowel obstruction, ALS EXAM: ABDOMEN - 2 VIEW COMPARISON:  01/18/2020 FINDINGS: Scattered normal stool burden throughout colon. Small amount gas within stomach. Markedly dilated small bowel loop in the mid abdomen significantly increased from prior exam with paucity of distal colonic gas consistent with small bowel obstruction. Air-fluid levels on upright view. No bowel wall thickening or free air. Osseous structures unremarkable. Minimal bibasilar atelectasis. No urinary tract calcification. IMPRESSION: Small-bowel obstruction at proximal to mid small bowel. These results will be called to the ordering clinician or representative by the Radiologist Assistant, and communication documented in the PACS or [employer]. Electronically Signed   By: Lavonia Dana M.D.   On: 03/01/2020 10:40    Microbiology: Recent Results (from the past 240 hour(s))  SARS Coronavirus 2 by RT PCR (hospital order, performed in [facility] hospital lab) Nasopharyngeal Nasopharyngeal Swab     Status: None   Collection Time: 03/01/20  5:33 PM   Specimen: Nasopharyngeal Swab  Result Value Ref Range Status  SARS Coronavirus 2 NEGATIVE NEGATIVE Final    Comment: (NOTE) SARS-CoV-2 target nucleic acids are NOT DETECTED. The SARS-CoV-2 RNA is generally detectable in upper and lower respiratory specimens during the acute phase of infection. The lowest concentration of SARS-CoV-2 viral copies this assay can detect is 250 copies / mL. A negative result does not preclude SARS-CoV-2 infection and should not be used as the sole basis for treatment or other patient management decisions.  A negative result may occur with improper specimen collection / handling, submission of specimen other than nasopharyngeal swab, presence of viral mutation(s) within the areas targeted by this assay, and inadequate number of viral copies (<250 copies / mL). A negative result must be  combined with clinical observations, patient history, and epidemiological information. Fact Sheet for Patients:   StrictlyIdeas.no Fact Sheet for Healthcare Providers: BankingDealers.co.za This test is not yet approved or cleared  by the [place] FDA and has been authorized for detection and/or diagnosis of SARS-CoV-2 by FDA under an Emergency Use Authorization (EUA).  This EUA will remain in effect (meaning this test can be used) for the duration of the COVID-19 declaration under Section 564(b)(1) of the Act, 21 U.S.C. section 360bbb-3(b)(1), unless the authorization is terminated or revoked sooner. Performed at [facility], [place] [address] [place], [place] [address]      Labs: Basic Metabolic Panel: Recent Labs  Lab 03/01/20 1733 03/02/20 0620  NA 135 138  K 4.6 3.4*  CL 100 107  CO2 22 21*  GLUCOSE 104* 145*  BUN 10 6  CREATININE 0.42* 0.38*  CALCIUM 9.1 8.9   Liver Function Tests: Recent Labs  Lab 03/01/20 1733 03/02/20 0620  AST 23 13*  ALT 24 23  ALKPHOS 106 99  BILITOT 0.7 0.8  PROT 7.4 6.8  ALBUMIN 4.1 3.8   Recent Labs  Lab 03/01/20 1733  LIPASE 22   No results for input(s): AMMONIA in the last 168 hours. CBC: Recent Labs  Lab 03/01/20 1733 03/02/20 0620  WBC 10.6* 8.1  NEUTROABS  --  5.7  HGB 13.8 12.6  HCT 42.3 39.0  MCV 88.9 89.2  PLT 436* 420*   Cardiac Enzymes: No results for input(s): CKTOTAL, CKMB, CKMBINDEX, TROPONINI in the last 168 hours. BNP: BNP (last 3 results) No results for input(s): BNP in the last 8760 hours.  ProBNP (last 3 results) No results for input(s): PROBNP in the last 8760 hours.  CBG: Recent Labs  Lab 03/02/20 0009 03/02/20 0805 03/02/20 1650 03/03/20 0735  GLUCAP 92 130* 115* 89    Time spent: 31 minutes were spent in preparing this discharge including medication reconciliation, counseling, and coordination of  care.  Signed:  Ressie Slevin Marry Guan, MD  [facility] 03/03/2020, 11:57 AM

## 2020-03-03 NOTE — Progress Notes (Signed)
Pt refused CBG at 12am.

## 2020-03-03 NOTE — Progress Notes (Addendum)
[facility] Office:  [phone] General Surgery Progress Note   LOS: 2 days  POD -     Chief Complaint: Abdominal pain  Assessment and Plan: 1.  SBO vs ileus from enteritis  On Zosyn  Doing much better. Soft diet.  Home later today if tolerates.    XR shows resolution of abnormal bowel gas pattern. 2.  ALS  Diagnosed in March 2019.  Seen by [facility] Gustavus Bryant at [facility]  She has almost no strength in her upper extremities  Pt is anxious to get back to her routine with PT tomorrow as she feels herself getting weaker.    3.  MDS (this appears to be an incorrect diagnosis for the patient) 4.  GI - She is followed by Deliah Goody, [facility]. Dr. Alessandra Bevels did her colonoscopy, but it seems that Otila Kluver is her primary follow up   Principal Problem:   SBO (small bowel obstruction) ([place]) Active Problems:   ALS (amyotrophic lateral sclerosis) ([place])   MDS (myelodysplastic syndrome) ([place])   Abdominal pain  Subjective:  + "lots" of flatus. No BM yet.  No n/v.  No recurrence of pain.  Has not had pain meds since 5/14.    Objective:   Vitals:   03/03/20 0315 03/03/20 0544  BP: 124/69 121/73  Pulse: (!) 107 (!) 107  Resp: 17 18  Temp: 98 F (36.7 C) 98.2 F (36.8 C)  SpO2: 100% 100%     Intake/Output from previous day:  05/15 0701 - 05/16 0700 In: 1469.3 [P.O.:50; I.V.:1196.2; IV Piggyback:223.2] Out: -   Intake/Output this shift:  No intake/output data recorded.   Physical Exam:   General: WN WF who is alert and oriented. No distress.   HEENT: Normal. Pupils equal. .   Lungs: breathing comfortably   Abdomen: non distended.  Non tender.  No Hsm   Extremities:  Limited use of both UE   Lab Results:    Recent Labs    03/01/20 1733 03/02/20 0620  WBC 10.6* 8.1  HGB 13.8 12.6  HCT 42.3 39.0  PLT 436* 420*    BMET   Recent Labs    03/01/20 1733 03/02/20 0620  NA 135 138  K 4.6 3.4*  CL 100 107  CO2 22 21*  GLUCOSE 104* 145*  BUN 10 6   CREATININE 0.42* 0.38*  CALCIUM 9.1 8.9    PT/INR  No results for input(s): LABPROT, INR in the last 72 hours.  ABG  No results for input(s): PHART, HCO3 in the last 72 hours.  Invalid input(s): PCO2, PO2   Studies/Results:  CT ABDOMEN PELVIS W CONTRAST  Result Date: 03/01/2020 CLINICAL DATA:  Abdominal distension. Concern for small bowel obstruction. EXAM: CT ABDOMEN AND PELVIS WITH CONTRAST TECHNIQUE: Multidetector CT imaging of the abdomen and pelvis was performed using the standard protocol following bolus administration of intravenous contrast. CONTRAST:  161mL OMNIPAQUE IOHEXOL 300 MG/ML  SOLN COMPARISON:  Radiograph yesterday. CT enterography 11/30/2019 FINDINGS: Lower chest: Chronic atelectasis or scarring in the right middle and lower lobes. Mild hypoventilatory change in the dependent lungs. Normal heart size with coronary artery calcifications. Hepatobiliary: Decreased hepatic density consistent with steatosis. More focal fatty infiltration adjacent to the falciform ligament. No suspicious hepatic lesion. Distended gallbladder without pericholecystic inflammation or calcified gallstone. No biliary dilatation. Pancreas: No ductal dilatation or inflammation. Spleen: Normal in size without focal abnormality. Adrenals/Urinary Tract: Mild left adrenal thickening without dominant nodule. Normal right adrenal gland. No hydronephrosis or perinephric edema.  Homogeneous renal enhancement with symmetric excretion on delayed phase imaging. Parapelvic cyst in the lower right kidney. Urinary bladder is physiologically distended without wall thickening. Stomach/Bowel: Physiologically distended stomach without abnormal distension or gastric wall thickening. Small duodenal diverticulum. Short to moderate length segment of small bowel in the left mid abdomen with wall thickening, enhancement, and perienteric edema. There is associated fecalization of small bowel contents at the distal aspect but no discrete  transition point. This inflamed small bowel is mildly dilated measuring up to 3.5 cm. Small bowel is otherwise normal in caliber with scattered air in fluid but no evidence of obstruction. No terminal ileal inflammation. Post appendectomy. Air in stool in the colon without inflammation or wall thickening. Minor diverticulosis of the descending and sigmoid colon without diverticulitis. Vascular/Lymphatic: Moderate aortic atherosclerosis. No aortic aneurysm. Patent portal vein. Patent mesenteric vessels. Scattered central mesenteric nodes, largest measuring 6 mm, likely reactive. No enlarged lymph nodes in the abdomen or pelvis. Reproductive: Post hysterectomy. Ovaries are quiescent. No adnexal mass. Other: Minimal mesenteric edema in the left abdomen in the region of small bowel inflammation. No significant ascites. No free air. No intra-abdominal abscess. Tiny fat containing umbilical hernia. Musculoskeletal: There are no acute or suspicious osseous abnormalities. IMPRESSION: 1. Short to moderate length segment of small bowel in the left mid abdomen with wall thickening, enhancement, and perienteric edema, consistent with enteritis, likely infectious or inflammatory. There is fecalization of small bowel contents mild dilatation involving the inflamed small bowel however no other small bowel dilatation or evidence of obstruction. Overall improved small bowel dilatation since radiograph yesterday. 2. Mild hepatic steatosis. 3. Minimal colonic diverticulosis without diverticulitis. Aortic Atherosclerosis (ICD10-I70.0). Electronically Signed   By: Keith Rake M.D.   On: 03/01/2020 20:03   DG Abd 2 Views  Result Date: 03/03/2020 CLINICAL DATA:  Small-bowel obstruction. EXAM: ABDOMEN - 2 VIEW COMPARISON:  Plain film of the abdomen dated 02/29/2020. FINDINGS: Today's bowel gas pattern is nonobstructive. No dilated small bowel loops are identified. Air and stool is seen within the distal colon. No evidence of  abnormal fluid collection or free intraperitoneal air. Lung bases appear clear. Osseous structures are unremarkable. IMPRESSION: Nonobstructive bowel gas pattern. Electronically Signed   By: Franki Cabot M.D.   On: 03/03/2020 08:51     Anti-infectives:   Anti-infectives (From admission, onward)   Start     Dose/Rate Route Frequency Ordered Stop   03/02/20 1200  piperacillin-tazobactam (ZOSYN) IVPB 3.375 g     3.375 g 12.5 mL/hr over 240 Minutes Intravenous Every 8 hours 03/02/20 0506     03/02/20 0515  piperacillin-tazobactam (ZOSYN) IVPB 3.375 g  Status:  Discontinued     3.375 g 12.5 mL/hr over 240 Minutes Intravenous  Once 03/02/20 0506 03/02/20 0506   03/02/20 0515  piperacillin-tazobactam (ZOSYN) IVPB 3.375 g     3.375 g 100 mL/hr over 30 Minutes Intravenous  Once 03/02/20 0506 03/02/20 0719   03/02/20 0500  piperacillin-tazobactam (ZOSYN) IVPB 3.375 g  Status:  Discontinued     3.375 g 12.5 mL/hr over 240 Minutes Intravenous Every 8 hours 03/01/20 2240 03/02/20 0506   03/01/20 2245  piperacillin-tazobactam (ZOSYN) IVPB 3.375 g     3.375 g 100 mL/hr over 30 Minutes Intravenous  Once 03/01/20 2240 03/01/20 2349     Milus Height, MD FACS Surgical Oncology, General Surgery, Trauma and [facility], [place] for weekday/non holidays Check [email or web address] for coverage night/weekend/holidays  Do not use SecureChat as it  is not reliable for timely patient care.

## 2020-03-04 ENCOUNTER — Ambulatory Visit: Payer: Medicare Other | Admitting: Physical Therapy

## 2020-03-06 ENCOUNTER — Encounter: Payer: Medicare Other | Admitting: Physical Therapy

## 2020-03-11 ENCOUNTER — Encounter: Payer: Medicare Other | Admitting: Physical Therapy

## 2020-03-11 ENCOUNTER — Other Ambulatory Visit: Payer: Self-pay

## 2020-03-11 ENCOUNTER — Other Ambulatory Visit: Payer: Medicare Other

## 2020-03-11 DIAGNOSIS — Z515 Encounter for palliative care: Secondary | ICD-10-CM

## 2020-03-12 NOTE — Progress Notes (Signed)
COMMUNITY PALLIATIVE CARE SW NOTE  PATIENT NAME: Lori Alvarez DOB: November 04, 1960 MRN: [id number]  PRIMARY CARE PROVIDER: Shirline Frees, MD  RESPONSIBLE PARTY:  Acct ID - Guarantor Home Phone Work Phone Relationship Acct Type  [id number] Erline [phone]  Self P/F     [address], [place], [place] [address]   Due to the COVID-19, this visit was done via telephone from my office and it was initiated and consent by this patient,   PLAN OF CARE and INTERVENTIONS:             1. GOALS OF CARE/ ADVANCE CARE PLANNING:  The goal is for patient to remain in her home as independently and safe as possible. Patient is a DNR. 2. SOCIAL/EMOTIONAL/SPIRITUAL ASSESSMENT/ INTERVENTIONS:  SW completed a telephonic visit with patient when she called to set up a scheduled visit. Patient preferred a telephonic visit now and asked SW to call next month to schedule an in-person visit. Patient stated that she was not feeling well. She has had recent hospitalization due to bowel obstruction. She stated that she has has ongoing GI issues, increased weakness and fatigue. Because of the bowel issues and fatigue, she is sleeping well past two o'clock the past couple of days. Patient reported that she has not looked into the resources SW provided for private caregivers, but plans to. She expressed appreciation for SW continuing to reach out to her and is hoping that a face-to-face visit could be done next month-SW to call and follow-up.  3. PATIENT/CAREGIVER EDUCATION/ COPING:  Despite recent changes in condition and hospitalization, patient reports that she is coping adequately.  4. PERSONAL EMERGENCY PLAN:  Patient's physician can be called with any concerns or changes in condition. SW reinforced access to palliative care support. 911 can be activated for emergencies.  5. COMMUNITY RESOURCES COORDINATION/ HEALTH CARE NAVIGATION:  SW provided resources for private sitters-patient to follow-up.   6. FINANCIAL/LEGAL CONCERNS/INTERVENTIONS:  No financial or legal issues presented.      SOCIAL HX:  Social History   Tobacco Use  . Smoking status: Never Smoker  . Smokeless tobacco: Never Used  Substance Use Topics  . Alcohol use: No    CODE STATUS:  DNR ADVANCED DIRECTIVES: No MOST FORM COMPLETE:  No HOSPICE EDUCATION PROVIDED: No  PPS: Patient ambulates independently. Ongoing progression of her ALS, causes patient not to have use of her arms and hands which causes balances issues and she is at risk for falls.  Duration of telephonic visit and documentation: 30 minutes.      [address] [place], [place]

## 2020-03-13 ENCOUNTER — Other Ambulatory Visit: Payer: Self-pay

## 2020-03-13 ENCOUNTER — Ambulatory Visit: Payer: Medicare Other | Admitting: Physical Therapy

## 2020-03-13 ENCOUNTER — Encounter: Payer: Self-pay | Admitting: Physical Therapy

## 2020-03-13 DIAGNOSIS — M542 Cervicalgia: Secondary | ICD-10-CM | POA: Diagnosis not present

## 2020-03-13 DIAGNOSIS — M6281 Muscle weakness (generalized): Secondary | ICD-10-CM | POA: Diagnosis not present

## 2020-03-13 DIAGNOSIS — M5412 Radiculopathy, cervical region: Secondary | ICD-10-CM | POA: Diagnosis not present

## 2020-03-13 NOTE — Therapy (Signed)
[place] [address] [place], [place], [address] Phone: [phone]   Fax:  [phone]  Physical Therapy Treatment  Patient Details  Name: Lori Alvarez MRN: [id number] Date of Birth: 29-Nov-1960 Referring Provider (PT): Elsner   Encounter Date: 03/13/2020  PT End of Session - 03/13/20 1523    Visit Number  23    Date for PT Re-Evaluation  03/28/20    PT Start Time  1435    PT Stop Time  1538    PT Time Calculation (min)  63 min    Activity Tolerance  Patient limited by fatigue    Behavior During Therapy  [facility] for tasks assessed/performed       Past Medical History:  Diagnosis Date  . ALS (amyotrophic lateral sclerosis) ([place])   . Postherpetic neuralgia     Past Surgical History:  Procedure Laterality Date  . APPENDECTOMY    . PARTIAL HYSTERECTOMY      There were no vitals filed for this visit.  Subjective Assessment - 03/13/20 1442    Subjective  Patient has not been in to see [place] in two weeks, she had a 2 night hospital stay due to stomach issues.  She reports that she has really regressed with her function, reports that she did not sleep for 3 night, did not do much.  Reports that she hurts all over,    Currently in Pain?  Yes    Pain Score  6     Pain Location  Neck    Pain Descriptors / Indicators  Aching;Sore    Aggravating Factors   being in active, lack of sleep                        [facility] Adult PT Treatment/Exercise - 03/13/20 0001      Neck Exercises: Machines for Strengthening   Nustep  Level 3 x 6 minutes no arms    Other Machines for Strengthening  leg extension 5# 3x10, leg curls 20# x10, 15# x 10    Other Machines for Strengthening  leg press 20# x10, then no weight single legs x 10 each with sets of 5      Neck Exercises: Standing   Other Standing Exercises  8" toe touches with close CGA, 4" step ups      Neck Exercises: Seated   Other Seated Exercise  sit to  stand no hands x 5 , the last two needed slight assist    Other Seated Exercise  use of computer cart to have her push and pull with arms, use of velcro board having her peel items off for finger dexterity and hand use, could not do left if fully attached to the velcro, toe raises and heel raises      Moist Heat Therapy   Number Minutes Moist Heat  15 Minutes    Moist Heat Location  Cervical      Electrical Stimulation   Electrical Stimulation Location  upper trap and into the cervical area    Electrical Stimulation Action  IFC    Electrical Stimulation Parameters  seated    Electrical Stimulation Goals  Pain      Manual Therapy   Manual Therapy  Soft tissue mobilization    Manual therapy comments  gentle stretches of the upper traps, some passive motions of the hands, finger and the elbows approximation with motions of the fingers trying to get some  increased movements and use, PROM of the shoulders, had some pain in the triceps and the lat with the end range flexion    Soft tissue mobilization  cervical, upper traps and rhomboids, left pectoral and left biceps               PT Short Term Goals - 08/14/19 1426      PT SHORT TERM GOAL #1   Title  independent with initial HEP    Status  Achieved        PT Long Term Goals - 03/13/20 1526      PT LONG TERM GOAL #1   Title  report pain decreased 25%    Status  Partially Met            Plan - 03/13/20 1524    Clinical Impression Statement  Patient really has had a set back, she was hospitalized for a few days and she reports that she did not sleep, the hospitalization was for stomach issues.  She did have a fall while in the hospital, she really seems to have regressed especially with arm movements, she was a little more unsteady and needed some close CGA for walking in the gym.  Her neck and upper traps were more tight and tender    PT Next Visit Plan  resume activities as we can    Consulted and Agree with Plan of  Care  Patient       Patient will benefit from skilled therapeutic intervention in order to improve the following deficits and impairments:  Pain, Improper body mechanics, Impaired sensation, Increased muscle spasms, Postural dysfunction, Impaired tone, Impaired UE functional use, Decreased strength, Decreased range of motion, Decreased activity tolerance, Impaired flexibility  Visit Diagnosis: Muscle weakness (generalized)  Cervicalgia  Radiculopathy, cervical region     Problem List Patient Active Problem List   Diagnosis Date Noted  . Abdominal pain 03/01/2020  . Small bowel obstruction ([place]) 12/06/2019  . SBO (small bowel obstruction) ([place]) 12/06/2019  . Collagenous colitis 07/28/2018  . Decreased sensation of lower extremity 07/20/2018  . ALS (amyotrophic lateral sclerosis) ([place]) 12/23/2017  . Muscle weakness (generalized) 11/23/2017  . Bilateral arm weakness 11/11/2017  . Elevated blood pressure reading without diagnosis of hypertension 11/11/2017  . Post herpetic neuralgia 11/11/2017  . MDS (myelodysplastic syndrome) ([place]) 01/22/2015    Sumner Boast., PT 03/13/2020, 3:27 PM  [address] [place], [place], [address] Phone: [phone]   Fax:  [phone]  Name: SHAMECCA WHITEBREAD MRN: [id number] Date of Birth: 04-17-1961

## 2020-03-20 ENCOUNTER — Other Ambulatory Visit: Payer: Self-pay

## 2020-03-20 ENCOUNTER — Ambulatory Visit: Payer: Medicare Other | Attending: Neurological Surgery | Admitting: Physical Therapy

## 2020-03-20 ENCOUNTER — Encounter: Payer: Self-pay | Admitting: Physical Therapy

## 2020-03-20 DIAGNOSIS — M542 Cervicalgia: Secondary | ICD-10-CM | POA: Diagnosis present

## 2020-03-20 DIAGNOSIS — M6281 Muscle weakness (generalized): Secondary | ICD-10-CM | POA: Diagnosis present

## 2020-03-20 DIAGNOSIS — M5412 Radiculopathy, cervical region: Secondary | ICD-10-CM | POA: Diagnosis present

## 2020-03-20 NOTE — Therapy (Signed)
[place] [address] [address] [place], [place], [address] Phone: [phone]   Fax:  [phone]  Physical Therapy Treatment  Patient Details  Name: Lori Alvarez MRN: [id number] Date of Birth: [birth year] Referring Provider (PT): Elsner   Encounter Date: 03/20/2020  PT End of Session - 03/20/20 1623    Visit Number  45    Date for PT Re-Evaluation  03/28/20    PT Start Time  1438    PT Stop Time  1525    PT Time Calculation (min)  47 min    Activity Tolerance  Patient limited by fatigue    Behavior During Therapy  [facility] for tasks assessed/performed       Past Medical History:  Diagnosis Date  . ALS (amyotrophic lateral sclerosis) ([place])   . Postherpetic neuralgia     Past Surgical History:  Procedure Laterality Date  . APPENDECTOMY    . PARTIAL HYSTERECTOMY      There were no vitals filed for this visit.  Subjective Assessment - 03/20/20 1445    Subjective  No falls, reports difficulty getting up from toilet    Currently in Pain?  Yes    Pain Score  4     Pain Location  Neck    Pain Descriptors / Indicators  Aching;Spasm;Sore    Aggravating Factors   sitting too much                        [facility] Adult PT Treatment/Exercise - 03/20/20 0001      Ambulation/Gait   Gait Comments  stairs one at a time down but only half a flight due to weakness and not feeling steady, stairs up step over step      High Level Balance   High Level Balance Activities  Side stepping;Direction changes;Sudden stops      Neck Exercises: Machines for Strengthening   Nustep  Level 3 x 6 minutes no arms    Other Machines for Strengthening  leg extension 5# 3x10, leg curls 20# x10, 15# x 10    Other Machines for Strengthening  leg press 20# x10, then no weight single legs x 10 each with sets of 5      Neck Exercises: Seated   Other Seated Exercise  sit to stand 2x5 at 19", 2x5 at 21"      Neck Exercises: Sidelying    Other Sidelying Exercise  clams bilaterally      Manual Therapy   Manual Therapy  Soft tissue mobilization    Manual therapy comments  gentle stretches of the upper traps, some passive motions of the hands, finger and the elbows approximation with motions of the fingers trying to get some increased movements and use, PROM of the shoulders, had some pain in the triceps and the lat with the end range flexion    Soft tissue mobilization  cervical, upper traps and rhomboids, left pectoral and left biceps               PT Short Term Goals - 08/14/19 1426      PT SHORT TERM GOAL #1   Title  independent with initial HEP    Status  Achieved        PT Long Term Goals - 03/20/20 1626      PT LONG TERM GOAL #1   Title  report pain decreased 25%    Status  Partially Met  Plan - 03/20/20 1623    Clinical Impression Statement  Patient continues to struggle with fatigue and strength, the hospitalization really set her back.  She reports more difficulty getting up from the toilet, I worked with her some on getting up from lower heights and how to have her husband help her if she is on the floor, the arms are really wasting away    PT Next Visit Plan  resume activities as we can    Consulted and Agree with Plan of Care  Patient       Patient will benefit from skilled therapeutic intervention in order to improve the following deficits and impairments:  Pain, Improper body mechanics, Impaired sensation, Increased muscle spasms, Postural dysfunction, Impaired tone, Impaired UE functional use, Decreased strength, Decreased range of motion, Decreased activity tolerance, Impaired flexibility  Visit Diagnosis: Muscle weakness (generalized)  Cervicalgia  Radiculopathy, cervical region     Problem List Patient Active Problem List   Diagnosis Date Noted  . Abdominal pain 03/01/2020  . Small bowel obstruction ([place]) 12/06/2019  . SBO (small bowel obstruction) ([place])  12/06/2019  . Collagenous colitis 07/28/2018  . Decreased sensation of lower extremity 07/20/2018  . ALS (amyotrophic lateral sclerosis) ([place]) 12/23/2017  . Muscle weakness (generalized) 11/23/2017  . Bilateral arm weakness 11/11/2017  . Elevated blood pressure reading without diagnosis of hypertension 11/11/2017  . Post herpetic neuralgia 11/11/2017  . MDS (myelodysplastic syndrome) ([place]) 01/22/2015    Sumner Boast., PT 03/20/2020, 4:27 PM  [address] [place], [place], [address] Phone: [phone]   Fax:  [phone]  Name: Lori Alvarez MRN: [id number] Date of Birth: [birth year]

## 2020-03-25 ENCOUNTER — Encounter: Payer: Self-pay | Admitting: Physical Therapy

## 2020-03-25 ENCOUNTER — Other Ambulatory Visit: Payer: Self-pay

## 2020-03-25 ENCOUNTER — Ambulatory Visit: Payer: Medicare Other | Admitting: Physical Therapy

## 2020-03-25 DIAGNOSIS — M5412 Radiculopathy, cervical region: Secondary | ICD-10-CM

## 2020-03-25 DIAGNOSIS — M6281 Muscle weakness (generalized): Secondary | ICD-10-CM | POA: Diagnosis not present

## 2020-03-25 DIAGNOSIS — M542 Cervicalgia: Secondary | ICD-10-CM

## 2020-03-25 NOTE — Therapy (Signed)
[place] [address], [place], [address] Phone: [phone]   Fax:  [phone]  Physical Therapy Treatment  Patient Details  Name: Lori Alvarez MRN: [id number] Date of Birth: 1960-11-29 Referring Provider (PT): Elsner   Encounter Date: 03/25/2020  PT End of Session - 03/25/20 1614    Visit Number  64    Date for PT Re-Evaluation  03/28/20    PT Start Time  1442    PT Stop Time  1530    PT Time Calculation (min)  48 min    Activity Tolerance  Patient tolerated treatment well    Behavior During Therapy  [facility] for tasks assessed/performed       Past Medical History:  Diagnosis Date  . ALS (amyotrophic lateral sclerosis) ([place])   . Postherpetic neuralgia     Past Surgical History:  Procedure Laterality Date  . APPENDECTOMY    . PARTIAL HYSTERECTOMY      There were no vitals filed for this visit.  Subjective Assessment - 03/25/20 1610    Subjective  reports that she is doing okay, still struggling with getting off of toilet, no falls, still hurting in the neck area    Currently in Pain?  Yes    Pain Score  3     Pain Location  Neck    Pain Descriptors / Indicators  Sore;Tightness                        [facility] Adult PT Treatment/Exercise - 03/25/20 0001      Neck Exercises: Machines for Strengthening   Nustep  Level 3 x 6 minutes no arms    Other Machines for Strengthening  leg extension 5# 3x10, leg curls 20# x10, 15# x 10    Other Machines for Strengthening  leg press 20# x10, then no weight single legs x 10 each with sets of 5      Neck Exercises: Standing   Other Standing Exercises  resisted gait fwd and backward, then side stepping both directions      Neck Exercises: Seated   Other Seated Exercise  sit to stand 2x5 at 19", 2x5 at 21"    Other Seated Exercise  used competer cart to have her push and pull it with each hand, Bosu behind partial sit ups 2 x 10       Manual Therapy   Manual Therapy  Soft tissue mobilization    Manual therapy comments  gentle stretches of the upper traps, some passive motions of the hands, finger and the elbows approximation with motions of the fingers trying to get some increased movements and use, PROM of the shoulders, had some pain in the triceps and the lat with the end range flexion    Soft tissue mobilization  cervical, upper traps and rhomboids, left pectoral and left biceps    Manual Traction  occipital release, shoulder depression               PT Short Term Goals - 08/14/19 1426      PT SHORT TERM GOAL #1   Title  independent with initial HEP    Status  Achieved        PT Long Term Goals - 03/20/20 1626      PT LONG TERM GOAL #1   Title  report pain decreased 25%    Status  Partially Met  Plan - 03/25/20 1614    Clinical Impression Statement  Patient did well with adding more to her exercises, she really is losing mm tone and use of the arm, this is causing some increased compensation in the neck and she is tighter and sore in the rhomboids.    PT Next Visit Plan  continue to push her to work on strength and function    Consulted and Agree with Plan of Care  Patient       Patient will benefit from skilled therapeutic intervention in order to improve the following deficits and impairments:  Pain, Improper body mechanics, Impaired sensation, Increased muscle spasms, Postural dysfunction, Impaired tone, Impaired UE functional use, Decreased strength, Decreased range of motion, Decreased activity tolerance, Impaired flexibility  Visit Diagnosis: Muscle weakness (generalized)  Cervicalgia  Radiculopathy, cervical region     Problem List Patient Active Problem List   Diagnosis Date Noted  . Abdominal pain 03/01/2020  . Small bowel obstruction ([place]) 12/06/2019  . SBO (small bowel obstruction) ([place]) 12/06/2019  . Collagenous colitis 07/28/2018  . Decreased sensation of  lower extremity 07/20/2018  . ALS (amyotrophic lateral sclerosis) ([place]) 12/23/2017  . Muscle weakness (generalized) 11/23/2017  . Bilateral arm weakness 11/11/2017  . Elevated blood pressure reading without diagnosis of hypertension 11/11/2017  . Post herpetic neuralgia 11/11/2017  . MDS (myelodysplastic syndrome) ([place]) 01/22/2015    Sumner Boast., PT 03/25/2020, 4:16 PM  [address] [place], [place], [address] Phone: [phone]   Fax:  [phone]  Name: VANESA RENIER MRN: [id number] Date of Birth: October 18, 1961

## 2020-03-27 ENCOUNTER — Other Ambulatory Visit: Payer: Self-pay

## 2020-03-27 ENCOUNTER — Ambulatory Visit: Payer: Medicare Other | Admitting: Physical Therapy

## 2020-03-27 ENCOUNTER — Encounter: Payer: Self-pay | Admitting: Physical Therapy

## 2020-03-27 DIAGNOSIS — M6281 Muscle weakness (generalized): Secondary | ICD-10-CM

## 2020-03-27 DIAGNOSIS — M5412 Radiculopathy, cervical region: Secondary | ICD-10-CM

## 2020-03-27 DIAGNOSIS — M542 Cervicalgia: Secondary | ICD-10-CM

## 2020-03-27 NOTE — Therapy (Signed)
[place], [place], [address] Phone: [phone]   Fax:  [phone]  Physical Therapy Treatment  Patient Details  Name: Lori Alvarez MRN: [id number] Date of Birth: 07-30-1961 Referring Provider (PT): Elsner   Encounter Date: 03/27/2020  PT End of Session - 03/27/20 1611    Visit Number  40    Date for PT Re-Evaluation  03/28/20    PT Start Time  1025    PT Stop Time  1520    PT Time Calculation (min)  43 min    Activity Tolerance  Patient tolerated treatment well    Behavior During Therapy  [facility] for tasks assessed/performed       Past Medical History:  Diagnosis Date  . ALS (amyotrophic lateral sclerosis) ([place])   . Postherpetic neuralgia     Past Surgical History:  Procedure Laterality Date  . APPENDECTOMY    . PARTIAL HYSTERECTOMY      There were no vitals filed for this visit.  Subjective Assessment - 03/27/20 1440    Patient Stated Goals  have less pain, less numbness, relief    Currently in Pain?  Yes    Pain Score  7     Pain Location  Neck    Aggravating Factors   I have been cramping in my stomach and I do not know if that causes me to tense up                        [facility] Adult PT Treatment/Exercise - 03/27/20 0001      Neck Exercises: Machines for Strengthening   Nustep  Level 4 x 6 minutes no arms    Other Machines for Strengthening  leg extension 5# 3x10, leg curls 20# x10, 15# x 10      Neck Exercises: Standing   Other Standing Exercises  hip 2.5# hip marching and hip abduction    Other Standing Exercises  scapular retraction      Neck Exercises: Seated   Other Seated Exercise  used computer cart to have her push and pull it with each hand, Bosu behind partial sit ups 2 x 10, had her do some writing with the right hand and drawing shapes with the left hand      Moist Heat Therapy   Number Minutes Moist Heat  15 Minutes    Moist Heat  Location  Cervical      Electrical Stimulation   Electrical Stimulation Location  upper trap and into the cervical area    Electrical Stimulation Action  IFC    Electrical Stimulation Parameters  seated     Electrical Stimulation Goals  Pain               PT Short Term Goals - 08/14/19 1426      PT SHORT TERM GOAL #1   Title  independent with initial HEP    Status  Achieved        PT Long Term Goals - 03/27/20 1613      PT LONG TERM GOAL #1   Title  report pain decreased 25%    Status  Partially Met            Plan - 03/27/20 1612    Clinical Impression Statement  Patient is reporting more stomach issues today and some pain int he neck.  She was surprised that she was able to write with her  hands, she found the hip exercises to be very difficult.    PT Next Visit Plan  renew next visit       Patient will benefit from skilled therapeutic intervention in order to improve the following deficits and impairments:  Pain, Improper body mechanics, Impaired sensation, Increased muscle spasms, Postural dysfunction, Impaired tone, Impaired UE functional use, Decreased strength, Decreased range of motion, Decreased activity tolerance, Impaired flexibility  Visit Diagnosis: Muscle weakness (generalized)  Cervicalgia  Radiculopathy, cervical region     Problem List Patient Active Problem List   Diagnosis Date Noted  . Abdominal pain 03/01/2020  . Small bowel obstruction ([place]) 12/06/2019  . SBO (small bowel obstruction) ([place]) 12/06/2019  . Collagenous colitis 07/28/2018  . Decreased sensation of lower extremity 07/20/2018  . ALS (amyotrophic lateral sclerosis) ([place]) 12/23/2017  . Muscle weakness (generalized) 11/23/2017  . Bilateral arm weakness 11/11/2017  . Elevated blood pressure reading without diagnosis of hypertension 11/11/2017  . Post herpetic neuralgia 11/11/2017  . MDS (myelodysplastic syndrome) ([place]) 01/22/2015    Sumner Boast.,  PT 03/27/2020, 4:13 PM  [address] [address] [place], [place], [address] Phone: [phone]   Fax:  [phone]  Name: Lori Alvarez MRN: [id number] Date of Birth: 07/21/1961

## 2020-03-29 ENCOUNTER — Telehealth: Payer: Self-pay

## 2020-03-29 NOTE — Telephone Encounter (Signed)
(  11:07am) SW received a call from patient. She advised that she was following up from SW's conversation with her friend-Dr. Maylon Peppers. She stated that she was sharing with him the recent changes in her condition, hospitalization and her increased care needs and he mentioned hospice to her. Patient reported that she is weaker, using her lift chair more to get up, but is still ambulatory, had a hospitalization in February and March, loss of dexterity and need more assistance with ADL's. She stated that her husband is her primary caregiver, with his own health issues and it is getting to be too much for him. SW empathized with patient and advised her that she had spoken to the palliative care RN and medical director about her case and it seems that right now she is not appropriate for hospice care. SW advised that her current status is that she remains ambulatory,is still actively participating in physical therapy and she is able to eat with assistance and without aspiration-she is not quite ready for hospice yet.  SW inquired if she had followed up on the in-home care agency and private sitter resources that were provided to her by SW. She stated no she had not. SW also reminded her of the ALS grant application that SW sent to her that could assist financially with getting in-home help and she stated that she had not, because she did not have time because of her hospitalizations. A chaplain also reached out to patient at her request that she did not follow-up on either.  SW advised patient that she will also have the palliative care RN to follow-up with her regarding her desire to be referred to hospice and discuss this further. The patient asked if the team needed to make a visit to determine hospice eligibility. SW advised her that the team would like to meet with her to observe changes, however she could not promise that the visit will result in a hospice referral. SW advised that she will have the nurse to  follow-up with her and to schedule a visit as this SW will be out of the office after Wednesday for a week.  Palliative Care RN updated for follow-up.  Duration of call and documentation: 25 minutes.

## 2020-03-30 ENCOUNTER — Encounter (HOSPITAL_COMMUNITY): Payer: Self-pay | Admitting: Radiology

## 2020-03-30 ENCOUNTER — Emergency Department (HOSPITAL_COMMUNITY): Payer: Medicare Other

## 2020-03-30 ENCOUNTER — Emergency Department (HOSPITAL_COMMUNITY): Payer: Medicare Other | Admitting: Certified Registered Nurse Anesthetist

## 2020-03-30 ENCOUNTER — Inpatient Hospital Stay (HOSPITAL_COMMUNITY)
Admission: EM | Admit: 2020-03-30 | Discharge: 2020-04-02 | DRG: 329 | Disposition: A | Discharge status: Hospice | Payer: Medicare Other | Attending: Family Medicine | Admitting: Family Medicine

## 2020-03-30 ENCOUNTER — Other Ambulatory Visit: Payer: Self-pay

## 2020-03-30 ENCOUNTER — Encounter (HOSPITAL_COMMUNITY)
Admission: EM | Disposition: A | Discharge status: Hospice | Payer: Self-pay | Source: Home / Self Care | Attending: Family Medicine

## 2020-03-30 DIAGNOSIS — K668 Other specified disorders of peritoneum: Secondary | ICD-10-CM | POA: Diagnosis present

## 2020-03-30 DIAGNOSIS — G43001 Migraine without aura, not intractable, with status migrainosus: Secondary | ICD-10-CM

## 2020-03-30 DIAGNOSIS — R079 Chest pain, unspecified: Secondary | ICD-10-CM | POA: Diagnosis not present

## 2020-03-30 DIAGNOSIS — K5669 Other partial intestinal obstruction: Secondary | ICD-10-CM | POA: Diagnosis not present

## 2020-03-30 DIAGNOSIS — Z791 Long term (current) use of non-steroidal anti-inflammatories (NSAID): Secondary | ICD-10-CM

## 2020-03-30 DIAGNOSIS — R52 Pain, unspecified: Secondary | ICD-10-CM | POA: Diagnosis not present

## 2020-03-30 DIAGNOSIS — Z515 Encounter for palliative care: Secondary | ICD-10-CM

## 2020-03-30 DIAGNOSIS — R5381 Other malaise: Secondary | ICD-10-CM | POA: Diagnosis not present

## 2020-03-30 DIAGNOSIS — R531 Weakness: Secondary | ICD-10-CM

## 2020-03-30 DIAGNOSIS — K265 Chronic or unspecified duodenal ulcer with perforation: Principal | ICD-10-CM

## 2020-03-30 DIAGNOSIS — B0229 Other postherpetic nervous system involvement: Secondary | ICD-10-CM | POA: Diagnosis present

## 2020-03-30 DIAGNOSIS — F419 Anxiety disorder, unspecified: Secondary | ICD-10-CM | POA: Diagnosis present

## 2020-03-30 DIAGNOSIS — R208 Other disturbances of skin sensation: Secondary | ICD-10-CM | POA: Diagnosis not present

## 2020-03-30 DIAGNOSIS — M6281 Muscle weakness (generalized): Secondary | ICD-10-CM

## 2020-03-30 DIAGNOSIS — Z66 Do not resuscitate: Secondary | ICD-10-CM | POA: Diagnosis present

## 2020-03-30 DIAGNOSIS — R109 Unspecified abdominal pain: Secondary | ICD-10-CM | POA: Diagnosis not present

## 2020-03-30 DIAGNOSIS — K660 Peritoneal adhesions (postprocedural) (postinfection): Secondary | ICD-10-CM | POA: Diagnosis present

## 2020-03-30 DIAGNOSIS — R188 Other ascites: Secondary | ICD-10-CM | POA: Diagnosis present

## 2020-03-30 DIAGNOSIS — R29898 Other symptoms and signs involving the musculoskeletal system: Secondary | ICD-10-CM

## 2020-03-30 DIAGNOSIS — R42 Dizziness and giddiness: Secondary | ICD-10-CM | POA: Diagnosis not present

## 2020-03-30 DIAGNOSIS — K52831 Collagenous colitis: Secondary | ICD-10-CM | POA: Diagnosis not present

## 2020-03-30 DIAGNOSIS — E538 Deficiency of other specified B group vitamins: Secondary | ICD-10-CM | POA: Diagnosis present

## 2020-03-30 DIAGNOSIS — R634 Abnormal weight loss: Secondary | ICD-10-CM | POA: Diagnosis not present

## 2020-03-30 DIAGNOSIS — K650 Generalized (acute) peritonitis: Secondary | ICD-10-CM | POA: Diagnosis present

## 2020-03-30 DIAGNOSIS — G43909 Migraine, unspecified, not intractable, without status migrainosus: Secondary | ICD-10-CM | POA: Diagnosis present

## 2020-03-30 DIAGNOSIS — R1084 Generalized abdominal pain: Secondary | ICD-10-CM | POA: Diagnosis not present

## 2020-03-30 DIAGNOSIS — R110 Nausea: Secondary | ICD-10-CM | POA: Diagnosis not present

## 2020-03-30 DIAGNOSIS — R1013 Epigastric pain: Secondary | ICD-10-CM | POA: Diagnosis not present

## 2020-03-30 DIAGNOSIS — K56699 Other intestinal obstruction unspecified as to partial versus complete obstruction: Secondary | ICD-10-CM | POA: Diagnosis not present

## 2020-03-30 DIAGNOSIS — D469 Myelodysplastic syndrome, unspecified: Secondary | ICD-10-CM | POA: Diagnosis present

## 2020-03-30 DIAGNOSIS — Z7189 Other specified counseling: Secondary | ICD-10-CM | POA: Diagnosis not present

## 2020-03-30 DIAGNOSIS — Z90711 Acquired absence of uterus with remaining cervical stump: Secondary | ICD-10-CM

## 2020-03-30 DIAGNOSIS — R1010 Upper abdominal pain, unspecified: Secondary | ICD-10-CM | POA: Diagnosis not present

## 2020-03-30 DIAGNOSIS — Z79899 Other long term (current) drug therapy: Secondary | ICD-10-CM

## 2020-03-30 DIAGNOSIS — M2550 Pain in unspecified joint: Secondary | ICD-10-CM | POA: Diagnosis not present

## 2020-03-30 DIAGNOSIS — Z7401 Bed confinement status: Secondary | ICD-10-CM | POA: Diagnosis not present

## 2020-03-30 DIAGNOSIS — G1221 Amyotrophic lateral sclerosis: Secondary | ICD-10-CM | POA: Diagnosis not present

## 2020-03-30 DIAGNOSIS — Z20822 Contact with and (suspected) exposure to covid-19: Secondary | ICD-10-CM | POA: Diagnosis present

## 2020-03-30 DIAGNOSIS — R000 Tachycardia, unspecified: Secondary | ICD-10-CM | POA: Diagnosis not present

## 2020-03-30 DIAGNOSIS — K529 Noninfective gastroenteritis and colitis, unspecified: Secondary | ICD-10-CM | POA: Diagnosis not present

## 2020-03-30 DIAGNOSIS — R0902 Hypoxemia: Secondary | ICD-10-CM | POA: Diagnosis not present

## 2020-03-30 HISTORY — PX: LAPAROTOMY: SHX154

## 2020-03-30 LAB — CBC WITH DIFFERENTIAL/PLATELET
Abs Immature Granulocytes: 0.05 10*3/uL (ref 0.00–0.07)
Basophils Absolute: 0 10*3/uL (ref 0.0–0.1)
Basophils Relative: 0 %
Eosinophils Absolute: 0 10*3/uL (ref 0.0–0.5)
Eosinophils Relative: 0 %
HCT: 39.4 % (ref 36.0–46.0)
Hemoglobin: 12.2 g/dL (ref 12.0–15.0)
Immature Granulocytes: 0 %
Lymphocytes Relative: 9 %
Lymphs Abs: 1.2 10*3/uL (ref 0.7–4.0)
MCH: 28.6 pg (ref 26.0–34.0)
MCHC: 31 g/dL (ref 30.0–36.0)
MCV: 92.5 fL (ref 80.0–100.0)
Monocytes Absolute: 0.4 10*3/uL (ref 0.1–1.0)
Monocytes Relative: 3 %
Neutro Abs: 11.3 10*3/uL — ABNORMAL HIGH (ref 1.7–7.7)
Neutrophils Relative %: 88 %
Platelets: 436 10*3/uL — ABNORMAL HIGH (ref 150–400)
RBC: 4.26 MIL/uL (ref 3.87–5.11)
RDW: 16.3 % — ABNORMAL HIGH (ref 11.5–15.5)
WBC: 12.8 10*3/uL — ABNORMAL HIGH (ref 4.0–10.5)
nRBC: 0 % (ref 0.0–0.2)

## 2020-03-30 LAB — GLUCOSE, CAPILLARY: Glucose-Capillary: 125 mg/dL — ABNORMAL HIGH (ref 70–99)

## 2020-03-30 LAB — COMPREHENSIVE METABOLIC PANEL
ALT: 12 U/L (ref 0–44)
AST: 17 U/L (ref 15–41)
Albumin: 3.5 g/dL (ref 3.5–5.0)
Alkaline Phosphatase: 75 U/L (ref 38–126)
Anion gap: 10 (ref 5–15)
BUN: 13 mg/dL (ref 6–20)
CO2: 25 mmol/L (ref 22–32)
Calcium: 8.8 mg/dL — ABNORMAL LOW (ref 8.9–10.3)
Chloride: 106 mmol/L (ref 98–111)
Creatinine, Ser: 0.45 mg/dL (ref 0.44–1.00)
GFR calc Af Amer: >60 mL/min (ref >60)
GFR calc non Af Amer: >60 mL/min (ref >60)
Glucose, Bld: 124 mg/dL — ABNORMAL HIGH (ref 70–99)
Potassium: 4.4 mmol/L (ref 3.5–5.1)
Sodium: 141 mmol/L (ref 135–145)
Total Bilirubin: 0.6 mg/dL (ref 0.3–1.2)
Total Protein: 6.4 g/dL — ABNORMAL LOW (ref 6.5–8.1)

## 2020-03-30 LAB — TROPONIN I (HIGH SENSITIVITY)
Troponin I (High Sensitivity): <2 ng/L (ref <18)
Troponin I (High Sensitivity): 2 ng/L (ref <18)

## 2020-03-30 LAB — I-STAT BETA HCG BLOOD, ED (MC, WL, AP ONLY): I-stat hCG, quantitative: <5 m[IU]/mL (ref <5)

## 2020-03-30 LAB — TYPE AND SCREEN
ABO/RH(D): O NEG
Antibody Screen: NEGATIVE

## 2020-03-30 LAB — SURGICAL PCR SCREEN
MRSA, PCR: NEGATIVE
Staphylococcus aureus: NEGATIVE

## 2020-03-30 LAB — SARS CORONAVIRUS 2 BY RT PCR (HOSPITAL ORDER, PERFORMED IN ~~LOC~~ HOSPITAL LAB): SARS Coronavirus 2: NEGATIVE

## 2020-03-30 LAB — LACTIC ACID, PLASMA: Lactic Acid, Venous: 1.4 mmol/L (ref 0.5–1.9)

## 2020-03-30 LAB — LIPASE, BLOOD: Lipase: 113 U/L — ABNORMAL HIGH (ref 11–51)

## 2020-03-30 SURGERY — LAPAROTOMY, EXPLORATORY
Anesthesia: General | Site: Abdomen

## 2020-03-30 MED ORDER — PHENYLEPHRINE 40 MCG/ML (10ML) SYRINGE FOR IV PUSH (FOR BLOOD PRESSURE SUPPORT)
PREFILLED_SYRINGE | INTRAVENOUS | Status: AC
  Filled 2020-03-30: qty 30

## 2020-03-30 MED ORDER — HYDROCORTISONE (PERIANAL) 2.5 % EX CREA: 1.0000 "application " | TOPICAL_CREAM | Freq: Four times a day (QID) | CUTANEOUS | Status: DC | PRN

## 2020-03-30 MED ORDER — MUPIROCIN 2 % EX OINT
1.0000 "application " | TOPICAL_OINTMENT | Freq: Two times a day (BID) | CUTANEOUS | Status: DC
  Administered 2020-03-31 – 2020-04-01 (×4): 1 via NASAL
  Filled 2020-03-30 (×3): qty 22

## 2020-03-30 MED ORDER — BUPIVACAINE LIPOSOME 1.3 % IJ SUSP
INTRAMUSCULAR | Status: DC | PRN
  Administered 2020-03-30: 20 mL

## 2020-03-30 MED ORDER — ONDANSETRON HCL 4 MG/2ML IJ SOLN
4.0000 mg | Freq: Once | INTRAMUSCULAR | Status: AC
  Administered 2020-03-30: 4 mg via INTRAVENOUS
  Filled 2020-03-30: qty 2

## 2020-03-30 MED ORDER — SODIUM CHLORIDE 0.9% IV SOLUTION: Freq: Once | INTRAVENOUS | Status: DC

## 2020-03-30 MED ORDER — ALBUMIN HUMAN 5 % IV SOLN
12.5000 g | Freq: Once | INTRAVENOUS | Status: AC
  Administered 2020-03-30: 12.5 g via INTRAVENOUS

## 2020-03-30 MED ORDER — LACTATED RINGERS IV SOLN
INTRAVENOUS | Status: DC | PRN
Start: 2020-03-30 — End: 2020-03-30

## 2020-03-30 MED ORDER — METHOCARBAMOL 1000 MG/10ML IJ SOLN
500.0000 mg | Freq: Three times a day (TID) | INTRAVENOUS | Status: AC
  Administered 2020-03-30 – 2020-04-01 (×6): 500 mg via INTRAVENOUS
  Filled 2020-03-30 (×6): qty 500

## 2020-03-30 MED ORDER — SODIUM CHLORIDE 0.9 % IV BOLUS
500.0000 mL | Freq: Once | INTRAVENOUS | Status: AC
  Administered 2020-03-30: 500 mL via INTRAVENOUS

## 2020-03-30 MED ORDER — MENTHOL 3 MG MT LOZG: 1.0000 | LOZENGE | OROMUCOSAL | Status: DC | PRN

## 2020-03-30 MED ORDER — BUPIVACAINE HCL 0.25 % IJ SOLN
INTRAMUSCULAR | Status: DC | PRN
  Administered 2020-03-30: 20 mL

## 2020-03-30 MED ORDER — SUGAMMADEX SODIUM 200 MG/2ML IV SOLN
INTRAVENOUS | Status: DC | PRN
  Administered 2020-03-30: 200 mg via INTRAVENOUS

## 2020-03-30 MED ORDER — CLINDAMYCIN PHOSPHATE 900 MG/50ML IV SOLN
900.0000 mg | INTRAVENOUS | Status: DC
  Filled 2020-03-30: qty 50

## 2020-03-30 MED ORDER — LACTATED RINGERS IV BOLUS: 1000.0000 mL | Freq: Four times a day (QID) | INTRAVENOUS | Status: AC | PRN

## 2020-03-30 MED ORDER — ONDANSETRON HCL 4 MG/2ML IJ SOLN
INTRAMUSCULAR | Status: AC
  Filled 2020-03-30: qty 2

## 2020-03-30 MED ORDER — ACETAMINOPHEN 650 MG RE SUPP: 650.0000 mg | Freq: Four times a day (QID) | RECTAL | Status: DC | PRN

## 2020-03-30 MED ORDER — IOHEXOL 350 MG/ML SOLN
100.0000 mL | Freq: Once | INTRAVENOUS | Status: AC | PRN
  Administered 2020-03-30: 100 mL via INTRAVENOUS

## 2020-03-30 MED ORDER — PHENYLEPHRINE HCL-NACL 10-0.9 MG/250ML-% IV SOLN
INTRAVENOUS | Status: DC | PRN
Start: 2020-03-30 — End: 2020-03-30
  Administered 2020-03-30: 40 ug/min via INTRAVENOUS

## 2020-03-30 MED ORDER — LORAZEPAM 2 MG/ML IJ SOLN
0.5000 mg | Freq: Three times a day (TID) | INTRAMUSCULAR | Status: DC | PRN
  Administered 2020-03-30: 0.5 mg via INTRAVENOUS
  Administered 2020-03-31 – 2020-04-01 (×2): 1 mg via INTRAVENOUS
  Filled 2020-03-30 (×3): qty 1

## 2020-03-30 MED ORDER — PHENYLEPHRINE 40 MCG/ML (10ML) SYRINGE FOR IV PUSH (FOR BLOOD PRESSURE SUPPORT)
PREFILLED_SYRINGE | INTRAVENOUS | Status: DC | PRN
  Administered 2020-03-30: 120 ug via INTRAVENOUS
  Administered 2020-03-30 (×4): 80 ug via INTRAVENOUS
  Administered 2020-03-30: 120 ug via INTRAVENOUS

## 2020-03-30 MED ORDER — MIDAZOLAM HCL 5 MG/5ML IJ SOLN
INTRAMUSCULAR | Status: DC | PRN
  Administered 2020-03-30: 2 mg via INTRAVENOUS

## 2020-03-30 MED ORDER — CHLORHEXIDINE GLUCONATE CLOTH 2 % EX PADS
6.0000 | MEDICATED_PAD | Freq: Once | CUTANEOUS | Status: AC
  Administered 2020-03-30: 6 via TOPICAL

## 2020-03-30 MED ORDER — MAGIC MOUTHWASH
15.0000 mL | Freq: Four times a day (QID) | ORAL | Status: DC | PRN
  Filled 2020-03-30: qty 15

## 2020-03-30 MED ORDER — BUPIVACAINE HCL 0.25 % IJ SOLN
INTRAMUSCULAR | Status: AC
  Filled 2020-03-30: qty 1

## 2020-03-30 MED ORDER — LIP MEDEX EX OINT
1.0000 "application " | TOPICAL_OINTMENT | Freq: Two times a day (BID) | CUTANEOUS | Status: DC
  Administered 2020-03-31 – 2020-04-02 (×4): 1 via TOPICAL
  Filled 2020-03-30 (×2): qty 7

## 2020-03-30 MED ORDER — ACETAMINOPHEN 10 MG/ML IV SOLN
INTRAVENOUS | Status: AC
  Filled 2020-03-30: qty 100

## 2020-03-30 MED ORDER — LIDOCAINE 2% (20 MG/ML) 5 ML SYRINGE
INTRAMUSCULAR | Status: DC | PRN
  Administered 2020-03-30: 40 mg via INTRAVENOUS

## 2020-03-30 MED ORDER — SODIUM CHLORIDE 0.9 % IV SOLN
80.0000 mg | Freq: Once | INTRAVENOUS | Status: AC
  Administered 2020-03-30: 80 mg via INTRAVENOUS
  Filled 2020-03-30: qty 80

## 2020-03-30 MED ORDER — FENTANYL CITRATE (PF) 100 MCG/2ML IJ SOLN: 25.0000 ug | INTRAMUSCULAR | Status: DC | PRN

## 2020-03-30 MED ORDER — ACETAMINOPHEN 10 MG/ML IV SOLN
1000.0000 mg | Freq: Once | INTRAVENOUS | Status: AC
  Administered 2020-03-30: 1000 mg via INTRAVENOUS

## 2020-03-30 MED ORDER — ROCURONIUM BROMIDE 10 MG/ML (PF) SYRINGE
PREFILLED_SYRINGE | INTRAVENOUS | Status: DC | PRN
  Administered 2020-03-30: 70 mg via INTRAVENOUS

## 2020-03-30 MED ORDER — SODIUM CHLORIDE 0.9 % IV SOLN
INTRAVENOUS | Status: DC | PRN
  Administered 2020-03-30: 1000 mL

## 2020-03-30 MED ORDER — CHLORHEXIDINE GLUCONATE CLOTH 2 % EX PADS
6.0000 | MEDICATED_PAD | Freq: Every day | CUTANEOUS | Status: DC
  Administered 2020-03-31: 6 via TOPICAL

## 2020-03-30 MED ORDER — ONDANSETRON HCL 4 MG/2ML IJ SOLN
INTRAMUSCULAR | Status: DC | PRN
  Administered 2020-03-30: 4 mg via INTRAVENOUS

## 2020-03-30 MED ORDER — 0.9 % SODIUM CHLORIDE (POUR BTL) OPTIME
TOPICAL | Status: DC | PRN
  Administered 2020-03-30: 8000 mL

## 2020-03-30 MED ORDER — SODIUM CHLORIDE 0.9 % IV SOLN
INTRAVENOUS | Status: DC
  Filled 2020-03-30: qty 6

## 2020-03-30 MED ORDER — HYDROMORPHONE HCL 1 MG/ML IJ SOLN
0.5000 mg | INTRAMUSCULAR | Status: DC | PRN
  Administered 2020-03-31 (×2): 0.5 mg via INTRAVENOUS
  Filled 2020-03-30 (×2): qty 1

## 2020-03-30 MED ORDER — PROPOFOL 10 MG/ML IV BOLUS
INTRAVENOUS | Status: AC
  Filled 2020-03-30: qty 20

## 2020-03-30 MED ORDER — GUAIFENESIN-DM 100-10 MG/5ML PO SYRP: 10.0000 mL | ORAL_SOLUTION | ORAL | Status: DC | PRN

## 2020-03-30 MED ORDER — SODIUM CHLORIDE 0.9 % IV SOLN
8.0000 mg/h | INTRAVENOUS | Status: DC
  Administered 2020-03-30 – 2020-04-01 (×4): 8 mg/h via INTRAVENOUS
  Filled 2020-03-30 (×8): qty 80

## 2020-03-30 MED ORDER — SODIUM CHLORIDE (PF) 0.9 % IJ SOLN
INTRAMUSCULAR | Status: AC
  Filled 2020-03-30: qty 50

## 2020-03-30 MED ORDER — PROPOFOL 10 MG/ML IV BOLUS
INTRAVENOUS | Status: DC | PRN
  Administered 2020-03-30: 100 mg via INTRAVENOUS

## 2020-03-30 MED ORDER — FENTANYL CITRATE (PF) 250 MCG/5ML IJ SOLN
INTRAMUSCULAR | Status: AC
  Filled 2020-03-30: qty 5

## 2020-03-30 MED ORDER — PIPERACILLIN-TAZOBACTAM 3.375 G IVPB
3.3750 g | Freq: Once | INTRAVENOUS | Status: AC
  Administered 2020-03-30: 3.375 g via INTRAVENOUS
  Filled 2020-03-30: qty 50

## 2020-03-30 MED ORDER — ALBUMIN HUMAN 5 % IV SOLN
INTRAVENOUS | Status: AC
  Filled 2020-03-30: qty 250

## 2020-03-30 MED ORDER — BISACODYL 10 MG RE SUPP: 10.0000 mg | Freq: Two times a day (BID) | RECTAL | Status: DC | PRN

## 2020-03-30 MED ORDER — DIPHENHYDRAMINE HCL 50 MG/ML IJ SOLN
INTRAMUSCULAR | Status: DC | PRN
  Administered 2020-03-30: 12.5 mg via INTRAVENOUS

## 2020-03-30 MED ORDER — MORPHINE SULFATE (PF) 4 MG/ML IV SOLN
4.0000 mg | Freq: Once | INTRAVENOUS | Status: AC
  Administered 2020-03-30: 4 mg via INTRAVENOUS
  Filled 2020-03-30: qty 1

## 2020-03-30 MED ORDER — PANTOPRAZOLE SODIUM 40 MG IV SOLR: 40.0000 mg | Freq: Two times a day (BID) | INTRAVENOUS | Status: DC

## 2020-03-30 MED ORDER — SODIUM CHLORIDE 0.9 % IV SOLN: INTRAVENOUS | Status: DC

## 2020-03-30 MED ORDER — GENTAMICIN SULFATE 40 MG/ML IJ SOLN
5.0000 mg/kg | INTRAVENOUS | Status: DC
  Filled 2020-03-30: qty 6.75

## 2020-03-30 MED ORDER — MIDAZOLAM HCL 2 MG/2ML IJ SOLN
INTRAMUSCULAR | Status: AC
  Filled 2020-03-30: qty 2

## 2020-03-30 MED ORDER — FENTANYL CITRATE (PF) 100 MCG/2ML IJ SOLN
50.0000 ug | Freq: Once | INTRAMUSCULAR | Status: AC
  Administered 2020-03-30: 50 ug via INTRAVENOUS
  Filled 2020-03-30: qty 2

## 2020-03-30 MED ORDER — ALUM & MAG HYDROXIDE-SIMETH 200-200-20 MG/5ML PO SUSP: 30.0000 mL | Freq: Four times a day (QID) | ORAL | Status: DC | PRN

## 2020-03-30 MED ORDER — ACETAMINOPHEN 10 MG/ML IV SOLN
1000.0000 mg | Freq: Four times a day (QID) | INTRAVENOUS | Status: AC
  Administered 2020-03-31 (×4): 1000 mg via INTRAVENOUS
  Filled 2020-03-30 (×4): qty 100

## 2020-03-30 MED ORDER — DEXAMETHASONE SODIUM PHOSPHATE 10 MG/ML IJ SOLN
INTRAMUSCULAR | Status: DC | PRN
  Administered 2020-03-30: 5 mg via INTRAVENOUS

## 2020-03-30 MED ORDER — ALBUMIN HUMAN 5 % IV SOLN: INTRAVENOUS | Status: DC | PRN

## 2020-03-30 MED ORDER — FENTANYL CITRATE (PF) 100 MCG/2ML IJ SOLN
INTRAMUSCULAR | Status: DC | PRN
  Administered 2020-03-30 (×2): 50 ug via INTRAVENOUS

## 2020-03-30 MED ORDER — PHENOL 1.4 % MT LIQD: 1.0000 | OROMUCOSAL | Status: DC | PRN

## 2020-03-30 MED ORDER — BUPIVACAINE LIPOSOME 1.3 % IJ SUSP
20.0000 mL | INTRAMUSCULAR | Status: DC
  Filled 2020-03-30: qty 20

## 2020-03-30 MED ORDER — HYDROCORTISONE 1 % EX CREA: 1.0000 "application " | TOPICAL_CREAM | Freq: Three times a day (TID) | CUTANEOUS | Status: DC | PRN

## 2020-03-30 SURGICAL SUPPLY — 56 items
BLADE EXTENDED COATED 6.5IN (ELECTRODE) IMPLANT
BLADE HEX COATED 2.75 (ELECTRODE) IMPLANT
CANISTER WOUNDNEG PRESSURE 500 (CANNISTER) ×3 IMPLANT
CHLORAPREP W/TINT 26 (MISCELLANEOUS) ×3 IMPLANT
COUNTER NEEDLE 20 DBL MAG RED (NEEDLE) ×3 IMPLANT
COVER MAYO STAND STRL (DRAPES) ×3 IMPLANT
COVER SURGICAL LIGHT HANDLE (MISCELLANEOUS) ×3 IMPLANT
COVER WAND RF STERILE (DRAPES) IMPLANT
DRAIN CHANNEL 19F RND (DRAIN) ×3 IMPLANT
DRAPE LAPAROSCOPIC ABDOMINAL (DRAPES) ×3 IMPLANT
DRAPE SHEET LG 3/4 BI-LAMINATE (DRAPES) ×3 IMPLANT
DRAPE UTILITY XL STRL (DRAPES) ×3 IMPLANT
DRAPE WARM FLUID 44X44 (DRAPES) ×3 IMPLANT
DRSG OPSITE POSTOP 4X10 (GAUZE/BANDAGES/DRESSINGS) IMPLANT
DRSG OPSITE POSTOP 4X6 (GAUZE/BANDAGES/DRESSINGS) IMPLANT
DRSG OPSITE POSTOP 4X8 (GAUZE/BANDAGES/DRESSINGS) IMPLANT
DRSG VAC ATS SM SENSATRAC (GAUZE/BANDAGES/DRESSINGS) ×3 IMPLANT
ELECT REM PT RETURN 15FT ADLT (MISCELLANEOUS) ×3 IMPLANT
EVACUATOR SILICONE 100CC (DRAIN) ×3 IMPLANT
GAUZE SPONGE 4X4 12PLY STRL (GAUZE/BANDAGES/DRESSINGS) ×3 IMPLANT
GLOVE ECLIPSE 8.0 STRL XLNG CF (GLOVE) ×6 IMPLANT
GLOVE INDICATOR 8.0 STRL GRN (GLOVE) ×6 IMPLANT
GOWN STRL REUS W/TWL XL LVL3 (GOWN DISPOSABLE) ×6 IMPLANT
HANDLE SUCTION POOLE (INSTRUMENTS) ×1 IMPLANT
KIT BASIN (CUSTOM PROCEDURE TRAY) ×3 IMPLANT
KIT TURNOVER KIT A (KITS) IMPLANT
LEGGING LITHOTOMY PAIR STRL (DRAPES) IMPLANT
LIGASURE IMPACT 36 18CM CVD LR (INSTRUMENTS) ×3 IMPLANT
PACK GENERAL/GYN (CUSTOM PROCEDURE TRAY) ×3 IMPLANT
PAD POSITIONING PINK XL (MISCELLANEOUS) ×3 IMPLANT
PENCIL SMOKE EVACUATOR (MISCELLANEOUS) IMPLANT
RETRACTOR WND ALEXIS 25 LRG (MISCELLANEOUS) ×1 IMPLANT
RTRCTR WOUND ALEXIS 25CM LRG (MISCELLANEOUS) ×3
STAPLER 90 3.5 STAND SLIM (STAPLE) ×3
STAPLER 90 3.5 STD SLIM (STAPLE) ×1 IMPLANT
STAPLER PROXIMATE 75MM BLUE (STAPLE) ×3 IMPLANT
STAPLER VISISTAT 35W (STAPLE) ×3 IMPLANT
SUCTION POOLE HANDLE (INSTRUMENTS) ×3
SUT MNCRL AB 4-0 PS2 18 (SUTURE) IMPLANT
SUT PDS AB 1 CTX 36 (SUTURE) ×9 IMPLANT
SUT PDS AB 1 TP1 96 (SUTURE) IMPLANT
SUT PDS AB 2-0 CT2 27 (SUTURE) ×9 IMPLANT
SUT PROLENE 2 0 CT 1 (SUTURE) ×3 IMPLANT
SUT PROLENE 2 0 SH DA (SUTURE) IMPLANT
SUT SILK 2 0 (SUTURE) ×3
SUT SILK 2 0 SH CR/8 (SUTURE) ×3 IMPLANT
SUT SILK 2-0 18XBRD TIE 12 (SUTURE) ×1 IMPLANT
SUT SILK 3 0 (SUTURE) ×3
SUT SILK 3 0 SH CR/8 (SUTURE) ×3 IMPLANT
SUT SILK 3-0 18XBRD TIE 12 (SUTURE) ×1 IMPLANT
SUT VICRYL 0 UR6 27IN ABS (SUTURE) IMPLANT
TAPE UMBILICAL COTTON 1/8X30 (MISCELLANEOUS) IMPLANT
TOWEL OR 17X26 10 PK STRL BLUE (TOWEL DISPOSABLE) ×3 IMPLANT
TOWEL OR NON WOVEN STRL DISP B (DISPOSABLE) ×3 IMPLANT
TRAY FOLEY MTR SLVR 16FR STAT (SET/KITS/TRAYS/PACK) ×3 IMPLANT
YANKAUER SUCT BULB TIP 10FT TU (MISCELLANEOUS) ×3 IMPLANT

## 2020-03-30 NOTE — Transfer of Care (Signed)
Immediate Anesthesia Transfer of Care Note  Patient: Lori Alvarez  Procedure(s) Performed: EXPLORATORY LAPAROTOMY omental gram patch small bowel resection (N/A Abdomen)  Patient Location: PACU  Anesthesia Type:General  Level of Consciousness: drowsy and patient cooperative  Airway & Oxygen Therapy: Patient Spontanous Breathing and Patient connected to face mask oxygen  Post-op Assessment: Report given to RN and Post -op Vital signs reviewed and stable  Post vital signs: Reviewed and stable  Last Vitals:  Vitals Value Taken Time  BP 112/62 03/30/20 1900  Temp    Pulse 120 03/30/20 1903  Resp 16 03/30/20 1903  SpO2 100 % 03/30/20 1903  Vitals shown include unvalidated device data.  Last Pain:  Vitals:   03/30/20 1617  TempSrc:   PainSc: 7          Complications: No complications documented.

## 2020-03-30 NOTE — ED Provider Notes (Signed)
[facility] DEPT Provider Note   CSN: [id number] Arrival date & time: 03/30/20  1010     History Chief Complaint  Patient presents with  . Abdominal Pain    Lori Alvarez is a 59 y.o. female history of ALS, SBO, MDS, appendectomy, partial hysterectomy.  Patient presents today with right-sided chest pain onset around 8 AM this morning after waking up described as a sharp pain occasionally radiating to the center of her chest and her left side of her back, pain is moderate-severe constant no alleviating or aggravating factors.  Additionally patient reports abdominal pain which has been ongoing since May 14, she reports at that time she was diagnosed with a partial small bowel obstruction.  She reports lingering abdominal pain since that time, generalized aching sharp constant no clear aggravating or alleviating factors.  Denies fevers/chills, cough/hemoptysis, vomiting, diarrhea, dysuria/hematuria, fall/injury or any additional concerns.  HPI     Past Medical History:  Diagnosis Date  . ALS (amyotrophic lateral sclerosis) ([place])   . Postherpetic neuralgia   . SBO (small bowel obstruction) ([place]) 12/06/2019    Patient Active Problem List   Diagnosis Date Noted  . Pneumoperitoneum 03/30/2020  . Abdominal pain 03/01/2020  . Small bowel obstruction ([place]) 12/06/2019  . Collagenous colitis 07/28/2018  . Decreased sensation of lower extremity 07/20/2018  . ALS (amyotrophic lateral sclerosis) ([place]) 12/23/2017  . Muscle weakness (generalized) 11/23/2017  . Bilateral arm weakness 11/11/2017  . Elevated blood pressure reading without diagnosis of hypertension 11/11/2017  . Post herpetic neuralgia 11/11/2017  . MDS (myelodysplastic syndrome) ([place]) 01/22/2015    Past Surgical History:  Procedure Laterality Date  . APPENDECTOMY    . PARTIAL HYSTERECTOMY       OB History   No obstetric history on file.     Family History  Problem Relation Age  of Onset  . Myopathy Mother   . Cancer Father        renal    Social History   Tobacco Use  . Smoking status: Never Smoker  . Smokeless tobacco: Never Used  Vaping Use  . Vaping Use: Never used  Substance Use Topics  . Alcohol use: No  . Drug use: No    Home Medications Prior to Admission medications   Medication Sig Start Date End Date Taking? Authorizing Provider  acetaminophen (TYLENOL) 500 MG tablet Take 500 mg by mouth in the morning, at noon, in the evening, and at bedtime.   Yes [provider]  ALPRAZolam (XANAX) 0.5 MG tablet Take 0.25-0.5 mg by mouth 2 (two) times daily. Take 1/2 tablet (0.25 mg) in the morning and Take 1 tablet (0.5 mg) at bedtime   Yes [provider]  baclofen (LIORESAL) 10 MG tablet Take 10 mg by mouth 4 (four) times daily.  09/16/19  Yes [provider]  Black Cohosh (REMIFEMIN) 20 MG TABS Take 20 mg by mouth every evening.    Yes [provider]  Cyanocobalamin (VITAMIN B-12 PO) Take 500 mcg by mouth daily.   Yes [provider]  DULoxetine (CYMBALTA) 20 MG capsule Take 20 mg by mouth every evening.  12/02/19  Yes [provider]  hyoscyamine (LEVBID) 0.375 MG 12 hr tablet Take 0.375 mg by mouth every 12 (twelve) hours.   Yes [provider]  melatonin 5 MG TABS Take 5 mg by mouth at bedtime.   Yes [provider]  meloxicam (MOBIC) 15 MG tablet Take 15 mg by mouth daily.  Yes [provider]  polycarbophil (FIBERCON) 625 MG tablet Take 625 mg by mouth daily.   Yes [provider]  pregabalin (LYRICA) 50 MG capsule Take 50-100 mg by mouth See admin instructions. Take 100 mg (2 capsules) at 10 am, take 50 mg (1 capsules) at 2 pm, take 100 mg (2 capsules) at 6 pm, and take 50 mg (1 capsule) at 10 pm. 12/02/19  Yes [provider]    Allergies    Allegra [fexofenadine], Clotrim-undecyl-tea tr-lav oil, Doxepin, Keflex [cephalexin], Nitroglycerin, Nitrofuran  derivatives, and Sulfa antibiotics  Review of Systems   Review of Systems Ten systems are reviewed and are negative for acute change except as noted in the HPI  Physical Exam Updated Vital Signs BP 114/65   Pulse (!) 118   Temp (!) 97.5 F (36.4 C) (Oral)   Resp (!) 22   Ht 5\' 3"  (1.6 m)   Wt 53.5 kg   SpO2 97%   BMI 20.90 kg/m   Physical Exam Constitutional:      General: She is not in acute distress.    Appearance: Normal appearance. She is well-developed. She is not ill-appearing or diaphoretic.  HENT:     Head: Normocephalic and atraumatic.  Eyes:     General: Vision grossly intact. Gaze aligned appropriately.     Pupils: Pupils are equal, round, and reactive to light.  Neck:     Trachea: Trachea and phonation normal.  Cardiovascular:     Rate and Rhythm: Normal rate and regular rhythm.  Pulmonary:     Effort: Pulmonary effort is normal. No respiratory distress.  Chest:     Chest wall: Tenderness present. No deformity.  Abdominal:     General: There is no distension.     Palpations: Abdomen is soft.     Tenderness: There is generalized abdominal tenderness. There is no guarding or rebound.  Musculoskeletal:        General: Normal range of motion.     Cervical back: Normal range of motion.  Skin:    General: Skin is warm and dry.  Neurological:     Mental Status: She is alert.     GCS: GCS eye subscore is 4. GCS verbal subscore is 5. GCS motor subscore is 6.     Comments: Speech is clear and goal oriented, follows commands Major Cranial nerves without deficit, no facial droop  Psychiatric:        Behavior: Behavior normal.    ED Results / Procedures / Treatments   Labs (all labs ordered are listed, but only abnormal results are displayed) Labs Reviewed  CBC WITH DIFFERENTIAL/PLATELET - Abnormal; Notable for the following components:      Result Value   WBC 12.8 (*)    RDW 16.3 (*)    Platelets 436 (*)    Neutro Abs 11.3 (*)    All other components  within normal limits  COMPREHENSIVE METABOLIC PANEL - Abnormal; Notable for the following components:   Glucose, Bld 124 (*)    Calcium 8.8 (*)    Total Protein 6.4 (*)    All other components within normal limits  LIPASE, BLOOD - Abnormal; Notable for the following components:   Lipase 113 (*)    All other components within normal limits  CULTURE, BLOOD (ROUTINE X 2)  CULTURE, BLOOD (ROUTINE X 2)  SARS CORONAVIRUS 2 BY RT PCR (HOSPITAL ORDER, [facility])  SURGICAL PCR SCREEN  LACTIC ACID, PLASMA  URINALYSIS,  ROUTINE W REFLEX MICROSCOPIC  LACTIC ACID, PLASMA  I-STAT BETA HCG BLOOD, ED ([facility], [facility], [facility] ONLY)  TROPONIN I (HIGH SENSITIVITY)  TROPONIN I (HIGH SENSITIVITY)    EKG EKG Interpretation  Date/Time:  Saturday March 30 2020 10:35:10 EDT Ventricular Rate:  88 PR Interval:    QRS Duration: 91 QT Interval:  381 QTC Calculation: 461 R Axis:   55 Text Interpretation: Sinus rhythm Abnormal R-wave progression, early transition Borderline T wave abnormalities Confirmed by Pattricia Boss [phone]) on 03/30/2020 10:49:28 AM   Radiology DG Abdomen Acute W/Chest  Result Date: 03/30/2020 CLINICAL DATA:  Epigastric pain radiating to right side. History of ALS. EXAM: DG ABDOMEN ACUTE W/ 1V CHEST COMPARISON:  Chest x-ray 09/05/2019 and abdominal films 03/03/2020 FINDINGS: Patient is slightly rotated to the right. Lungs are hypoinflated but otherwise clear. Cardiomediastinal silhouette and remainder of the chest is unremarkable. Abdominopelvic images demonstrate several air-filled dilated central small bowel loops measuring up to 4.2 cm in diameter. Mild amount of air and stool throughout the colon. Mild gastric distension. No free peritoneal air. Remainder the exam is unchanged. IMPRESSION: 1. Several dilated air-filled central small bowel loops with mild air throughout the colon. Findings may be seen and ileus versus early/partial small bowel obstruction. 2.  Hypoinflation  without acute cardiopulmonary disease. Electronically Signed   By: Marin Olp M.D.   On: 03/30/2020 12:52   CT Angio Chest/Abd/Pel for Dissection W and/or Wo Contrast  Result Date: 03/30/2020 CLINICAL DATA:  Epigastric pain radiating into right abdomen and back. History of ALS. EXAM: CT ANGIOGRAPHY CHEST, ABDOMEN AND PELVIS TECHNIQUE: Non-contrast CT of the chest was initially obtained. Multidetector CT imaging through the chest, abdomen and pelvis was performed using the standard protocol during bolus administration of intravenous contrast. Multiplanar reconstructed images and MIPs were obtained and reviewed to evaluate the vascular anatomy. CONTRAST:  120mL OMNIPAQUE IOHEXOL 350 MG/ML SOLN COMPARISON:  CT of the abdomen and pelvis on 03/01/2020 FINDINGS: CTA CHEST FINDINGS Cardiovascular: The thoracic aorta is normal in caliber and demonstrates no evidence of dissection. There is calcified plaque at the level of the aortic arch. Visualized proximal great vessels are normally patent. Pulmonary arteries are also well opacified and demonstrate normal patency and normal caliber. The heart size is normal. No pericardial fluid identified. No significant calcified coronary artery plaque identified. Mediastinum/Nodes: No enlarged mediastinal, hilar, or axillary lymph nodes. Thyroid gland, trachea, and esophagus demonstrate no significant findings. Lungs/Pleura: Bibasilar atelectasis present. There is a trace amount pleural fluid bilaterally, right greater than left. There is no evidence of pulmonary edema, focal airspace consolidation, pneumothorax, nodule or pleural fluid. Musculoskeletal: No chest wall abnormality. No acute or significant osseous findings. Review of the MIP images confirms the above findings. CTA ABDOMEN AND PELVIS FINDINGS VASCULAR Aorta: Calcified plaque in the abdominal aorta without evidence of aneurysmal disease or dissection. Celiac: Normally patent.  Normal branch vessel patency. SMA:  Normally patent. Renals: Two separate left and two separate right renal arteries demonstrate normal patency. The lower pole artery on the right emanates off of the distal aorta nearly at the aortic bifurcation. IMA: Normally patent. Inflow: Normally patent bilateral iliac arteries and common femoral arteries. Review of the MIP images confirms the above findings. NON-VASCULAR Hepatobiliary: No focal liver abnormality is seen. No gallstones, gallbladder wall thickening, or biliary dilatation. Pancreas: Unremarkable. No pancreatic ductal dilatation or surrounding inflammatory changes. Spleen: Normal in size without focal abnormality. Adrenals/Urinary Tract: Adrenal glands are unremarkable. Kidneys are normal, without renal calculi, focal  lesion, or hydronephrosis. Bladder is unremarkable. Stomach/Bowel: Moderate free intraperitoneal air is present in the peritoneal cavity. The stomach is distended with air and there is some potential inflammation and thickening of the proximal duodenum. At the level of the mid and lower abdomen, there are multiple dilated small bowel loops reaching maximal caliber of approximately 4 cm. The dilated loops are inflamed in appearance and hyperemic. One transversely oriented mid small bowel loop appears thickened and there is a layer of free intraperitoneal air anterior to this bowel loop. Lymphatic: No enlarged lymph nodes identified. Reproductive: Status post hysterectomy. No adnexal masses. Other: Small amount of free fluid in the pelvis and interspersed between some lower dilated small bowel loops. No focal abscess. Musculoskeletal: No acute or significant osseous findings. Review of the MIP images confirms the above findings. IMPRESSION: 1. No evidence of aortic dissection. 2. Moderate free intraperitoneal air in the peritoneal cavity. At the level of the mid and lower abdomen, there are multiple dilated small bowel loops reaching maximal caliber of approximately 4 cm. The dilated  loops are inflamed in appearance and hyperemic. One of the mid small bowel loops appears decompressed and thickened and there is a layer of free intraperitoneal air anterior to this bowel loop. Findings are consistent with a partial small bowel obstruction/ileus and small bowel perforation may be the source of free air. 3. Possible inflammation and thickening of the proximal duodenum. Perforated duodenal ulcer also cannot be excluded as a potential source for free intraperitoneal air. 4. Trace amount of pleural fluid bilaterally, right greater than left. 5. Small amount of free fluid in the abdomen and pelvis. These results were called by telephone at the time of interpretation on 03/30/2020 at 1:25 PM to provider [facility] , who verbally acknowledged these results. Electronically Signed   By: Aletta Edouard M.D.   On: 03/30/2020 13:34    Procedures .Critical Care Performed by: Deliah Boston, PA-C Authorized by: Deliah Boston, PA-C   Critical care provider statement:    Critical care time (minutes):  40   Critical care was time spent personally by me on the following activities:  Discussions with consultants, evaluation of patient's response to treatment, examination of patient, ordering and performing treatments and interventions, ordering and review of laboratory studies, ordering and review of radiographic studies, pulse oximetry, re-evaluation of patient's condition, obtaining history from patient or surrogate, review of old charts and development of treatment plan with patient or surrogate   (including critical care time)  Medications Ordered in ED Medications  sodium chloride (PF) 0.9 % injection (has no administration in time range)  piperacillin-tazobactam (ZOSYN) IVPB 3.375 g (3.375 g Intravenous New Bag/Given 03/30/20 1451)  clindamycin (CLEOCIN) IVPB 900 mg (has no administration in time range)    And  gentamicin (GARAMYCIN) 270 mg in dextrose 5 % 100 mL IVPB (has no  administration in time range)  bupivacaine liposome (EXPAREL) 1.3 % injection 266 mg (has no administration in time range)  clindamycin (CLEOCIN) 900 mg, gentamicin (GARAMYCIN) 240 mg in sodium chloride 0.9 % 1,000 mL for intraperitoneal lavage (has no administration in time range)  mupirocin ointment (BACTROBAN) 2 % 1 application (has no administration in time range)  morphine 4 MG/ML injection 4 mg (4 mg Intravenous Given 03/30/20 1127)  ondansetron (ZOFRAN) injection 4 mg (4 mg Intravenous Given 03/30/20 1127)  sodium chloride 0.9 % bolus 500 mL (0 mLs Intravenous Stopped 03/30/20 1451)  sodium chloride 0.9 % bolus 500 mL (0 mLs Intravenous  Stopped 03/30/20 1451)  iohexol (OMNIPAQUE) 350 MG/ML injection 100 mL (100 mLs Intravenous Contrast Given 03/30/20 1241)  fentaNYL (SUBLIMAZE) injection 50 mcg (50 mcg Intravenous Given by Other 03/30/20 1429)  ondansetron (ZOFRAN) injection 4 mg (4 mg Intravenous Given by Other 03/30/20 1429)  Chlorhexidine Gluconate Cloth 2 % PADS 6 each (6 each Topical Given 03/30/20 1444)    ED Course  I have reviewed the triage vital signs and the nursing notes.  Pertinent labs & imaging results that were available during my care of the patient were reviewed by me and considered in my medical decision making (see chart for details).    MDM Rules/Calculators/A&P                         Additional History Obtained: 1. Nursing notes from this visit.  No mention of chest pain in triage visit. 2. Patient had admission to the hospital on 03/01/2020, discharged on May 16.  Reviewed discharge summary, patient admitted 2 months prior for abdominal pain and small bowel obstruction.  CT scan showed inflammatory changes of the 1st part of duodenum and left mid abdomen.  General surgery recommended bowel rest and IV fluids and empiric antibiotics.  Treated with Zosyn.  Tolerating p.o. and was discharged. ------ 59 year old female arrived tired, well-appearing in no acute distress.   Patient does not appear septic.  Her chief complaint is right-sided chest pain which is radiating to her back onset this morning.  Vital signs are monitored within normal limits, no tachycardia, hypotension or hypoxia.  Cardiopulmonary exam within normal limits.  Abdomen is generally tender without peritoneal signs.  Abdominal pain work-up including CBC, CMP, lipase and urinalysis ordered in triage.  I have added EKG, troponin to work-up.  Additionally ordered chest and abdominal x-ray.  Patient received 400 mL normal saline by EMS, additional 500 mL ordered.  4 mg morphine ordered for pain control.  Patient reported that she becomes nauseous whenever she has pain medication 4 mg Zofran ordered. - EKG: Sinus rhythm Abnormal R-wave progression, early transition Borderline T wave abnormalities Confirmed by Pattricia Boss [phone]) on 03/30/2020 10:49:28 AM  Discussed case with Dr. Jeanell Sparrow. Will obtain CT Chest/Abd/Pelvis dissection study. - CBC shows leukocytosis of 12.8 with left shift, no evidence of anemia. High-sensitivity troponin negative. Pregnancy test negative. CMP shows no emergent Electra derangement, evidence of acute kidney injury, elevation of LFTs or gap. Lipase elevated at 113, will await CT scan to see if there is inflammation around the pancreas.  DG chest/abd:    IMPRESSION:  1. Several dilated air-filled central small bowel loops with mild  air throughout the colon. Findings may be seen and ileus versus  early/partial small bowel obstruction.    2. Hypoinflation without acute cardiopulmonary disease.  I have personally reviewed patient's x-ray and agree with radiologist interpretation.  Patient CT scan is pending will await CT results prior to discussion with general surgery. - Patient reassessed she is resting comfortably in bed no acute distress.  She is requesting additional pain medication, fentanyl ordered.  Given mild leukocytosis and new low-grade tachycardia lactic and blood  cultures were added.  On reassessment patient does not appear septic.  Tachycardia may be secondary to patient's pain. - 1:18 PM: Received call from radiologist Dr. Kathlene Cote, advises patient with what appears to be a partial SBO and free air, perforation.  Possibly around the duodenum.  Zosyn ordered, additional fluids ordered, consult placed to general surgery. - 1:24 PM:  Discussed case with general surgeon Dr. Johney Maine who is coming to see patient.  Advises hospitalist admission. - Patient reassessed, she is resting comfortably no acute distress, she states understanding of care plan she will discuss further options with general surgery as she is hesitant at this time given her history of ALS.  Patient is agreeable for admission. - CT Dissection study:  IMPRESSION:  1. No evidence of aortic dissection.  2. Moderate free intraperitoneal air in the peritoneal cavity. At  the level of the mid and lower abdomen, there are multiple dilated  small bowel loops reaching maximal caliber of approximately 4 cm.  The dilated loops are inflamed in appearance and hyperemic. One of  the mid small bowel loops appears decompressed and thickened and  there is a layer of free intraperitoneal air anterior to this bowel  loop. Findings are consistent with a partial small bowel  obstruction/ileus and small bowel perforation may be the source of  free air.  3. Possible inflammation and thickening of the proximal duodenum.  Perforated duodenal ulcer also cannot be excluded as a potential  source for free intraperitoneal air.  4. Trace amount of pleural fluid bilaterally, right greater than  left.  5. Small amount of free fluid in the abdomen and pelvis.  ------------- Discussed case with Dr. Sherral Hammers, patient has been accepted to hospitalist service.   Note: Portions of this report may have been transcribed using voice recognition software. Every effort was made to ensure accuracy; however, inadvertent  computerized transcription errors may still be present. Final Clinical Impression(s) / ED Diagnoses Final diagnoses:  Pneumoperitoneum    Rx / DC Orders ED Discharge Orders    None       Gari Crown 03/30/20 1512    Pattricia Boss, MD 03/30/20 1611

## 2020-03-30 NOTE — Anesthesia Preprocedure Evaluation (Addendum)
Anesthesia Evaluation  Patient identified by MRN, date of birth, ID band Patient awake    Reviewed: Allergy & Precautions, NPO status , Patient's Chart, lab work & pertinent test results  Airway Mallampati: I  TM Distance: >3 FB Neck ROM: Limited    Dental no notable dental hx. (+) Teeth Intact, Dental Advisory Given   Pulmonary neg pulmonary ROS,    Pulmonary exam normal breath sounds clear to auscultation       Cardiovascular negative cardio ROS Normal cardiovascular exam Rhythm:Regular Rate:Normal     Neuro/Psych Current ALS Clinical status: Respiratory: no issues Swallowing: increased difficulty swallowing over the last two weeks, can still swallow pills and eat without difficulty Speech: No issues Upper Extremities: No movement except fingers bilaterally Lower Extremities: Ambulatory with assistance  ALS: from 02/2020 Neurology visit:: Speech: No issues. Swallowing: No issues, no concern with aspirations. Saliva: No issues.  Breathing: No issues. She does think her cough is weaker.  Arm symptoms: She feels like her arms and neck are significantly weaker. Leg symptoms: Legs feel less strong and she is unsteady on her feet. She uses a stair lift almost exclusively but still ambulatory  negative psych ROS   GI/Hepatic negative GI ROS, Neg liver ROS, Small bowel obstruction: free air   Endo/Other  negative endocrine ROS  Renal/GU negative Renal ROS  negative genitourinary   Musculoskeletal negative musculoskeletal ROS (+)   Abdominal   Peds  Hematology negative hematology ROS (+)   Anesthesia Other Findings Presented initially in Feb with SBO, managed conservatively. Now presents with abdominal free air.  Reproductive/Obstetrics                          Anesthesia Physical Anesthesia Plan  ASA: III  Anesthesia Plan: General   Post-op Pain Management:    Induction: Intravenous  and Rapid sequence  PONV Risk Score and Plan: 3 and Ondansetron, Dexamethasone and Treatment may vary due to age or medical condition  Airway Management Planned: Oral ETT and Video Laryngoscope Planned  Additional Equipment:   Intra-op Plan:   Post-operative Plan: Possible Post-op intubation/ventilation and Extubation in OR  Informed Consent: I have reviewed the patients History and Physical, chart, labs and discussed the procedure including the risks, benefits and alternatives for the proposed anesthesia with the patient or authorized representative who has indicated his/her understanding and acceptance.     Dental advisory given  Plan Discussed with: CRNA  Anesthesia Plan Comments:      Anesthesia Quick Evaluation

## 2020-03-30 NOTE — ED Notes (Signed)
Gross MD notified the RN that he needed the Covid results back stat due to emergency surgery. Another RN called lab to see if we would be able to get results quicker. Lab informed RN they would post them as soon as they were able to.

## 2020-03-30 NOTE — Progress Notes (Signed)
A consult was received from an ED physician for Zosyn per pharmacy dosing.  The patient's profile has been reviewed for ht/wt/allergies/indication/available labs.   A one time order has been placed for Zosyn 3.375gm IV.  Further antibiotics/pharmacy consults should be ordered by admitting physician if indicated.                       Thank you, Netta Cedars 03/30/2020  1:24 PM

## 2020-03-30 NOTE — Consult Note (Addendum)
Lori Alvarez  05-06-1961 [id number]  CARE TEAM:  PCP: Shirline Frees, MD  Outpatient Care Team: Patient Care Team: Shirline Frees, MD as PCP - General (Family Medicine) Kristeen Miss, MD as Consulting Physician (Neurosurgery)  Inpatient Treatment Team: Treatment Team: Attending Provider: Pattricia Boss, MD; Physician Assistant: Gari Crown; Registered Nurse: Joycelyn Schmid, RN; Consulting Physician: Edison Pace, Md, MD   This patient is a 59 y.o.female who presents today for surgical evaluation at the request of Lori Alvarez, [place].   Chief complaint / Reason for evaluation: Severe abdominal pain and free air.  59 year old woman with upper body weakness and diagnosed with ALS about 2 years ago.  Still ambulatory.  Had episode of nausea vomiting abdominal pain.  Admitted in February of concerns of possible bowel obstruction.  Focal segment of bowel inflammation more suspicious for enteritis.  Surgery consulted.  Resolved nonoperatively.  Readmitted in May with similar symptoms.  Concerning for obstruction but resolved nonoperatively.  There was discussion of considering surgery but given her neurological diagnosis, that was held off.  CT enterography showed no mass or tumor but probable adhesions from her primary GYN surgeries including hysterectomy.  She normally moves her bowels about every other day.  She has been evaluated by gastroenterology in the past.  [facility].  Had underwhelming colonoscopy about 2 years ago.  She awoke work this morning with severe sharp chest pain and upper abdominal pain.  Worsened.  Came to the emergency room.  Cardiac and pulmonary etiologies ruled out.  CT abdomen done showing free air.  Surgical consultation requested.  Husband at bedside.  Of note she had had discussions with palliative care in the past.  Was wondering if hospice is an option but it was felt she was too functional for that as of her last conversation last night.  She is not  on blood thinners.  She does take Mobic and Lyrica for chronic pain as well as Cymbalta.  No history of ulcerations that she can recall.  No severe heartburn or reflux.  She cannot use her upper extremities very well but she is able to transfer and walk at the home.  She is hesitant to consider surgery but she is also hesitant to go to hospice as well.   Assessment  [name]  59 y.o. female       Problem List:  Active Problems:   * No active hospital problems. *   Pneumoperitoneum of uncertain etiology.  Possible duodenal ulcer perforation versus small bowel perforation from chronic small bowel inflammation question obstruction intermittent with 2 prior admissions this year.  ALS with neurological decline   Plan:  There are no easy options here.  Had a frank discussion with the patient and her husband.  The most aggressive option is to do emergent operative surgery in the hopes of finding isolating and treating the condition.  I feel optimistic about the actual procedure itself.  The biggest challenges with her ALS she already has fair neurological function.  Most likely she will require ventilation.  There is a significant chance that she may not come off the ventilator or may not rally.  However, without surgery I think her risk of mortality is imminent.  Another option is to consider comfort care which most likely would need to be provided in the hospital for IV access since her digestive tract is shut down from the perforation.  She is not quite ready to consider that yet.  I had some repeated discussions and gave him time to talk between themselves.  She is wishing to give surgery a try in the hopes of turning this around.  Her husband supports her.  We will work to be aggressive and have specialties involved.  It may be that she will not rally after the surgery and may transition to comfort care postoperatively.  It may be she gets through this and returns to her quality of life.   Difficult to say.  The anatomy & physiology of the digestive tract was discussed.  The pathophysiology of perforation was discussed.  Differential diagnosis such as perforated ulcer or colon, etc was discussed.   Natural history risks without surgery such as death was discussed.  I recommended abdominal exploration to diagnose & treat the source of the problem.  Laparoscopic & open techniques were discussed.   Risks such as bleeding, infection, abscess, leak, reoperation, bowel resection, possible ostomy, injury to other organs, need for repair of tissues / organs, hernia, heart attack, death, and other risks were discussed.   The risks of no intervention will lead to serious problems including death.   I expressed a good likelihood that surgery will address the problem.    Goals of post-operative recovery were discussed as well.  We will work to minimize complications although risks in an emergent setting are high.   Questions were answered.  The patient expressed understanding & wishes to proceed with surgery.       VTE prophylaxis- SCDs, etc -mobilize as tolerated to help recovery  60 minutes spent in review, evaluation, examination, counseling, and coordination of care.  More than 50% of that time was spent in counseling.  Adin Hector, MD, FACS, MASCRS Gastrointestinal and Minimally Invasive Surgery  [facility] [address] [place], [place] [address] [phone] Fax [phone] Main/Paging  CONTACT INFORMATION: Weekday (9AM-5PM) concerns: Call [facility] main office at [phone] Weeknight (5PM-9AM) or Weekend/Holiday concerns: Check [email or web address] for General Surgery [facility] coverage (Please, do not use SecureChat as it is not reliable communication to operating surgeons for immediate patient care)      03/30/2020      Past Medical History:  Diagnosis Date  . ALS (amyotrophic lateral sclerosis) ([place])   . Postherpetic neuralgia     Past  Surgical History:  Procedure Laterality Date  . APPENDECTOMY    . PARTIAL HYSTERECTOMY      Social History   Socioeconomic History  . Marital status: Married    Spouse name: Not on file  . Number of children: 0  . Years of education: 35  . Highest education level: Not on file  Occupational History  . Occupation: [employer]  Tobacco Use  . Smoking status: Never Smoker  . Smokeless tobacco: Never Used  Vaping Use  . Vaping Use: Never used  Substance and Sexual Activity  . Alcohol use: No  . Drug use: No  . Sexual activity: Not on file  Other Topics Concern  . Not on file  Social History Narrative   Lives with husband   Right handed    Caffeine use: Coke- 12 oz or less    Social Determinants of Radio broadcast assistant Strain:   . Difficulty of Paying Living Expenses:   Food Insecurity:   . Worried About Charity fundraiser in the Last Year:   . Arboriculturist in the Last Year:   Transportation Needs:   . Lack  of Transportation (Medical):   Marland Kitchen Lack of Transportation (Non-Medical):   Physical Activity:   . Days of Exercise per Week:   . Minutes of Exercise per Session:   Stress:   . Feeling of Stress :   Social Connections:   . Frequency of Communication with Friends and Family:   . Frequency of Social Gatherings with Friends and Family:   . Attends Religious Services:   . Active Member of Clubs or Organizations:   . Attends Archivist Meetings:   Marland Kitchen Marital Status:   Intimate Partner Violence:   . Fear of Current or Ex-Partner:   . Emotionally Abused:   Marland Kitchen Physically Abused:   . Sexually Abused:     Family History  Problem Relation Age of Onset  . Myopathy Mother   . Cancer Father        renal    Current Facility-Administered Medications  Medication Dose Route Frequency Provider Last Rate Last Admin  . fentaNYL (SUBLIMAZE) injection 50 mcg  50 mcg Intravenous Once Morelli, Brandon A, PA-C      . ondansetron [facility]) injection  4 mg  4 mg Intravenous Once Morelli, Brandon A, PA-C      . piperacillin-tazobactam (ZOSYN) IVPB 3.375 g  3.375 g Intravenous Once Thomes Lolling, RPH      . sodium chloride (PF) 0.9 % injection            Current Outpatient Medications  Medication Sig Dispense Refill  . acetaminophen (TYLENOL) 500 MG tablet Take 500 mg by mouth in the morning, at noon, in the evening, and at bedtime.    . ALPRAZolam (XANAX) 0.5 MG tablet Take 0.25-0.5 mg by mouth 2 (two) times daily. Take 1/2 tablet (0.25 mg) in the morning and Take 1 tablet (0.5 mg) at bedtime    . baclofen (LIORESAL) 10 MG tablet Take 10 mg by mouth 4 (four) times daily.     . Black Cohosh (REMIFEMIN) 20 MG TABS Take 20 mg by mouth every evening.     . Cyanocobalamin (VITAMIN B-12 PO) Take 500 mcg by mouth daily.    . DULoxetine (CYMBALTA) 20 MG capsule Take 20 mg by mouth every evening.     . hyoscyamine (LEVBID) 0.375 MG 12 hr tablet Take 0.375 mg by mouth every 12 (twelve) hours.    . melatonin 5 MG TABS Take 5 mg by mouth at bedtime.    . meloxicam (MOBIC) 15 MG tablet Take 15 mg by mouth daily.    . polycarbophil (FIBERCON) 625 MG tablet Take 625 mg by mouth daily.    . pregabalin (LYRICA) 50 MG capsule Take 50-100 mg by mouth See admin instructions. Take 100 mg (2 capsules) at 10 am, take 50 mg (1 capsules) at 2 pm, take 100 mg (2 capsules) at 6 pm, and take 50 mg (1 capsule) at 10 pm.       Allergies  Allergen Reactions  . Allegra [Fexofenadine]     Ringing in ears   . Clotrim-Undecyl-Tea Tr-Lav Oil     unknown  . Doxepin     Headaches  . Keflex [Cephalexin]     "too strong, made me drowsy and dizzy"  . Nitroglycerin     Migraine  . Nitrofuran Derivatives Rash  . Sulfa Antibiotics Rash    ROS:   All other systems reviewed & are negative except per HPI or as noted below: Constitutional:  No fevers, chills, sweats.  Weight stable Eyes:  No vision  changes, No discharge HENT:  No sore throats, nasal drainage Lymph:  No neck swelling, No bruising easily Pulmonary:  No cough, productive sputum CV: No orthopnea, PND  Patient walks and transfers at the home but has luminal on lower extremity use.  GI:  No personal nor family history of GI/colon cancer, inflammatory bowel disease, irritable bowel syndrome, allergy such as Celiac Sprue, dietary/dairy problems, colitis, ulcers nor gastritis.  No recent sick contacts/gRecent hospitalizations with possible enteritis versus obstructions noted in the chart.  No travel outside the country.  No changes in diet. Renal: No UTIs, No hematuria Genital:  No drainage, bleeding, masses Musculoskeletal: Chronic joint pain unchanged.  Upper extremity weakness rather severe unchanged.   Skin:  No sores or lesions.  No rashes Heme/Lymph:  No easy bleeding.  No swollen lymph nodes Neuro: Significant upper extremity weakness but lower extremity mostly intact..  No seizures Psych: No suicidal ideation.  No hallucinations  BP (!) 123/54   Pulse (!) 102   Temp (!) 97.5 F (36.4 C) (Oral)   Resp (!) 21   Ht 5\' 3"  (1.6 m)   Wt 53.5 kg   SpO2 97%   BMI 20.90 kg/m   Physical Exam: Constitutional: Somewhat masked face but alert.  Uncomfortable not cachectic.  Hygeine adequate.  Vitals signs as above.   Eyes: Pupils reactive, normal extraocular movements. Sclera nonicteric Neuro: CN II-XII intact.  Very weak bilateral upper extremities.   Lymph: No head/neck/groin lymphadenopathy Psych:  No severe agitation.  No severe anxiety.  Judgment & insight Adequate, Oriented x4, HENT: Normocephalic, Mucus membranes moist.  No thrush.   Neck: Supple, No tracheal deviation.  No obvious thyromegaly Chest: No pain to chest wall compression.  Good respiratory excursion.  No audible wheezing CV:  Pulses intact.  Sinus tachycardia regular rhythm.  No major extremity edema Abdomen:  Somewhat firm.  Very distended.  Moderate tenderness to palpation especially in upper abdomen with some peritoneal  signs.. No incarcerated hernias.  No hepatomegaly.  No splenomegaly Gen:  No inguinal hernias.  No inguinal lymphadenopathy.   Ext: Some extensive contractures of both hands.  No cyanosis Skin: No major subcutaneous nodules.  Warm and dry Musculoskeletal: Severe joint rigidity Noted at Wrists and fingers bilaterally.  No obvious clubbing.  No digital petechiae.     Results:   Labs: Results for orders placed or performed during the hospital encounter of 03/30/20 (from the past 48 hour(s))  CBC with Differential     Status: Abnormal   Collection Time: 03/30/20 11:09 AM  Result Value Ref Range   WBC 12.8 (H) 4.0 - 10.5 K/uL   RBC 4.26 3.87 - 5.11 MIL/uL   Hemoglobin 12.2 12.0 - 15.0 g/dL   HCT 39.4 36 - 46 %   MCV 92.5 80.0 - 100.0 fL   MCH 28.6 26.0 - 34.0 pg   MCHC 31.0 30.0 - 36.0 g/dL   RDW 16.3 (H) 11.5 - 15.5 %   Platelets 436 (H) 150 - 400 K/uL   nRBC 0.0 0.0 - 0.2 %   Neutrophils Relative % 88 %   Neutro Abs 11.3 (H) 1.7 - 7.7 K/uL   Lymphocytes Relative 9 %   Lymphs Abs 1.2 0.7 - 4.0 K/uL   Monocytes Relative 3 %   Monocytes Absolute 0.4 0 - 1 K/uL   Eosinophils Relative 0 %   Eosinophils Absolute 0.0 0 - 0 K/uL   Basophils Relative 0 %   Basophils Absolute 0.0 0 -  0 K/uL   Immature Granulocytes 0 %   Abs Immature Granulocytes 0.05 0.00 - 0.07 K/uL    Comment: Performed at [facility], [place] [address], [place], [place] [address]  Comprehensive metabolic panel     Status: Abnormal   Collection Time: 03/30/20 11:09 AM  Result Value Ref Range   Sodium 141 135 - 145 mmol/L   Potassium 4.4 3.5 - 5.1 mmol/L   Chloride 106 98 - 111 mmol/L   CO2 25 22 - 32 mmol/L   Glucose, Bld 124 (H) 70 - 99 mg/dL    Comment: Glucose reference range applies only to samples taken after fasting for at least 8 hours.   BUN 13 6 - 20 mg/dL   Creatinine, Ser 0.45 0.44 - 1.00 mg/dL   Calcium 8.8 (L) 8.9 - 10.3 mg/dL   Total Protein 6.4 (L) 6.5 - 8.1 g/dL   Albumin 3.5  3.5 - 5.0 g/dL   AST 17 15 - 41 U/L   ALT 12 0 - 44 U/L   Alkaline Phosphatase 75 38 - 126 U/L   Total Bilirubin 0.6 0.3 - 1.2 mg/dL   GFR calc non Af Amer >60 >60 mL/min   GFR calc Af Amer >60 >60 mL/min   Anion gap 10 5 - 15    Comment: Performed at [facility], [place] [address] [place], [place] [address]  Lipase, blood     Status: Abnormal   Collection Time: 03/30/20 11:09 AM  Result Value Ref Range   Lipase 113 (H) 11 - 51 U/L    Comment: Performed at [facility], [place] [address] [place], [place] [address]  Troponin I (High Sensitivity)     Status: None   Collection Time: 03/30/20 11:09 AM  Result Value Ref Range   Troponin I (High Sensitivity) <2 <18 ng/L    Comment: (NOTE) Elevated high sensitivity troponin I (hsTnI) values and significant  changes across serial measurements may suggest ACS but many other  chronic and acute conditions are known to elevate hsTnI results.  Refer to the "Links" section for chest pain algorithms and additional  guidance. Performed at [facility], [place] [address], [place], [place] [address]   I-Stat Beta hCG blood, ED ([facility], [facility], [facility] only)     Status: None   Collection Time: 03/30/20 11:16 AM  Result Value Ref Range   I-stat hCG, quantitative <5.0 <5 mIU/mL   Comment 3            Comment:   GEST. AGE      CONC.  (mIU/mL)   <=1 WEEK        5 - 50     2 WEEKS       50 - 500     3 WEEKS       100 - 10,000     4 WEEKS     1,000 - 30,000        FEMALE AND NON-PREGNANT FEMALE:     LESS THAN 5 mIU/mL     Imaging / Studies: CT ABDOMEN PELVIS W CONTRAST  Result Date: 03/01/2020 CLINICAL DATA:  Abdominal distension. Concern for small bowel obstruction. EXAM: CT ABDOMEN AND PELVIS WITH CONTRAST TECHNIQUE: Multidetector CT imaging of the abdomen and pelvis was performed using the standard protocol following bolus administration of intravenous contrast. CONTRAST:  129mL OMNIPAQUE IOHEXOL 300 MG/ML   SOLN COMPARISON:  Radiograph yesterday. CT enterography 11/30/2019 FINDINGS: Lower chest: Chronic atelectasis  or scarring in the right middle and lower lobes. Mild hypoventilatory change in the dependent lungs. Normal heart size with coronary artery calcifications. Hepatobiliary: Decreased hepatic density consistent with steatosis. More focal fatty infiltration adjacent to the falciform ligament. No suspicious hepatic lesion. Distended gallbladder without pericholecystic inflammation or calcified gallstone. No biliary dilatation. Pancreas: No ductal dilatation or inflammation. Spleen: Normal in size without focal abnormality. Adrenals/Urinary Tract: Mild left adrenal thickening without dominant nodule. Normal right adrenal gland. No hydronephrosis or perinephric edema. Homogeneous renal enhancement with symmetric excretion on delayed phase imaging. Parapelvic cyst in the lower right kidney. Urinary bladder is physiologically distended without wall thickening. Stomach/Bowel: Physiologically distended stomach without abnormal distension or gastric wall thickening. Small duodenal diverticulum. Short to moderate length segment of small bowel in the left mid abdomen with wall thickening, enhancement, and perienteric edema. There is associated fecalization of small bowel contents at the distal aspect but no discrete transition point. This inflamed small bowel is mildly dilated measuring up to 3.5 cm. Small bowel is otherwise normal in caliber with scattered air in fluid but no evidence of obstruction. No terminal ileal inflammation. Post appendectomy. Air in stool in the colon without inflammation or wall thickening. Minor diverticulosis of the descending and sigmoid colon without diverticulitis. Vascular/Lymphatic: Moderate aortic atherosclerosis. No aortic aneurysm. Patent portal vein. Patent mesenteric vessels. Scattered central mesenteric nodes, largest measuring 6 mm, likely reactive. No enlarged lymph nodes in the  abdomen or pelvis. Reproductive: Post hysterectomy. Ovaries are quiescent. No adnexal mass. Other: Minimal mesenteric edema in the left abdomen in the region of small bowel inflammation. No significant ascites. No free air. No intra-abdominal abscess. Tiny fat containing umbilical hernia. Musculoskeletal: There are no acute or suspicious osseous abnormalities. IMPRESSION: 1. Short to moderate length segment of small bowel in the left mid abdomen with wall thickening, enhancement, and perienteric edema, consistent with enteritis, likely infectious or inflammatory. There is fecalization of small bowel contents mild dilatation involving the inflamed small bowel however no other small bowel dilatation or evidence of obstruction. Overall improved small bowel dilatation since radiograph yesterday. 2. Mild hepatic steatosis. 3. Minimal colonic diverticulosis without diverticulitis. Aortic Atherosclerosis (ICD10-I70.0). Electronically Signed   By: Keith Rake M.D.   On: 03/01/2020 20:03   DG Abd 2 Views  Result Date: 03/03/2020 CLINICAL DATA:  Small-bowel obstruction. EXAM: ABDOMEN - 2 VIEW COMPARISON:  Plain film of the abdomen dated 02/29/2020. FINDINGS: Today's bowel gas pattern is nonobstructive. No dilated small bowel loops are identified. Air and stool is seen within the distal colon. No evidence of abnormal fluid collection or free intraperitoneal air. Lung bases appear clear. Osseous structures are unremarkable. IMPRESSION: Nonobstructive bowel gas pattern. Electronically Signed   By: Franki Cabot M.D.   On: 03/03/2020 08:51   DG Abd 2 Views  Result Date: 03/01/2020 CLINICAL DATA:  Abdominal cramping, history of small-bowel obstruction, ALS EXAM: ABDOMEN - 2 VIEW COMPARISON:  01/18/2020 FINDINGS: Scattered normal stool burden throughout colon. Small amount gas within stomach. Markedly dilated small bowel loop in the mid abdomen significantly increased from prior exam with paucity of distal colonic gas  consistent with small bowel obstruction. Air-fluid levels on upright view. No bowel wall thickening or free air. Osseous structures unremarkable. Minimal bibasilar atelectasis. No urinary tract calcification. IMPRESSION: Small-bowel obstruction at proximal to mid small bowel. These results will be called to the ordering clinician or representative by the Radiologist Assistant, and communication documented in the PACS or [employer]. Electronically Signed   By:  Lavonia Dana M.D.   On: 03/01/2020 10:40   DG Abdomen Acute W/Chest  Result Date: 03/30/2020 CLINICAL DATA:  Epigastric pain radiating to right side. History of ALS. EXAM: DG ABDOMEN ACUTE W/ 1V CHEST COMPARISON:  Chest x-ray 09/05/2019 and abdominal films 03/03/2020 FINDINGS: Patient is slightly rotated to the right. Lungs are hypoinflated but otherwise clear. Cardiomediastinal silhouette and remainder of the chest is unremarkable. Abdominopelvic images demonstrate several air-filled dilated central small bowel loops measuring up to 4.2 cm in diameter. Mild amount of air and stool throughout the colon. Mild gastric distension. No free peritoneal air. Remainder the exam is unchanged. IMPRESSION: 1. Several dilated air-filled central small bowel loops with mild air throughout the colon. Findings may be seen and ileus versus early/partial small bowel obstruction. 2.  Hypoinflation without acute cardiopulmonary disease. Electronically Signed   By: Marin Olp M.D.   On: 03/30/2020 12:52   CT Angio Chest/Abd/Pel for Dissection W and/or Wo Contrast  Result Date: 03/30/2020 CLINICAL DATA:  Epigastric pain radiating into right abdomen and back. History of ALS. EXAM: CT ANGIOGRAPHY CHEST, ABDOMEN AND PELVIS TECHNIQUE: Non-contrast CT of the chest was initially obtained. Multidetector CT imaging through the chest, abdomen and pelvis was performed using the standard protocol during bolus administration of intravenous contrast. Multiplanar reconstructed  images and MIPs were obtained and reviewed to evaluate the vascular anatomy. CONTRAST:  159mL OMNIPAQUE IOHEXOL 350 MG/ML SOLN COMPARISON:  CT of the abdomen and pelvis on 03/01/2020 FINDINGS: CTA CHEST FINDINGS Cardiovascular: The thoracic aorta is normal in caliber and demonstrates no evidence of dissection. There is calcified plaque at the level of the aortic arch. Visualized proximal great vessels are normally patent. Pulmonary arteries are also well opacified and demonstrate normal patency and normal caliber. The heart size is normal. No pericardial fluid identified. No significant calcified coronary artery plaque identified. Mediastinum/Nodes: No enlarged mediastinal, hilar, or axillary lymph nodes. Thyroid gland, trachea, and esophagus demonstrate no significant findings. Lungs/Pleura: Bibasilar atelectasis present. There is a trace amount pleural fluid bilaterally, right greater than left. There is no evidence of pulmonary edema, focal airspace consolidation, pneumothorax, nodule or pleural fluid. Musculoskeletal: No chest wall abnormality. No acute or significant osseous findings. Review of the MIP images confirms the above findings. CTA ABDOMEN AND PELVIS FINDINGS VASCULAR Aorta: Calcified plaque in the abdominal aorta without evidence of aneurysmal disease or dissection. Celiac: Normally patent.  Normal branch vessel patency. SMA: Normally patent. Renals: Two separate left and two separate right renal arteries demonstrate normal patency. The lower pole artery on the right emanates off of the distal aorta nearly at the aortic bifurcation. IMA: Normally patent. Inflow: Normally patent bilateral iliac arteries and common femoral arteries. Review of the MIP images confirms the above findings. NON-VASCULAR Hepatobiliary: No focal liver abnormality is seen. No gallstones, gallbladder wall thickening, or biliary dilatation. Pancreas: Unremarkable. No pancreatic ductal dilatation or surrounding inflammatory  changes. Spleen: Normal in size without focal abnormality. Adrenals/Urinary Tract: Adrenal glands are unremarkable. Kidneys are normal, without renal calculi, focal lesion, or hydronephrosis. Bladder is unremarkable. Stomach/Bowel: Moderate free intraperitoneal air is present in the peritoneal cavity. The stomach is distended with air and there is some potential inflammation and thickening of the proximal duodenum. At the level of the mid and lower abdomen, there are multiple dilated small bowel loops reaching maximal caliber of approximately 4 cm. The dilated loops are inflamed in appearance and hyperemic. One transversely oriented mid small bowel loop appears thickened and there is a layer  of free intraperitoneal air anterior to this bowel loop. Lymphatic: No enlarged lymph nodes identified. Reproductive: Status post hysterectomy. No adnexal masses. Other: Small amount of free fluid in the pelvis and interspersed between some lower dilated small bowel loops. No focal abscess. Musculoskeletal: No acute or significant osseous findings. Review of the MIP images confirms the above findings. IMPRESSION: 1. No evidence of aortic dissection. 2. Moderate free intraperitoneal air in the peritoneal cavity. At the level of the mid and lower abdomen, there are multiple dilated small bowel loops reaching maximal caliber of approximately 4 cm. The dilated loops are inflamed in appearance and hyperemic. One of the mid small bowel loops appears decompressed and thickened and there is a layer of free intraperitoneal air anterior to this bowel loop. Findings are consistent with a partial small bowel obstruction/ileus and small bowel perforation may be the source of free air. 3. Possible inflammation and thickening of the proximal duodenum. Perforated duodenal ulcer also cannot be excluded as a potential source for free intraperitoneal air. 4. Trace amount of pleural fluid bilaterally, right greater than left. 5. Small amount of  free fluid in the abdomen and pelvis. These results were called by telephone at the time of interpretation on 03/30/2020 at 1:25 PM to provider [facility] , who verbally acknowledged these results. Electronically Signed   By: Aletta Edouard M.D.   On: 03/30/2020 13:34    Medications / Allergies: per chart  Antibiotics: Anti-infectives (From admission, onward)   Start     Dose/Rate Route Frequency Ordered Stop   03/30/20 1330  piperacillin-tazobactam (ZOSYN) IVPB 3.375 g     Discontinue     3.375 g 12.5 mL/hr over 240 Minutes Intravenous  Once 03/30/20 1324          Note: Portions of this report may have been transcribed using voice recognition software. Every effort was made to ensure accuracy; however, inadvertent computerized transcription errors may be present.   Any transcriptional errors that result from this process are unintentional.    Adin Hector, MD, FACS, MASCRS Gastrointestinal and Minimally Invasive Surgery  [facility] [address], [place], [place] [address] [phone] Fax [phone] Main/Paging  CONTACT INFORMATION: Weekday (9AM-5PM) concerns: Call [facility] main office at [phone] Weeknight (5PM-9AM) or Weekend/Holiday concerns: Check [email or web address] for General Surgery [facility] coverage (Please, do not use SecureChat as it is not reliable communication to operating surgeons for immediate patient care)      03/30/2020  1:44 PM

## 2020-03-30 NOTE — ED Triage Notes (Signed)
Per Ems, patient comes from home with c/o epigastric pain radiating toward right side. Pain 10/10. Patient has history of GI issues (has had CT scans in past to rule out blockage) - history of ALS

## 2020-03-30 NOTE — H&P (Signed)
[facility] History and Physical  SOUA LENK ZOX:[id number] DOB: 1961/03/10 DOA: 03/30/2020  Referring physician:  PCP: Shirline Frees, MD   Chief Complaint:   HPI: Lori Alvarez is a 59 y.o. WF PMHx  ALS, MDS, SBO, Appendectomy, collagenous colitis, partial Hysterectomy, postherpetic neuralgia.  Generalized muscle weakness  Patient presents today with right-sided chest pain onset around 8 AM this morning after waking up described as a sharp pain occasionally radiating to the center of her chest and her left side of her back, pain is moderate-severe constant no alleviating or aggravating factors.  Additionally patient reports abdominal pain which has been ongoing since May 14, she reports at that time she was diagnosed with a partial small bowel obstruction.  She reports lingering abdominal pain since that time, generalized aching sharp constant no clear aggravating or alleviating factors.  Denies fevers/chills, cough/hemoptysis, vomiting, diarrhea, dysuria/hematuria, fall/injury or any additional concerns.     Review of Systems:  Covid vaccination; not vaccinated  Constitutional:  No weight loss, night sweats, Fevers, chills, fatigue.  HEENT:  No headaches, Difficulty swallowing,Tooth/dental problems,Sore throat,  No sneezing, itching, ear ache, nasal congestion, post nasal drip,  Cardio-vascular:  No chest pain, Orthopnea, PND, swelling in lower extremities, anasarca, dizziness, palpitations  GI:  No heartburn, indigestion, positive abdominal pain, nausea, vomiting, negative diarrhea, change in bowel habits, loss of appetite  Resp:  No shortness of breath with exertion or at rest. No excess mucus, no productive cough, No non-productive cough, No coughing up of blood.No change in color of mucus.No wheezing.No chest wall deformity  Skin:  no rash or lesions.  GU:  no dysuria, change in color of urine, no urgency or frequency. No flank pain.   Musculoskeletal:  positive joint pain or swelling. positive decreased range of motion. No back pain.  Psych:  No change in mood or affect. positive depression or anxiety. No memory loss.   Past Medical History:  Diagnosis Date  . ALS (amyotrophic lateral sclerosis) ([place])   . Postherpetic neuralgia   . SBO (small bowel obstruction) ([place]) 12/06/2019   Past Surgical History:  Procedure Laterality Date  . APPENDECTOMY    . PARTIAL HYSTERECTOMY     Social History:  reports that she has never smoked. She has never used smokeless tobacco. She reports that she does not drink alcohol and does not use drugs.  Allergies  Allergen Reactions  . Allegra [Fexofenadine]     Ringing in ears   . Clotrim-Undecyl-Tea Tr-Lav Oil     unknown  . Doxepin     Headaches  . Keflex [Cephalexin]     "too strong, made me drowsy and dizzy"  . Nitroglycerin     Migraine  . Nitrofuran Derivatives Rash  . Sulfa Antibiotics Rash    Family History  Problem Relation Age of Onset  . Myopathy Mother   . Cancer Father        renal    Prior to Admission medications   Medication Sig Start Date End Date Taking? Authorizing Provider  acetaminophen (TYLENOL) 500 MG tablet Take 500 mg by mouth in the morning, at noon, in the evening, and at bedtime.   Yes [provider]  ALPRAZolam (XANAX) 0.5 MG tablet Take 0.25-0.5 mg by mouth 2 (two) times daily. Take 1/2 tablet (0.25 mg) in the morning and Take 1 tablet (0.5 mg) at bedtime   Yes [provider]  baclofen (LIORESAL) 10 MG tablet Take 10 mg by mouth 4 (four) times daily.  09/16/19  Yes [provider]  Black Cohosh (REMIFEMIN) 20 MG TABS Take 20 mg by mouth every evening.    Yes [provider]  Cyanocobalamin (VITAMIN B-12 PO) Take 500 mcg by mouth daily.   Yes [provider]  DULoxetine (CYMBALTA) 20 MG capsule Take 20 mg by mouth every evening.  12/02/19  Yes [provider]  hyoscyamine (LEVBID) 0.375  MG 12 hr tablet Take 0.375 mg by mouth every 12 (twelve) hours.   Yes [provider]  melatonin 5 MG TABS Take 5 mg by mouth at bedtime.   Yes [provider]  meloxicam (MOBIC) 15 MG tablet Take 15 mg by mouth daily.   Yes [provider]  polycarbophil (FIBERCON) 625 MG tablet Take 625 mg by mouth daily.   Yes [provider]  pregabalin (LYRICA) 50 MG capsule Take 50-100 mg by mouth See admin instructions. Take 100 mg (2 capsules) at 10 am, take 50 mg (1 capsules) at 2 pm, take 100 mg (2 capsules) at 6 pm, and take 50 mg (1 capsule) at 10 pm. 12/02/19  Yes [provider]     Consultants:  [facility] Dr. Remo Lipps gross   Procedures/Significant Events:  6/12 DG abdomen acute view;-several dilated air-filled central small bowel loops with mild air throughout the colon. Findings may be seen and ileus versus early/partial small bowel obstruction. -Hypoinflation without acute cardiopulmonary disease 6/12 CTA chest and abdomen;No evidence of aortic dissection. -Moderate free intraperitoneal air in the peritoneal cavity. At the level of the mid and lower abdomen, there are multiple dilated small bowel loops reaching maximal caliber of approximately 4 cm. The dilated loops are inflamed in appearance and hyperemic. One of the mid small bowel loops appears decompressed and thickened and there is a layer of free intraperitoneal air anterior to this bowel loop. Findings are consistent with a partial small bowel obstruction/ileus and small bowel perforation may be the source of free air. -Possible inflammation and thickening of the proximal duodenum.Perforated duodenal ulcer also cannot be excluded as a potential source for free intraperitoneal air. -Trace amount of pleural fluid bilaterally, right>>left. -Small amount of free fluid in the abdomen and pelvis.    I have personally reviewed and interpreted all radiology studies and my findings are as  above.   VENTILATOR SETTINGS:    Cultures 5/14 SARS coronavirus negative    Antimicrobials: Anti-infectives (From admission, onward)   Start     Ordered Stop   03/30/20 1445  clindamycin (CLEOCIN) IVPB 900 mg     Discontinue    "And" Linked Group Details   03/30/20 1427 03/31/20 0559   03/30/20 1445  gentamicin (GARAMYCIN) 270 mg in dextrose 5 % 100 mL IVPB     Discontinue    "And" Linked Group Details   03/30/20 1427 03/31/20 0559   03/30/20 1430  clindamycin (CLEOCIN) 900 mg, gentamicin (GARAMYCIN) 240 mg in sodium chloride 0.9 % 1,000 mL for intraperitoneal lavage     Discontinue    Note to Pharmacy: Have in the  [place] room for final irrigation in bowel surgery case to minimize risk of abscess/infection Pharmacy may adjust dosing strength, schedule, rate of infusion, etc as needed to optimize therapy   03/30/20 1427 03/31/20 1430   03/30/20 1330  piperacillin-tazobactam (ZOSYN) IVPB 3.375 g     Discontinue     03/30/20 1324         Devices    LINES / TUBES:  Continuous Infusions: . piperacillin-tazobactam (ZOSYN)  IV      Physical Exam: Vitals:   03/30/20 1203 03/30/20 1252 03/30/20 1256 03/30/20 1300  BP: (!) 107/48 (!) 123/54    Pulse:  (!) 111  (!) 102  Resp:  (!) 22  (!) 21  Temp:   (!) 97.5 F (36.4 C)   TempSrc:   Oral   SpO2:  97%  97%  Weight:      Height:        Wt Readings from Last 3 Encounters:  03/30/20 53.5 kg  12/06/19 57.6 kg  09/05/19 64.6 kg    General: A/O x4 no acute respiratory distress Eyes: negative scleral hemorrhage, negative anisocoria, negative icterus ENT: Negative Runny nose, negative gingival bleeding, Neck:  Negative scars, masses, torticollis, lymphadenopathy, JVD Lungs: Clear to auscultation bilaterally without wheezes or crackles Cardiovascular: Regular rate and rhythm without murmur gallop or rub normal S1 and S2 Abdomen: Positive abdominal pain, nondistended, negative/minimal soft, bowel sounds, no rebound,  no ascites, no appreciable mass Extremities: Bilateral upper extremities retracted consistent with ALS, bilateral lower extremity retraction to a lesser extent. Skin: Negative rashes, lesions, ulcers Psychiatric:  Negative depression, negative anxiety, negative fatigue, negative mania  Central nervous system:  Cranial nerves II through XII intact, tongue/uvula midline, upper extremities muscle strength 1- 2/5, lower extremities muscle strength 3/5, sensation intact throughout, negative dysarthria, negative expressive aphasia, negative receptive aphasia.        Labs on Admission:  Basic Metabolic Panel: Recent Labs  Lab 03/30/20 1109  NA 141  K 4.4  CL 106  CO2 25  GLUCOSE 124*  BUN 13  CREATININE 0.45  CALCIUM 8.8*   Liver Function Tests: Recent Labs  Lab 03/30/20 1109  AST 17  ALT 12  ALKPHOS 75  BILITOT 0.6  PROT 6.4*  ALBUMIN 3.5   Recent Labs  Lab 03/30/20 1109  LIPASE 113*   No results for input(s): AMMONIA in the last 168 hours. CBC: Recent Labs  Lab 03/30/20 1109  WBC 12.8*  NEUTROABS 11.3*  HGB 12.2  HCT 39.4  MCV 92.5  PLT 436*   Cardiac Enzymes: No results for input(s): CKTOTAL, CKMB, CKMBINDEX, TROPONINI in the last 168 hours.  BNP (last 3 results) No results for input(s): BNP in the last 8760 hours.  ProBNP (last 3 results) No results for input(s): PROBNP in the last 8760 hours.  CBG: No results for input(s): GLUCAP in the last 168 hours.  Radiological Exams on Admission: DG Abdomen Acute W/Chest  Result Date: 03/30/2020 CLINICAL DATA:  Epigastric pain radiating to right side. History of ALS. EXAM: DG ABDOMEN ACUTE W/ 1V CHEST COMPARISON:  Chest x-ray 09/05/2019 and abdominal films 03/03/2020 FINDINGS: Patient is slightly rotated to the right. Lungs are hypoinflated but otherwise clear. Cardiomediastinal silhouette and remainder of the chest is unremarkable. Abdominopelvic images demonstrate several air-filled dilated central small bowel  loops measuring up to 4.2 cm in diameter. Mild amount of air and stool throughout the colon. Mild gastric distension. No free peritoneal air. Remainder the exam is unchanged. IMPRESSION: 1. Several dilated air-filled central small bowel loops with mild air throughout the colon. Findings may be seen and ileus versus early/partial small bowel obstruction. 2.  Hypoinflation without acute cardiopulmonary disease. Electronically Signed   By: Marin Olp M.D.   On: 03/30/2020 12:52   CT Angio Chest/Abd/Pel for Dissection W and/or Wo Contrast  Result Date: 03/30/2020 CLINICAL DATA:  Epigastric pain radiating into right abdomen and back. History  of ALS. EXAM: CT ANGIOGRAPHY CHEST, ABDOMEN AND PELVIS TECHNIQUE: Non-contrast CT of the chest was initially obtained. Multidetector CT imaging through the chest, abdomen and pelvis was performed using the standard protocol during bolus administration of intravenous contrast. Multiplanar reconstructed images and MIPs were obtained and reviewed to evaluate the vascular anatomy. CONTRAST:  125mL OMNIPAQUE IOHEXOL 350 MG/ML SOLN COMPARISON:  CT of the abdomen and pelvis on 03/01/2020 FINDINGS: CTA CHEST FINDINGS Cardiovascular: The thoracic aorta is normal in caliber and demonstrates no evidence of dissection. There is calcified plaque at the level of the aortic arch. Visualized proximal great vessels are normally patent. Pulmonary arteries are also well opacified and demonstrate normal patency and normal caliber. The heart size is normal. No pericardial fluid identified. No significant calcified coronary artery plaque identified. Mediastinum/Nodes: No enlarged mediastinal, hilar, or axillary lymph nodes. Thyroid gland, trachea, and esophagus demonstrate no significant findings. Lungs/Pleura: Bibasilar atelectasis present. There is a trace amount pleural fluid bilaterally, right greater than left. There is no evidence of pulmonary edema, focal airspace consolidation,  pneumothorax, nodule or pleural fluid. Musculoskeletal: No chest wall abnormality. No acute or significant osseous findings. Review of the MIP images confirms the above findings. CTA ABDOMEN AND PELVIS FINDINGS VASCULAR Aorta: Calcified plaque in the abdominal aorta without evidence of aneurysmal disease or dissection. Celiac: Normally patent.  Normal branch vessel patency. SMA: Normally patent. Renals: Two separate left and two separate right renal arteries demonstrate normal patency. The lower pole artery on the right emanates off of the distal aorta nearly at the aortic bifurcation. IMA: Normally patent. Inflow: Normally patent bilateral iliac arteries and common femoral arteries. Review of the MIP images confirms the above findings. NON-VASCULAR Hepatobiliary: No focal liver abnormality is seen. No gallstones, gallbladder wall thickening, or biliary dilatation. Pancreas: Unremarkable. No pancreatic ductal dilatation or surrounding inflammatory changes. Spleen: Normal in size without focal abnormality. Adrenals/Urinary Tract: Adrenal glands are unremarkable. Kidneys are normal, without renal calculi, focal lesion, or hydronephrosis. Bladder is unremarkable. Stomach/Bowel: Moderate free intraperitoneal air is present in the peritoneal cavity. The stomach is distended with air and there is some potential inflammation and thickening of the proximal duodenum. At the level of the mid and lower abdomen, there are multiple dilated small bowel loops reaching maximal caliber of approximately 4 cm. The dilated loops are inflamed in appearance and hyperemic. One transversely oriented mid small bowel loop appears thickened and there is a layer of free intraperitoneal air anterior to this bowel loop. Lymphatic: No enlarged lymph nodes identified. Reproductive: Status post hysterectomy. No adnexal masses. Other: Small amount of free fluid in the pelvis and interspersed between some lower dilated small bowel loops. No focal  abscess. Musculoskeletal: No acute or significant osseous findings. Review of the MIP images confirms the above findings. IMPRESSION: 1. No evidence of aortic dissection. 2. Moderate free intraperitoneal air in the peritoneal cavity. At the level of the mid and lower abdomen, there are multiple dilated small bowel loops reaching maximal caliber of approximately 4 cm. The dilated loops are inflamed in appearance and hyperemic. One of the mid small bowel loops appears decompressed and thickened and there is a layer of free intraperitoneal air anterior to this bowel loop. Findings are consistent with a partial small bowel obstruction/ileus and small bowel perforation may be the source of free air. 3. Possible inflammation and thickening of the proximal duodenum. Perforated duodenal ulcer also cannot be excluded as a potential source for free intraperitoneal air. 4. Trace amount of pleural fluid  bilaterally, right greater than left. 5. Small amount of free fluid in the abdomen and pelvis. These results were called by telephone at the time of interpretation on 03/30/2020 at 1:25 PM to provider [facility] , who verbally acknowledged these results. Electronically Signed   By: Aletta Edouard M.D.   On: 03/30/2020 13:34    EKG: Independently reviewed.   Assessment/Plan Principal Problem:   Pneumoperitoneum Active Problems:   Muscle weakness (generalized)   ALS (amyotrophic lateral sclerosis) (HCC)   Bilateral arm weakness   Decreased sensation of lower extremity   Collagenous colitis   Small bowel obstruction (HCC)  Pneumoperitoneum -Emergency surgery by Dr. Remo Lipps gross [facility] -Patient and husband understand patient will have a difficult time weaning from vent secondary to ALS -continue antibiotics per surgery's preference  Collagenous colitis -Most likely contributed to pneumoperitoneum  SBO -See pneumoperitoneum  ALS -Spoke with RN Duayne Cal charge nurse in ICU requested that one family  member be allowed to stay overnight with patient secondary to her disability.  He agreed this would be appropriate.    Code Status: DNR (DVT Prophylaxis: For emergent surgery Family Communication: 6/9 husband present for discussion of plan of care  Status is: Inpatient    Dispo: The patient is from: Home              Anticipated d/c is to: Home              Anticipated d/c date is: Per surgery              Patient currently unstable     Data Reviewed: Care during the described time interval was provided by me .  I have reviewed this patient's available data, including medical history, events of note, physical examination, and all test results as part of my evaluation.   The patient is critically ill with multiple organ systems failure and requires high complexity decision making for assessment and support, frequent evaluation and titration of therapies, application of advanced monitoring technologies and extensive interpretation of multiple databases. Critical Care Time devoted to patient care services described in this note  Time spent: 27 minutes   Lucianna Ostlund, [facility] Pager [phone]

## 2020-03-30 NOTE — Anesthesia Postprocedure Evaluation (Signed)
Anesthesia Post Note  Patient: Lori Alvarez  Procedure(s) Performed: EXPLORATORY LAPAROTOMY omental gram patch small bowel resection (N/A Abdomen)     Patient location during evaluation: PACU Anesthesia Type: General Level of consciousness: oriented, sedated and patient cooperative Pain management: pain level controlled Vital Signs Assessment: post-procedure vital signs reviewed and stable Respiratory status: spontaneous breathing, nonlabored ventilation, respiratory function stable and patient connected to nasal cannula oxygen Cardiovascular status: blood pressure returned to baseline and stable Postop Assessment: no apparent nausea or vomiting Anesthetic complications: no   No complications documented.  Last Vitals:  Vitals:   03/30/20 1915 03/30/20 1930  BP: (!) 113/56 (!) 112/50  Pulse: (!) 114 (!) 114  Resp: 17 17  Temp:    SpO2: 100% 100%    Last Pain:  Vitals:   03/30/20 1855  TempSrc:   PainSc: 0-No pain                 [name],E. Jaley Yan

## 2020-03-30 NOTE — Progress Notes (Signed)
Patient refuses second blood culture.

## 2020-03-30 NOTE — Op Note (Signed)
03/30/2020  6:37 PM  PATIENT:  Lori Alvarez  59 y.o. 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  See anesthesia record  Delay start of Pharmacological VTE agent (>24hrs) due to surgical blood loss or risk of bleeding:  no  DRAINS: 20 [place] drain goes from left paramedian along lesser curve of stomach duodenum to right hepatic flexure.  SPECIMEN:  ILEUM  DISPOSITION OF SPECIMEN:  PATHOLOGY  COUNTS:  YES  PLAN OF CARE: Admit to inpatient   PATIENT DISPOSITION:  PACU - guarded condition.  INDICATION: Patient with ALS and upper extremity weakness.  Cared by husband.  Admitted twice this year for bloating and probable enteritis versus obstruction.  Resolve nonoperatively.  Awoke this morning with severe sharp chest pain and upper abdominal pain.  Came to emergency room.  Cardiopulmonary etiologies ruled out.  CT scan showing free air with gas and duodenal bulb suspicious  for perforation.  Rather dilated small bowel with probable obstruction and distal small bowel.  Options were discussed including hospice/comfort care with her ALS versus being aggressive and emergent operation.  After discussion with the patient and her husband she wished to proceed with surgery.  Husband supported her decision.  The anatomy & physiology of the digestive tract was discussed.  The pathophysiology of perforation was discussed.  Differential diagnosis such as perforated ulcer or colon, etc was discussed.   Natural history risks without surgery such as death was discussed.  I recommended abdominal exploration to diagnose & treat the source of the problem.  Laparoscopic & open techniques were discussed.   Risks such as bleeding, infection, abscess, leak, reoperation, bowel resection, possible ostomy, injury to other organs, need for repair of tissues / organs, hernia, heart attack, death, and other risks were discussed.   The risks of no intervention will lead to serious problems including death.   I expressed a good likelihood that surgery will address the problem.    Goals of post-operative recovery were discussed as well.  We will work to minimize complications although risks in an emergent setting are high.   Questions were answered.  The patient expressed understanding & wishes to proceed with surgery.      OR FINDINGS: Massive pneumoperitoneum.  Punched out duodenal perforation at the bulb classic for ulcer.  Swelling, phlegmon, and thickening in the region.  No evidence of carcinomatosis.  Biliary spillage with ascites and peritonitis.  Purulence.  Dilated small bowel with stricture at mid ileum.  Seemed almost like intraluminal mass.  Source of chronic obstruction.  Chronically dilated ileum proximal this with equalization.  Small  bowel resection done with side to side anastomosis in [place] type antiperistaltic fashion.  CASE DATA:  Type of patient?: LDOW CASE (Surgical  Hospitalist [facility] Inpatient)  Status of Case? EMERGENT Add On  Infection Present At Time Of Surgery (PATOS)?  PHLEGMON & PURULENCE  DESCRIPTION:    Informed consent was confirmed.   The patient received IV antibiotics and underwent general anesthesia without any difficulty. The patient was positioned appropriately. Foley catheter had been sterilely placed. SCDs were active during the entire case.  The abdomen was prepped and draped in a sterile fashion.  Surgical timeout confirmed our plan.    Entry was gained through an midline incision.  Upon entering the abdomen (organ space), I encountered pneumoperitoneum.  Extend the incision to be upper midline.  Placed a large wound protector.  Encountered some purulence and bilious ascites.  There were a few adhesions of omentum in the abdomen and pelvis but rather spare.  I was able to eviscerate the small bowel.  Proximal jejunum appeared to be normal caliber but then became more dilated in the ileum.  Sharp transition point with a area of serosal bandlike thickening stricture causing a transition point.  Distal foot of ileum very decompressed.  No obvious lymphadenopathy in the mesentery.  Concern for chronic stricture most likely explaining or the result of prior episodes of ileitis and prior admissions.  Inspected the upper abdomen.  Had the nasogastric tube advanced with the tip going down the antrum to good result in stomach better decompressed.  The palpate an obvious thickening at the duodenal bulb.  Is able to mobilize and kocherized somewhat to identify a phlegmon involving the Duodenal bulb  with purulence consistent with perforated ulcer.  5 mm punched out opening primarily anterior noted.  I aspirated ascites.  I found a thick healthy tongue of omentum along the distal greater curvature of the stomach.  I pedicle lysed that until it could reach over the duodenal bulb well.  I used 2-0 PDS suture from the antrum through the omentum and then the second  part of the duodenum times 3 interrupted stitches and tied that down for a good omentum to plug up the ulcer in a classic Graham patch fashion.  I then reran the small bowel and confirmed the mid ileal stricture which was a sharp transition point.  I went ahead and proceeded with small bowel resection.  I transected the mesentery in a radially fashion from the mid ileum that was dilated to the more distal ileum that was decompressed with a vessel sealer.  I did a mid-ileum to distal-ileum side to side stapled anastomosis antiperistaltic using a 75 GIA firing.  I then used a TX 90 to transect off the ileum and common defect.  I used the redundant ileal mesentery to lay over the [place] staple line and used interrupted silk sutures to use a mesenteric patch to cover and protect the [place] staple line while closing the mesenteric defect in the ileum.  Therefore closing the mesenteric defect transversely and protecting the staple line to good result.  Placed a silk suture at the proximal crotch of the anastomosis to protect it for any antitension suture.  I then did copious irrigation of 7 L of isotonic fluid.  I did a final irrigation of antibiotic solution (clindamycin/gentamicin).  Held that for 10 minutes and partially aspirated it.  Because of the moderate contamination with some purulence and phlegmon in the right upper quadrant I placed a drain as noted above.  Secured the skin with Prolene suture.  I removed the wound protector and changed gloves.  Hemostasis was good.  I found some remaining omentum to lay up in the upper abdomen.  I closed the fascia using #1 PDS in a running fashion and from superior and inferior corners.  Because of the infection present the time of surgery, I left the subcutaneous tissues open and placed a gray sponge wound VAC over it to good result.  Patient was extubated in the operating room and looks relatively comfortable.  I did a TAP block.  Receiving IV acetaminophen as well.  We will  watch her in the intensive care unit closely.  I discussed operative findings, updated the patient's status, discussed probable steps to recovery, and gave postoperative recommendations to the patient's spouse.  Recommendations were made.  Questions were answered.  He expressed understanding & appreciation.   Adin Hector, M.D., F.A.C.S. Gastrointestinal and Minimally Invasive Surgery [facility] [address] [place], [place] [address] [phone] Main / Paging

## 2020-03-30 NOTE — Anesthesia Procedure Notes (Signed)
Procedure Name: Intubation Performed by: Gean Maidens, CRNA Pre-anesthesia Checklist: Patient identified, Emergency Drugs available, Suction available, Patient being monitored and Timeout performed Patient Re-evaluated:Patient Re-evaluated prior to induction Oxygen Delivery Method: Circle system utilized Preoxygenation: Pre-oxygenation with 100% oxygen Induction Type: Rapid sequence Laryngoscope Size: Glidescope and 3 Grade View: Grade I Tube type: Oral Tube size: 7.0 mm Number of attempts: 1 Airway Equipment and Method: Stylet and Video-laryngoscopy Placement Confirmation: ETT inserted through vocal cords under direct vision,  positive ETCO2 and breath sounds checked- equal and bilateral Secured at: 21 cm Tube secured with: Tape Dental Injury: Teeth and Oropharynx as per pre-operative assessment

## 2020-03-31 ENCOUNTER — Encounter (HOSPITAL_COMMUNITY): Payer: Self-pay | Admitting: Surgery

## 2020-03-31 DIAGNOSIS — R531 Weakness: Secondary | ICD-10-CM

## 2020-03-31 DIAGNOSIS — K265 Chronic or unspecified duodenal ulcer with perforation: Principal | ICD-10-CM

## 2020-03-31 DIAGNOSIS — Z7189 Other specified counseling: Secondary | ICD-10-CM

## 2020-03-31 DIAGNOSIS — R52 Pain, unspecified: Secondary | ICD-10-CM

## 2020-03-31 DIAGNOSIS — Z515 Encounter for palliative care: Secondary | ICD-10-CM

## 2020-03-31 LAB — CBC WITH DIFFERENTIAL/PLATELET
Abs Immature Granulocytes: 0.06 10*3/uL (ref 0.00–0.07)
Basophils Absolute: 0 10*3/uL (ref 0.0–0.1)
Basophils Relative: 0 %
Eosinophils Absolute: 0 10*3/uL (ref 0.0–0.5)
Eosinophils Relative: 0 %
HCT: 32.8 % — ABNORMAL LOW (ref 36.0–46.0)
Hemoglobin: 10.5 g/dL — ABNORMAL LOW (ref 12.0–15.0)
Immature Granulocytes: 1 %
Lymphocytes Relative: 4 %
Lymphs Abs: 0.5 10*3/uL — ABNORMAL LOW (ref 0.7–4.0)
MCH: 28.6 pg (ref 26.0–34.0)
MCHC: 32 g/dL (ref 30.0–36.0)
MCV: 89.4 fL (ref 80.0–100.0)
Monocytes Absolute: 0.4 10*3/uL (ref 0.1–1.0)
Monocytes Relative: 3 %
Neutro Abs: 11 10*3/uL — ABNORMAL HIGH (ref 1.7–7.7)
Neutrophils Relative %: 92 %
Platelets: 392 10*3/uL (ref 150–400)
RBC: 3.67 MIL/uL — ABNORMAL LOW (ref 3.87–5.11)
RDW: 15.9 % — ABNORMAL HIGH (ref 11.5–15.5)
WBC: 12 10*3/uL — ABNORMAL HIGH (ref 4.0–10.5)
nRBC: 0 % (ref 0.0–0.2)

## 2020-03-31 LAB — PHOSPHORUS: Phosphorus: 2.5 mg/dL (ref 2.5–4.6)

## 2020-03-31 LAB — COMPREHENSIVE METABOLIC PANEL
ALT: 14 U/L (ref 0–44)
AST: 19 U/L (ref 15–41)
Albumin: 3.5 g/dL (ref 3.5–5.0)
Alkaline Phosphatase: 46 U/L (ref 38–126)
Anion gap: 12 (ref 5–15)
BUN: 7 mg/dL (ref 6–20)
CO2: 20 mmol/L — ABNORMAL LOW (ref 22–32)
Calcium: 8.4 mg/dL — ABNORMAL LOW (ref 8.9–10.3)
Chloride: 106 mmol/L (ref 98–111)
Creatinine, Ser: 0.37 mg/dL — ABNORMAL LOW (ref 0.44–1.00)
GFR calc Af Amer: >60 mL/min (ref >60)
GFR calc non Af Amer: >60 mL/min (ref >60)
Glucose, Bld: 108 mg/dL — ABNORMAL HIGH (ref 70–99)
Potassium: 4.1 mmol/L (ref 3.5–5.1)
Sodium: 138 mmol/L (ref 135–145)
Total Bilirubin: 0.6 mg/dL (ref 0.3–1.2)
Total Protein: 6.1 g/dL — ABNORMAL LOW (ref 6.5–8.1)

## 2020-03-31 LAB — LACTIC ACID, PLASMA: Lactic Acid, Venous: 1.3 mmol/L (ref 0.5–1.9)

## 2020-03-31 LAB — PREALBUMIN: Prealbumin: 17.7 mg/dL — ABNORMAL LOW (ref 18–38)

## 2020-03-31 LAB — ABO/RH: ABO/RH(D): O NEG

## 2020-03-31 LAB — MAGNESIUM: Magnesium: 1.7 mg/dL (ref 1.7–2.4)

## 2020-03-31 MED ORDER — PROMETHAZINE HCL 25 MG/ML IJ SOLN
6.2500 mg | Freq: Four times a day (QID) | INTRAMUSCULAR | Status: DC | PRN
  Administered 2020-03-31 (×2): 6.25 mg via INTRAVENOUS
  Filled 2020-03-31 (×2): qty 1

## 2020-03-31 MED ORDER — FENTANYL CITRATE (PF) 100 MCG/2ML IJ SOLN
12.5000 ug | INTRAMUSCULAR | Status: DC | PRN
  Administered 2020-03-31 – 2020-04-02 (×7): 12.5 ug via INTRAVENOUS
  Filled 2020-03-31 (×7): qty 2

## 2020-03-31 MED ORDER — ONDANSETRON HCL 4 MG/2ML IJ SOLN
4.0000 mg | Freq: Three times a day (TID) | INTRAMUSCULAR | Status: DC
  Administered 2020-03-31 – 2020-04-01 (×2): 4 mg via INTRAVENOUS
  Filled 2020-03-31: qty 2

## 2020-03-31 MED ORDER — MORPHINE SULFATE (PF) 2 MG/ML IV SOLN: 2.0000 mg | INTRAVENOUS | Status: DC | PRN

## 2020-03-31 MED ORDER — ONDANSETRON HCL 4 MG/2ML IJ SOLN
4.0000 mg | Freq: Four times a day (QID) | INTRAMUSCULAR | Status: DC | PRN
  Administered 2020-03-31 – 2020-04-02 (×6): 4 mg via INTRAVENOUS
  Filled 2020-03-31 (×7): qty 2

## 2020-03-31 MED ORDER — PIPERACILLIN-TAZOBACTAM 3.375 G IVPB
3.3750 g | Freq: Three times a day (TID) | INTRAVENOUS | Status: DC
  Administered 2020-03-31 – 2020-04-01 (×4): 3.375 g via INTRAVENOUS
  Filled 2020-03-31 (×5): qty 50

## 2020-03-31 MED ORDER — MORPHINE SULFATE (PF) 2 MG/ML IV SOLN
2.0000 mg | INTRAVENOUS | Status: DC | PRN
  Filled 2020-03-31: qty 1

## 2020-03-31 MED ORDER — CHLORHEXIDINE GLUCONATE CLOTH 2 % EX PADS
6.0000 | MEDICATED_PAD | Freq: Every day | CUTANEOUS | Status: DC
  Administered 2020-04-01: 6 via TOPICAL

## 2020-03-31 MED ORDER — ORAL CARE MOUTH RINSE
15.0000 mL | Freq: Two times a day (BID) | OROMUCOSAL | Status: DC
  Administered 2020-03-31 (×2): 15 mL via OROMUCOSAL

## 2020-03-31 MED ORDER — LACTATED RINGERS IV SOLN: INTRAVENOUS | Status: DC

## 2020-03-31 NOTE — Consult Note (Signed)
Consultation Note Date: 03/31/2020   Patient Name: Lori Alvarez  DOB: [birth year]  MRN: [id number]  Age / Sex: 59 y.o., female  PCP: Shirline Frees, MD Referring Physician: Patrecia Pour, MD  Reason for Consultation: Establishing goals of care, Non pain symptom management and Pain control  HPI/Patient Profile: 59 y.o. female  with past medical history of  ALS, follows at [facility], also with history of MDS, SBO, Appendectomy, collagenous colitis, partial Hysterectomy, postherpetic neuralgia.  Generalized muscle weakness admitted on 03/30/2020 with pneumoperitoneum SBO.   Clinical Assessment and Goals of Care:  Patient lives at home with husband, admitted with acute severe chest and abdominal pain, CT with free air with gas and duodenal bulb suspicious for perforation, dilated small bowel with probable obstruction. She underwent emergency surgery with OR findings of massive pneumoperitoneum, duodenal perforation at bulb, dilated small bowel with stricture, possible source of chronic obstruction. Post op day 1 today on 03-31-20.   A palliative consult has been requested for ongoing support and additional palliative care.   Lori Alvarez is awake alert, resting in bed, has NGT, husband is at bedside. I introduced myself and palliative care as follows:  Palliative medicine is specialized medical care for people living with serious illness. It focuses on providing relief from the symptoms and stress of a serious illness. The goal is to improve quality of life for both the patient and the family.  Goals of care: Broad aims of medical therapy in relation to the patient's values and preferences. Our aim is to provide medical care aimed at enabling patients to achieve the goals that matter most to them, given the circumstances of their particular medical situation and their constraints.   I checked in with Lori Alvarez about  how she is feeling. She describes feeling "pretty miserable" today, with generalized weakness, still with ongoing abdominal discomfort at times, NGT she says is bothering the back of her throat, at times, she still feels nauseous. She states her neck hurts, she is not able to lift her neck by herself due to her ALS. Offered active listening and supportive presence, we checked in with each other about appropriate pain and non pain symptom management. See additional discussions as listed below.   NEXT OF KIN  husband.   DISCUSSIONS/SUMMARY OF RECOMMENDATIONS    Pain and non pain symptom management and goals of care:   Home medication list noted, discussed with patient and husband about patient's pain medications. Patient takes extra strength tylenol at home, has had nausea/vomiting in the past with most opioids. Agree with opioid rotating from Dilaudid to morphine, will also have low dose Fentanyl IV PRN available, agree with Tylenol. Continue current NGT and nausea medications, will also add scheduled IV Zofran, continue other supportive measures such as ice pack to neck area/heat when patient wants. Continue supportive care, appreciate RN assistance. Goals, wishes and values important to patient discussed, offered active listening and supportive presence, acknowledged the patient's descriptions and experiences of her current and underlying serious illness.  Recommend home with home health, home based palliative care versus SNF rehab attempt with palliative following on discharge. This will depend on her hospital course and overall disease trajectory.   PMT to follow. Thank you for the consult.   Code Status/Advance Care Planning:  Listed as prior, she is wearing a purple bracelet. At present, with symptoms of pain and nausea on post op day 1, continue to monitor and look for place to re address and re verify code status.    Symptom Management:   As above.   Palliative Prophylaxis:   Frequent  Pain Assessment  Psycho-social/Spiritual:   Desire for further Chaplaincy support:yes  Additional Recommendations: Caregiving  Support/Resources  Prognosis:   Unable to determine  Discharge Planning: To Be Determined      Primary Diagnoses: Present on Admission: . Pneumoperitoneum . ALS (amyotrophic lateral sclerosis) ([place]) . Muscle weakness (generalized) . Bilateral arm weakness . Collagenous colitis . Post herpetic neuralgia . MDS (myelodysplastic syndrome) ([place]) . Abdominal pain   I have reviewed the medical record, interviewed the patient and family, and examined the patient. The following aspects are pertinent.  Past Medical History:  Diagnosis Date  . ALS (amyotrophic lateral sclerosis) ([place])   . Postherpetic neuralgia   . SBO (small bowel obstruction) ([place]) 12/06/2019   Social History   Socioeconomic History  . Marital status: Married    Spouse name: Not on file  . Number of children: 0  . Years of education: 63  . Highest education level: Not on file  Occupational History  . Occupation: [employer]  Tobacco Use  . Smoking status: Never Smoker  . Smokeless tobacco: Never Used  Vaping Use  . Vaping Use: Never used  Substance and Sexual Activity  . Alcohol use: No  . Drug use: No  . Sexual activity: Not on file  Other Topics Concern  . Not on file  Social History Narrative   Lives with husband   Right handed    Caffeine use: Coke- 12 oz or less    Social Determinants of Radio broadcast assistant Strain:   . Difficulty of Paying Living Expenses:   Food Insecurity:   . Worried About Charity fundraiser in the Last Year:   . Arboriculturist in the Last Year:   Transportation Needs:   . Film/video editor (Medical):   Marland Kitchen Lack of Transportation (Non-Medical):   Physical Activity:   . Days of Exercise per Week:   . Minutes of Exercise per Session:   Stress:   . Feeling of Stress :   Social Connections:   . Frequency of  Communication with Friends and Family:   . Frequency of Social Gatherings with Friends and Family:   . Attends Religious Services:   . Active Member of Clubs or Organizations:   . Attends Archivist Meetings:   Marland Kitchen Marital Status:    Family History  Problem Relation Age of Onset  . Myopathy Mother   . Cancer Father        renal   Scheduled Meds: . sodium chloride   Intravenous Once  . lip balm  1 application Topical BID  . mouth rinse  15 mL Mouth Rinse BID  . mupirocin ointment  1 application Nasal BID  . ondansetron (ZOFRAN) IV  4 mg Intravenous Q8H  . [START ON 04/03/2020] pantoprazole  40 mg Intravenous Q12H   Continuous Infusions: . acetaminophen Stopped (03/31/20 0612)  . lactated ringers    .  lactated ringers 75 mL/hr at 03/31/20 0917  . methocarbamol (ROBAXIN) IV Stopped (03/31/20 0709)  . pantoprozole (PROTONIX) infusion 8 mg/hr (03/31/20 0800)  . piperacillin-tazobactam (ZOSYN)  IV 3.375 g (03/31/20 0925)   PRN Meds:.acetaminophen, alum & mag hydroxide-simeth, bisacodyl, fentaNYL (SUBLIMAZE) injection, guaiFENesin-dextromethorphan, hydrocortisone, hydrocortisone cream, lactated ringers, LORazepam, magic mouthwash, menthol-cetylpyridinium, morphine injection, ondansetron (ZOFRAN) IV, phenol, promethazine Medications Prior to Admission:  Prior to Admission medications   Medication Sig Start Date End Date Taking? Authorizing Provider  acetaminophen (TYLENOL) 500 MG tablet Take 500 mg by mouth in the morning, at noon, in the evening, and at bedtime.   Yes [provider]  ALPRAZolam (XANAX) 0.5 MG tablet Take 0.25-0.5 mg by mouth 2 (two) times daily. Take 1/2 tablet (0.25 mg) in the morning and Take 1 tablet (0.5 mg) at bedtime   Yes [provider]  baclofen (LIORESAL) 10 MG tablet Take 10 mg by mouth 4 (four) times daily.  09/16/19  Yes [provider]  Black Cohosh (REMIFEMIN) 20 MG TABS Take 20 mg by mouth every evening.    Yes [provider]  Cyanocobalamin (VITAMIN B-12 PO) Take 500 mcg by mouth daily.   Yes [provider]  DULoxetine (CYMBALTA) 20 MG capsule Take 20 mg by mouth every evening.  12/02/19  Yes [provider]  hyoscyamine (LEVBID) 0.375 MG 12 hr tablet Take 0.375 mg by mouth every 12 (twelve) hours.   Yes [provider]  melatonin 5 MG TABS Take 5 mg by mouth at bedtime.   Yes [provider]  polycarbophil (FIBERCON) 625 MG tablet Take 625 mg by mouth daily.   Yes [provider]  pregabalin (LYRICA) 50 MG capsule Take 50-100 mg by mouth See admin instructions. Take 100 mg (2 capsules) at 10 am, take 50 mg (1 capsules) at 2 pm, take 100 mg (2 capsules) at 6 pm, and take 50 mg (1 capsule) at 10 pm. 12/02/19  Yes [provider]   Allergies  Allergen Reactions  . Nsaids Other (See Comments)    PERFORATED ULCER = NO NSAIDS  . Allegra [Fexofenadine]     Ringing in ears   . Clotrim-Undecyl-Tea Tr-Lav Oil     unknown  . Doxepin     Headaches  . Nitroglycerin     Migraine  . Keflex [Cephalexin]     "too strong, made me drowsy and dizzy"  . Nitrofuran Derivatives Rash  . Sulfa Antibiotics Rash   Review of Systems +generalized pain +nausea at times  Physical Exam Has NGT Diffuse muscle weakness Regular work of breathing Appears pale with generalized weakness S1 S2 Has chronic contractures.   Vital Signs: BP (!) 113/45   Pulse (!) 108   Temp 99.1 F (37.3 C) (Axillary)   Resp 18   Ht 5' 2.99" (1.6 m)   Wt 53.5 kg   SpO2 97%   BMI 20.91 kg/m  Pain Scale: 0-10 POSS *See Group Information*: 1-Acceptable,Awake and alert Pain Score: 2    SpO2: SpO2: 97 % O2 Device:SpO2: 97 % O2 Flow Rate: .O2 Flow Rate (L/min): 2 L/min  IO: Intake/output summary:   Intake/Output Summary (Last 24 hours) at 03/31/2020 1056 Last data filed at 03/31/2020 1003 Gross per 24 hour  Intake 6139.02 ml  Output 3160 ml  Net 2979.02 ml    LBM: Last  BM Date:  (PTA) Baseline Weight: Weight: 53.5 kg Most recent weight: Weight: 53.5 kg     Palliative Assessment/Data:  PPS 40%  Time In: 10  Time Out: 11  Time Total:  60   Greater than 50%  of this time was spent counseling and coordinating care related to the above assessment and plan.  Signed by: Loistine Chance, MD   Please contact Palliative Medicine Team phone at [phone] for questions and concerns.  For individual provider: See Shea Evans

## 2020-03-31 NOTE — Progress Notes (Addendum)
Lori Alvarez [id number] [birth year]  CARE TEAM:  PCP: Shirline Frees, MD  Outpatient Care Team: Patient Care Team: Shirline Frees, MD as PCP - General (Family Medicine) Kristeen Miss, MD as Consulting Physician (Neurosurgery) Melvyn Novas, MD as Consulting Physician (Neurology) Otis Brace, MD as Consulting Physician (Gastroenterology) Deliah Goody, PA-C as Physician Assistant (Gastroenterology) Michael Boston, MD as Consulting Physician (General Surgery)  Inpatient Treatment Team: Treatment Team: Attending Provider: Patrecia Pour, MD; Consulting Physician: Nolon Nations, MD; Registered Nurse: Julio Alm, RN; Consulting Physician: Threasa Beards, MD; Consulting Physician: Patrecia Pour, MD; Occupational Therapist: Randon Goldsmith, OT; Physical Therapist: Neil Crouch, PT; Registered Nurse: Mervyn Skeeters, RN   Problem List:   Principal Problem:   Duodenal bulb ulcer with perforation s/p omental Phillip Heal patch 03/30/2020 Active Problems:   ALS (amyotrophic lateral sclerosis) ([place])   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   Bilateral arm weakness   Muscle weakness (generalized)   Decreased sensation of lower extremity   MDS (myelodysplastic syndrome) ([place])   Post herpetic neuralgia   Collagenous colitis   Abdominal pain   Pneumoperitoneum   Migraines   Weight loss, unintentional   1 Day Post-Op  03/30/2020  POST-OPERATIVE DIAGNOSIS:  DUODENAL PERFORATION (PROBABLE ULCER) ILEAL STRICTURE WITH SMALL BOWEL OBSTRUCTION  PROCEDURE:   EXPLORATORY LAPAROTOMY OMENTAL GRAHAM PATCH ILEAL RESECTION TAP BLOCK - BILATERAL  SURGEON:  Adin Hector, MD  Assessment  Guarded but stabilizing  [facility] Stay = 1 days)  Plan:  IV antibiotics.  I reordered Zosyn.  Somehow dropped off.  Needs 5 days minimum.  Aggressive PPI coverage for large perforated duodenal ulcer.  Would benefit from outpatient EGD by her  gastroenterologist in about 6-8 weeks to make sure ulcer has resolved.  Internal drainage with NG tube.  External drainage with Blake drain.  Check drain amylase on Monday to help disprove leak.  If no elevated amylase nor leak and tolerating solid food, can remove drain at the end of this hospitalization.  Most likely prior to discharge home.  Continue NG tube until ileus resolved.  Once has flatus, can do clamping trial versus upper GI depending how she is doing to make sure that the leak remains sealed.  Switch from Dilaudid to morphine since she was nauseated with Dilaudid.  Continue scheduled Tylenol as much as possible.  Pharmacy will not allow me on 24 hours.  Robaxin will o'clock.  Ice/heat.  Try to mobilize.  Challenging this woman with significant upper extremity weakness.  Keep in stepdown today.  Management of ALS and other medical conditions per primary medicine service.  VTE prophylaxis- SCDs, etc  Mobilize as tolerated to help recovery.  Challenging this patient with moderately severe ALS and limited lumbar extremity use.  I suspect she would benefit from skilled nursing facility or rehab to recover.  Most likely patient motivated to go home to be with husband.  30 minutes spent in review, evaluation, examination, counseling, and coordination of care.  More than 50% of that time was spent in counseling.  I updated the patient's status to the patient.  Recommendations were made.  Questions were answered.  She expressed understanding & appreciation.   03/31/2020    Subjective: (Chief complaint)  No major events in the intensive care unit.  Nauseated with Dilaudid.    Objective:  Vital signs:  Vitals:   03/31/20 0400 03/31/20 0500 03/31/20 0600 03/31/20 0700  BP: (!) 106/39 (!) 113/45 (!) 119/45 (!) 113/45  Pulse: (!) 106 (!) 113 (!) 109 (!) 110  Resp: 15 16 18  (!) 32  Temp:      TempSrc:      SpO2: 99% 99% 100% 98%  Weight:      Height:            Intake/Output   Yesterday:  06/12 0701 - 06/13 0700 In: 5725.8 [I.V.:2688.6; IV Piggyback:3037.2] Out: 2885 [Urine:2625; Emesis/NG output:150; Drains:60; Blood:50] This shift:  Total I/O In: 379.3 [I.V.:198.1; IV Piggyback:181.3] Out: 225 [Urine:125; Emesis/NG output:100]  Bowel function:  Flatus: No  BM:  No  Drain: Serosanguinous   Physical Exam:  General: Pt awake/alert in moderate acute distress.  Better than preop but seems quite exhausted Eyes: PERRL, normal EOM.  Sclera clear.  No icterus Neuro: CN II-XII intact w/o focal sensory/motor deficits. Lymph: No head/neck/groin lymphadenopathy Psych:  No delerium/psychosis/paranoia.  Oriented x 4 HENT: Normocephalic, Mucus membranes moist.  No thrush Neck: Supple, No tracheal deviation.  No obvious thyromegaly Chest: No pain to chest wall compression.  Good respiratory excursion.  No audible wheezing CV:  Pulses intact.  Regular rhythm.  No major extremity edema MS: Normal AROM mjr joints.  No obvious deformity  Abdomen: Somewhat firm.  Mildy distended.  Mildly tender at incisions only.  No evidence of peritonitis.  No incarcerated hernias.  Ext: Severe paresis bilateral upper extremities especially in hands with some chronic contracture.  Not spastic.  No mjr edema.  No cyanosis Skin: No petechiae / purpurea.  No major sores.  Warm and dry    Results:   Cultures: Recent Results (from the past 720 hour(s))  SARS Coronavirus 2 by RT PCR (hospital order, performed in [facility] hospital lab) Nasopharyngeal Nasopharyngeal Swab     Status: None   Collection Time: 03/01/20  5:33 PM   Specimen: Nasopharyngeal Swab  Result Value Ref Range Status   SARS Coronavirus 2 NEGATIVE NEGATIVE Final    Comment: (NOTE) SARS-CoV-2 target nucleic acids are NOT DETECTED. The SARS-CoV-2 RNA is generally detectable in upper and lower respiratory specimens during the acute phase of infection. The lowest concentration of SARS-CoV-2  viral copies this assay can detect is 250 copies / mL. A negative result does not preclude SARS-CoV-2 infection and should not be used as the sole basis for treatment or other patient management decisions.  A negative result may occur with improper specimen collection / handling, submission of specimen other than nasopharyngeal swab, presence of viral mutation(s) within the areas targeted by this assay, and inadequate number of viral copies (<250 copies / mL). A negative result must be combined with clinical observations, patient history, and epidemiological information. Fact Sheet for Patients:   StrictlyIdeas.no Fact Sheet for Healthcare Providers: BankingDealers.co.za This test is not yet approved or cleared  by the [place] FDA and has been authorized for detection and/or diagnosis of SARS-CoV-2 by FDA under an Emergency Use Authorization (EUA).  This EUA will remain in effect (meaning this test can be used) for the duration of the COVID-19 declaration under Section 564(b)(1) of the Act, 21 U.S.C. section 360bbb-3(b)(1), unless the authorization is terminated or revoked sooner. Performed at [facility], [place] [address] [place], [place] [address]   Blood culture (routine x 2)     Status: None (Preliminary result)   Collection Time: 03/30/20  1:48 PM   Specimen: BLOOD LEFT WRIST  Result Value Ref Range Status   Specimen Description BLOOD LEFT WRIST  Final   Special Requests  Final    BOTTLES DRAWN AEROBIC AND ANAEROBIC Blood Culture results may not be optimal due to an inadequate volume of blood received in culture bottles   Culture   Final    NO GROWTH < 24 HOURS Performed at [place] [address], [place], [place] [address]    Report Status PENDING  Incomplete  SARS Coronavirus 2 by RT PCR (hospital order, performed in [facility] hospital lab) Nasopharyngeal Nasopharyngeal Swab     Status:  None   Collection Time: 03/30/20  2:36 PM   Specimen: Nasopharyngeal Swab  Result Value Ref Range Status   SARS Coronavirus 2 NEGATIVE NEGATIVE Final    Comment: (NOTE) SARS-CoV-2 target nucleic acids are NOT DETECTED.  The SARS-CoV-2 RNA is generally detectable in upper and lower respiratory specimens during the acute phase of infection. The lowest concentration of SARS-CoV-2 viral copies this assay can detect is 250 copies / mL. A negative result does not preclude SARS-CoV-2 infection and should not be used as the sole basis for treatment or other patient management decisions.  A negative result may occur with improper specimen collection / handling, submission of specimen other than nasopharyngeal swab, presence of viral mutation(s) within the areas targeted by this assay, and inadequate number of viral copies (<250 copies / mL). A negative result must be combined with clinical observations, patient history, and epidemiological information.  Fact Sheet for Patients:   StrictlyIdeas.no  Fact Sheet for Healthcare Providers: BankingDealers.co.za  This test is not yet approved or  cleared by the [place] FDA and has been authorized for detection and/or diagnosis of SARS-CoV-2 by FDA under an Emergency Use Authorization (EUA).  This EUA will remain in effect (meaning this test can be used) for the duration of the COVID-19 declaration under Section 564(b)(1) of the Act, 21 U.S.C. section 360bbb-3(b)(1), unless the authorization is terminated or revoked sooner.  Performed at [facility], [place] [address], [place], [place] [address]   Surgical PCR screen     Status: None   Collection Time: 03/30/20  3:01 PM   Specimen: Nasal Mucosa; Nasal Swab  Result Value Ref Range Status   MRSA, PCR NEGATIVE NEGATIVE Final   Staphylococcus aureus NEGATIVE NEGATIVE Final    Comment: (NOTE) The Xpert SA Assay (FDA approved  for NASAL specimens in patients 49 years of age and older), is one component of a comprehensive surveillance program. It is not intended to diagnose infection nor to guide or monitor treatment. Performed at [facility], [place] [address], [place], [place] [address]     Labs: Results for orders placed or performed during the hospital encounter of 03/30/20 (from the past 48 hour(s))  CBC with Differential     Status: Abnormal   Collection Time: 03/30/20 11:09 AM  Result Value Ref Range   WBC 12.8 (H) 4.0 - 10.5 K/uL   RBC 4.26 3.87 - 5.11 MIL/uL   Hemoglobin 12.2 12.0 - 15.0 g/dL   HCT 39.4 36 - 46 %   MCV 92.5 80.0 - 100.0 fL   MCH 28.6 26.0 - 34.0 pg   MCHC 31.0 30.0 - 36.0 g/dL   RDW 16.3 (H) 11.5 - 15.5 %   Platelets 436 (H) 150 - 400 K/uL   nRBC 0.0 0.0 - 0.2 %   Neutrophils Relative % 88 %   Neutro Abs 11.3 (H) 1.7 - 7.7 K/uL   Lymphocytes Relative 9 %   Lymphs Abs 1.2 0.7 - 4.0  K/uL   Monocytes Relative 3 %   Monocytes Absolute 0.4 0 - 1 K/uL   Eosinophils Relative 0 %   Eosinophils Absolute 0.0 0 - 0 K/uL   Basophils Relative 0 %   Basophils Absolute 0.0 0 - 0 K/uL   Immature Granulocytes 0 %   Abs Immature Granulocytes 0.05 0.00 - 0.07 K/uL    Comment: Performed at [facility], [place] [address] [place], [place] [address]  Comprehensive metabolic panel     Status: Abnormal   Collection Time: 03/30/20 11:09 AM  Result Value Ref Range   Sodium 141 135 - 145 mmol/L   Potassium 4.4 3.5 - 5.1 mmol/L   Chloride 106 98 - 111 mmol/L   CO2 25 22 - 32 mmol/L   Glucose, Bld 124 (H) 70 - 99 mg/dL    Comment: Glucose reference range applies only to samples taken after fasting for at least 8 hours.   BUN 13 6 - 20 mg/dL   Creatinine, Ser 0.45 0.44 - 1.00 mg/dL   Calcium 8.8 (L) 8.9 - 10.3 mg/dL   Total Protein 6.4 (L) 6.5 - 8.1 g/dL   Albumin 3.5 3.5 - 5.0 g/dL   AST 17 15 - 41 U/L   ALT 12 0 - 44 U/L   Alkaline Phosphatase 75 38 - 126  U/L   Total Bilirubin 0.6 0.3 - 1.2 mg/dL   GFR calc non Af Amer >60 >60 mL/min   GFR calc Af Amer >60 >60 mL/min   Anion gap 10 5 - 15    Comment: Performed at [facility], [place] [address] [place], [place] [address]  Lipase, blood     Status: Abnormal   Collection Time: 03/30/20 11:09 AM  Result Value Ref Range   Lipase 113 (H) 11 - 51 U/L    Comment: Performed at [facility], [place] [address], [place], [place] [address]  Troponin I (High Sensitivity)     Status: None   Collection Time: 03/30/20 11:09 AM  Result Value Ref Range   Troponin I (High Sensitivity) <2 <18 ng/L    Comment: (NOTE) Elevated high sensitivity troponin I (hsTnI) values and significant  changes across serial measurements may suggest ACS but many other  chronic and acute conditions are known to elevate hsTnI results.  Refer to the "Links" section for chest pain algorithms and additional  guidance. Performed at [facility], [place] [address] [place], [place] [address]   I-Stat Beta hCG blood, ED ([facility], [facility], [facility] only)     Status: None   Collection Time: 03/30/20 11:16 AM  Result Value Ref Range   I-stat hCG, quantitative <5.0 <5 mIU/mL   Comment 3            Comment:   GEST. AGE      CONC.  (mIU/mL)   <=1 WEEK        5 - 50     2 WEEKS       50 - 500     3 WEEKS       100 - 10,000     4 WEEKS     1,000 - 30,000        FEMALE AND NON-PREGNANT FEMALE:     LESS THAN 5 mIU/mL   Lactic acid, plasma     Status: None   Collection Time: 03/30/20  1:48 PM  Result Value Ref Range   Lactic Acid, Venous 1.4 0.5 -  1.9 mmol/L    Comment: Performed at [facility], [place] [address], [place], [place] [address]  Blood culture (routine x 2)     Status: None (Preliminary result)   Collection Time: 03/30/20  1:48 PM   Specimen: BLOOD LEFT WRIST  Result Value Ref Range   Specimen Description BLOOD LEFT WRIST    Special Requests      BOTTLES DRAWN AEROBIC  AND ANAEROBIC Blood Culture results may not be optimal due to an inadequate volume of blood received in culture bottles   Culture      NO GROWTH < 24 HOURS Performed at [place] [address], [place], [place] [address]    Report Status PENDING   Troponin I (High Sensitivity)     Status: None   Collection Time: 03/30/20  1:48 PM  Result Value Ref Range   Troponin I (High Sensitivity) 2 <18 ng/L    Comment: (NOTE) Elevated high sensitivity troponin I (hsTnI) values and significant  changes across serial measurements may suggest ACS but many other  chronic and acute conditions are known to elevate hsTnI results.  Refer to the "Links" section for chest pain algorithms and additional  guidance. Performed at [facility], [place] [address] [place], [place] [address]   SARS Coronavirus 2 by RT PCR (hospital order, performed in [facility] hospital lab) Nasopharyngeal Nasopharyngeal Swab     Status: None   Collection Time: 03/30/20  2:36 PM   Specimen: Nasopharyngeal Swab  Result Value Ref Range   SARS Coronavirus 2 NEGATIVE NEGATIVE    Comment: (NOTE) SARS-CoV-2 target nucleic acids are NOT DETECTED.  The SARS-CoV-2 RNA is generally detectable in upper and lower respiratory specimens during the acute phase of infection. The lowest concentration of SARS-CoV-2 viral copies this assay can detect is 250 copies / mL. A negative result does not preclude SARS-CoV-2 infection and should not be used as the sole basis for treatment or other patient management decisions.  A negative result may occur with improper specimen collection / handling, submission of specimen other than nasopharyngeal swab, presence of viral mutation(s) within the areas targeted by this assay, and inadequate number of viral copies (<250 copies / mL). A negative result must be combined with clinical observations, patient history, and epidemiological information.  Fact Sheet for Patients:    StrictlyIdeas.no  Fact Sheet for Healthcare Providers: BankingDealers.co.za  This test is not yet approved or  cleared by the [place] FDA and has been authorized for detection and/or diagnosis of SARS-CoV-2 by FDA under an Emergency Use Authorization (EUA).  This EUA will remain in effect (meaning this test can be used) for the duration of the COVID-19 declaration under Section 564(b)(1) of the Act, 21 U.S.C. section 360bbb-3(b)(1), unless the authorization is terminated or revoked sooner.  Performed at [facility], [place] [address], [place], [place] [address]   Surgical PCR screen     Status: None   Collection Time: 03/30/20  3:01 PM   Specimen: Nasal Mucosa; Nasal Swab  Result Value Ref Range   MRSA, PCR NEGATIVE NEGATIVE   Staphylococcus aureus NEGATIVE NEGATIVE    Comment: (NOTE) The Xpert SA Assay (FDA approved for NASAL specimens in patients 71 years of age and older), is one component of a comprehensive surveillance program. It is not intended to diagnose infection nor to guide or monitor treatment. Performed at [facility], [place] [address], [place], [place] [address]   Type  and screen [place]     Status: None   Collection Time: 03/30/20  5:26 PM  Result Value Ref Range   ABO/RH(D) O NEG    Antibody Screen NEG    Sample Expiration      04/02/2020,2359 Performed at [facility], [place] [address] [place], [place] [address]   ABO/Rh     Status: None (Preliminary result)   Collection Time: 03/30/20  5:26 PM  Result Value Ref Range   ABO/RH(D)      Jenetta Downer NEG Performed at [facility], [place] [address], [place], [place] [address]   Glucose, capillary     Status: Abnormal   Collection Time: 03/30/20 11:19 PM  Result Value Ref Range   Glucose-Capillary 125 (H) 70 - 99 mg/dL    Comment: Glucose reference range applies only to  samples taken after fasting for at least 8 hours.   Comment 1 Notify RN    Comment 2 Document in Chart   CBC with Differential/Platelet     Status: Abnormal   Collection Time: 03/31/20  2:27 AM  Result Value Ref Range   WBC 12.0 (H) 4.0 - 10.5 K/uL   RBC 3.67 (L) 3.87 - 5.11 MIL/uL   Hemoglobin 10.5 (L) 12.0 - 15.0 g/dL   HCT 32.8 (L) 36 - 46 %   MCV 89.4 80.0 - 100.0 fL   MCH 28.6 26.0 - 34.0 pg   MCHC 32.0 30.0 - 36.0 g/dL   RDW 15.9 (H) 11.5 - 15.5 %   Platelets 392 150 - 400 K/uL   nRBC 0.0 0.0 - 0.2 %   Neutrophils Relative % 92 %   Neutro Abs 11.0 (H) 1.7 - 7.7 K/uL   Lymphocytes Relative 4 %   Lymphs Abs 0.5 (L) 0.7 - 4.0 K/uL   Monocytes Relative 3 %   Monocytes Absolute 0.4 0 - 1 K/uL   Eosinophils Relative 0 %   Eosinophils Absolute 0.0 0 - 0 K/uL   Basophils Relative 0 %   Basophils Absolute 0.0 0 - 0 K/uL   WBC Morphology MILD LEFT SHIFT (1-5% METAS, OCC MYELO, OCC BANDS)    Immature Granulocytes 1 %   Abs Immature Granulocytes 0.06 0.00 - 0.07 K/uL    Comment: Performed at [facility], [place] [address] [place], [place] [address]  Prealbumin     Status: Abnormal   Collection Time: 03/31/20  6:47 AM  Result Value Ref Range   Prealbumin 17.7 (L) 18 - 38 mg/dL    Comment: Performed at [facility], [place] [address], [place], [place] [address]  Lactic acid, plasma     Status: None   Collection Time: 03/31/20  6:47 AM  Result Value Ref Range   Lactic Acid, Venous 1.3 0.5 - 1.9 mmol/L    Comment: Performed at [facility], [place] [address], [place],  [address]  Comprehensive metabolic panel     Status: Abnormal   Collection Time: 03/31/20  6:47 AM  Result Value Ref Range   Sodium 138 135 - 145 mmol/L   Potassium 4.1 3.5 - 5.1 mmol/L   Chloride 106 98 - 111 mmol/L   CO2 20 (L) 22 - 32 mmol/L   Glucose, Bld 108 (H) 70 - 99 mg/dL    Comment: Glucose reference range applies only to samples taken after  fasting for at least 8 hours.   BUN 7 6 - 20 mg/dL   Creatinine,  Ser 0.37 (L) 0.44 - 1.00 mg/dL   Calcium 8.4 (L) 8.9 - 10.3 mg/dL   Total Protein 6.1 (L) 6.5 - 8.1 g/dL   Albumin 3.5 3.5 - 5.0 g/dL   AST 19 15 - 41 U/L   ALT 14 0 - 44 U/L   Alkaline Phosphatase 46 38 - 126 U/L   Total Bilirubin 0.6 0.3 - 1.2 mg/dL   GFR calc non Af Amer >60 >60 mL/min   GFR calc Af Amer >60 >60 mL/min   Anion gap 12 5 - 15    Comment: Performed at [facility], [place] [address], [place], [place] [address]  Magnesium     Status: None   Collection Time: 03/31/20  6:47 AM  Result Value Ref Range   Magnesium 1.7 1.7 - 2.4 mg/dL    Comment: Performed at [facility], [place] [address], [place], [place] [address]  Phosphorus     Status: None   Collection Time: 03/31/20  6:47 AM  Result Value Ref Range   Phosphorus 2.5 2.5 - 4.6 mg/dL    Comment: Performed at [facility], [place] [address] [place], [place] [address]    Imaging / Studies: DG Abdomen Acute W/Chest  Result Date: 03/30/2020 CLINICAL DATA:  Epigastric pain radiating to right side. History of ALS. EXAM: DG ABDOMEN ACUTE W/ 1V CHEST COMPARISON:  Chest x-ray 09/05/2019 and abdominal films 03/03/2020 FINDINGS: Patient is slightly rotated to the right. Lungs are hypoinflated but otherwise clear. Cardiomediastinal silhouette and remainder of the chest is unremarkable. Abdominopelvic images demonstrate several air-filled dilated central small bowel loops measuring up to 4.2 cm in diameter. Mild amount of air and stool throughout the colon. Mild gastric distension. No free peritoneal air. Remainder the exam is unchanged. IMPRESSION: 1. Several dilated air-filled central small bowel loops with mild air throughout the colon. Findings may be seen and ileus versus early/partial small bowel obstruction. 2.  Hypoinflation without acute cardiopulmonary disease. Electronically Signed   By: Marin Olp M.D.   On:  03/30/2020 12:52   CT Angio Chest/Abd/Pel for Dissection W and/or Wo Contrast  Result Date: 03/30/2020 CLINICAL DATA:  Epigastric pain radiating into right abdomen and back. History of ALS. EXAM: CT ANGIOGRAPHY CHEST, ABDOMEN AND PELVIS TECHNIQUE: Non-contrast CT of the chest was initially obtained. Multidetector CT imaging through the chest, abdomen and pelvis was performed using the standard protocol during bolus administration of intravenous contrast. Multiplanar reconstructed images and MIPs were obtained and reviewed to evaluate the vascular anatomy. CONTRAST:  169mL OMNIPAQUE IOHEXOL 350 MG/ML SOLN COMPARISON:  CT of the abdomen and pelvis on 03/01/2020 FINDINGS: CTA CHEST FINDINGS Cardiovascular: The thoracic aorta is normal in caliber and demonstrates no evidence of dissection. There is calcified plaque at the level of the aortic arch. Visualized proximal great vessels are normally patent. Pulmonary arteries are also well opacified and demonstrate normal patency and normal caliber. The heart size is normal. No pericardial fluid identified. No significant calcified coronary artery plaque identified. Mediastinum/Nodes: No enlarged mediastinal, hilar, or axillary lymph nodes. Thyroid gland, trachea, and esophagus demonstrate no significant findings. Lungs/Pleura: Bibasilar atelectasis present. There is a trace amount pleural fluid bilaterally, right greater than left. There is no evidence of pulmonary edema, focal airspace consolidation, pneumothorax, nodule or pleural fluid. Musculoskeletal: No chest wall abnormality. No acute or significant osseous findings. Review of the MIP images confirms the above findings. CTA ABDOMEN AND PELVIS FINDINGS VASCULAR Aorta: Calcified plaque in the abdominal aorta  without evidence of aneurysmal disease or dissection. Celiac: Normally patent.  Normal branch vessel patency. SMA: Normally patent. Renals: Two separate left and two separate right renal arteries demonstrate  normal patency. The lower pole artery on the right emanates off of the distal aorta nearly at the aortic bifurcation. IMA: Normally patent. Inflow: Normally patent bilateral iliac arteries and common femoral arteries. Review of the MIP images confirms the above findings. NON-VASCULAR Hepatobiliary: No focal liver abnormality is seen. No gallstones, gallbladder wall thickening, or biliary dilatation. Pancreas: Unremarkable. No pancreatic ductal dilatation or surrounding inflammatory changes. Spleen: Normal in size without focal abnormality. Adrenals/Urinary Tract: Adrenal glands are unremarkable. Kidneys are normal, without renal calculi, focal lesion, or hydronephrosis. Bladder is unremarkable. Stomach/Bowel: Moderate free intraperitoneal air is present in the peritoneal cavity. The stomach is distended with air and there is some potential inflammation and thickening of the proximal duodenum. At the level of the mid and lower abdomen, there are multiple dilated small bowel loops reaching maximal caliber of approximately 4 cm. The dilated loops are inflamed in appearance and hyperemic. One transversely oriented mid small bowel loop appears thickened and there is a layer of free intraperitoneal air anterior to this bowel loop. Lymphatic: No enlarged lymph nodes identified. Reproductive: Status post hysterectomy. No adnexal masses. Other: Small amount of free fluid in the pelvis and interspersed between some lower dilated small bowel loops. No focal abscess. Musculoskeletal: No acute or significant osseous findings. Review of the MIP images confirms the above findings. IMPRESSION: 1. No evidence of aortic dissection. 2. Moderate free intraperitoneal air in the peritoneal cavity. At the level of the mid and lower abdomen, there are multiple dilated small bowel loops reaching maximal caliber of approximately 4 cm. The dilated loops are inflamed in appearance and hyperemic. One of the mid small bowel loops appears  decompressed and thickened and there is a layer of free intraperitoneal air anterior to this bowel loop. Findings are consistent with a partial small bowel obstruction/ileus and small bowel perforation may be the source of free air. 3. Possible inflammation and thickening of the proximal duodenum. Perforated duodenal ulcer also cannot be excluded as a potential source for free intraperitoneal air. 4. Trace amount of pleural fluid bilaterally, right greater than left. 5. Small amount of free fluid in the abdomen and pelvis. These results were called by telephone at the time of interpretation on 03/30/2020 at 1:25 PM to provider [facility] , who verbally acknowledged these results. Electronically Signed   By: Aletta Edouard M.D.   On: 03/30/2020 13:34    Medications / Allergies: per chart  Antibiotics: Anti-infectives (From admission, onward)   Start     Dose/Rate Route Frequency Ordered Stop   03/31/20 0800  piperacillin-tazobactam (ZOSYN) IVPB 3.375 g     Discontinue     3.375 g 12.5 mL/hr over 240 Minutes Intravenous Every 8 hours 03/31/20 0758 04/05/20 0559   03/30/20 1751  clindamycin (CLEOCIN) 900 mg, gentamicin (GARAMYCIN) 240 mg in sodium chloride 0.9 % 1,000 mL for intraperitoneal lavage  Status:  Discontinued          As needed 03/30/20 1752 03/30/20 1936   03/30/20 1445  clindamycin (CLEOCIN) IVPB 900 mg  Status:  Discontinued       "And" Linked Group Details   900 mg 100 mL/hr over 30 Minutes Intravenous On call to O.R. 03/30/20 1427 03/31/20 0559   03/30/20 1445  gentamicin (GARAMYCIN) 270 mg in dextrose 5 % 100 mL IVPB  Status:  Discontinued       "And" Linked Group Details   5 mg/kg  53.5 kg 106.8 mL/hr over 60 Minutes Intravenous On call to O.R. 03/30/20 1427 03/31/20 0559   03/30/20 1430  clindamycin (CLEOCIN) 900 mg, gentamicin (GARAMYCIN) 240 mg in sodium chloride 0.9 % 1,000 mL for intraperitoneal lavage  Status:  Discontinued       Note to Pharmacy: Have in the  [facility] room  for final irrigation in bowel surgery case to minimize risk of abscess/infection Pharmacy may adjust dosing strength, schedule, rate of infusion, etc as needed to optimize therapy    Irrigation To Surgery 03/30/20 1427 03/31/20 0758   03/30/20 1330  piperacillin-tazobactam (ZOSYN) IVPB 3.375 g        3.375 g 12.5 mL/hr over 240 Minutes Intravenous  Once 03/30/20 1324 03/30/20 1623        Note: Portions of this report may have been transcribed using voice recognition software. Every effort was made to ensure accuracy; however, inadvertent computerized transcription errors may be present.   Any transcriptional errors that result from this process are unintentional.    Adin Hector, MD, FACS, MASCRS Gastrointestinal and Minimally Invasive Surgery  [facility] [address] [place], [place] [address] [phone] Fax [phone] Main/Paging  CONTACT INFORMATION: Weekday (9AM-5PM) concerns: Call [facility] main office at [phone] Weeknight (5PM-9AM) or Weekend/Holiday concerns: Check [email or web address] for General Surgery [facility] coverage (Please, do not use SecureChat as it is not reliable communication to operating surgeons for immediate patient care)      03/31/2020  7:59 AM

## 2020-03-31 NOTE — Progress Notes (Addendum)
PROGRESS NOTE  ELLASYN SWILLING  OJJ:[id number] DOB: [birth year] DOA: 03/30/2020 PCP: Shirline Frees, MD   Brief Narrative: Lori Alvarez is a 59 y.o. female with a history of ALS, hysterectomy, appendectomy, recurrent SBO who presented to the ED with lower chest, upper abdominal pain. No significant cardiac or pulmonary etiologies found, though CT with free air with gas and duodenal bulb suspicious for perforation, dilated small bowel with probable obstruction. She underwent emergency surgery with OR findings of massive pneumoperitoneum, duodenal perforation at bulb, purulent peritonitis, dilated small bowel with stricture, possible source of chronic obstruction. This was treated with omental Phillip Heal patch and ileal resection with side-to-side anastomosis. Weaning off vent was surprisingly uncomplicated and she remains in ICU with palliative care consulting, still with NG tube.   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  - PPI gtt x72 hrs > BID thereafter. - Zosyn. Blood cultures NGTD. - EGD recommended in 6-8 weeks to follow for resolution - Continuing NG tube for internal drainage/decompression and Blake drain for external drainage.  - Will check amylase from drain output 6/14  to r/o persistent leak. Once advanced diet and no evidence of leak, can remove Blake drain prior to discharge.  - Per surgery - Appreciate palliative care assistance  with pain control including scheduled tylenol, prn morphine with antiemetics and robaxin.   Recurrent/chronic SBO, ileal stricture: s/p ileal resection and side-to-side anastomosis.   - Continue NG decompression until ileus resolved, then clamping trial vs. UGI series.  - Per surgery  ALS: Dx August 2017, first had left hand weakness, followed by ALS clinic, Dr. Brynda Rim. - Continue as much PT/OT as reasonably possible to avoid deconditioning, help with ileus. - Robaxin IV for po baclofen, restart lyrica, duloxetine, and hyoscyamine when able to take po  Anxiety:  - Ativan prn as sub for xanax home med  Vitamin B12 deficiency:  - Restart supplementation once taking po   DVT prophylaxis: SCDs per surgery Code Status: Full Family Communication: None at bedside at time of encounter Disposition Plan:  Status is: Inpatient  Remains inpatient appropriate because:Ongoing active pain requiring inpatient pain management and IV treatments appropriate due to intensity of illness or inability to take PO   Dispo: The patient is from: Home              Anticipated d/c is to: TBD              Anticipated d/c date is: > 3 days              Patient currently is not medically stable to d/c.  Consultants:   General surgery  Palliative care medicine team  Procedures:   03/30/2020 Dr. Johney Maine: Ex lap, omental patch and ileal resection  Antimicrobials:  Zosyn 6/12 >>    Subjective: Very uncomfortable, needing assistance moving her hands and neck/head to different positions. Abdomen is tender but improved with pain medications though these cause nausea incompletely improved with antiemetics.  Objective:  Vitals:   03/31/20 1100 03/31/20 1200 03/31/20 1300 03/31/20 1400  BP:  (!) 116/45  (!) 115/46  Pulse: (!) 109 (!) 107 (!) 113 (!) 109  Resp: 17 17 20  (!) 26  Temp:      TempSrc:      SpO2: 98% 98% 99% 98%  Weight:      Height:        Intake/Output Summary (Last 24 hours) at 03/31/2020  1636 Last data filed at 03/31/2020 1322 Gross per 24 hour  Intake 3939.02 ml  Output 3785 ml  Net 154.02 ml   Filed Weights   03/30/20 1019  Weight: 53.5 kg    Gen: 59 y.o. female in evident discomfort Pulm: Non-labored breathing room air. Clear to auscultation bilaterally.  CV: Regular rate and rhythm. No murmur, rub, or gallop. No JVD, no pedal edema. GI: Abdomen soft, appropriately tender without taut distention, +BS.   Ext: Warm, no deformities, increased muscle tone, decreased bulk in UE's Skin: No rashes, lesions or ulcers Neuro: Alert and oriented. Apraxia, decreased strength in UE's. No new focal neurological deficits. Psych: Judgement and insight appear normal. Mood & affect appropriate.   Data Reviewed: I have personally reviewed following labs and imaging studies  CBC: Recent Labs  Lab 03/30/20 1109 03/31/20 0227  WBC 12.8* 12.0*  NEUTROABS 11.3* 11.0*  HGB 12.2 10.5*  HCT 39.4 32.8*  MCV 92.5 89.4  PLT 436* 517   Basic Metabolic Panel: Recent Labs  Lab 03/30/20 1109 03/31/20 0647  NA 141 138  K 4.4 4.1  CL 106 106  CO2 25 20*  GLUCOSE 124* 108*  BUN 13 7  CREATININE 0.45 0.37*  CALCIUM 8.8* 8.4*  MG  --  1.7  PHOS  --  2.5   GFR: Estimated Creatinine Clearance: 61.9 mL/min (A) (by C-G formula based on SCr of 0.37 mg/dL (L)). Liver Function Tests: Recent Labs  Lab 03/30/20 1109 03/31/20 0647  AST 17 19  ALT 12 14  ALKPHOS 75 46  BILITOT 0.6 0.6  PROT 6.4* 6.1*  ALBUMIN 3.5 3.5   Recent Labs  Lab 03/30/20 1109  LIPASE 113*   No results for input(s): AMMONIA in the last 168 hours. Coagulation Profile: No results for input(s): INR, PROTIME in the last 168 hours. Cardiac Enzymes: No results for input(s): CKTOTAL, CKMB, CKMBINDEX, TROPONINI in the last 168 hours. BNP (last 3 results) No results for input(s): PROBNP in the last 8760 hours. HbA1C: No results for input(s): HGBA1C in the last 72 hours. CBG: Recent Labs  Lab  03/30/20 2319  GLUCAP 125*   Lipid Profile: No results for input(s): CHOL, HDL, LDLCALC, TRIG, CHOLHDL, LDLDIRECT in the last 72 hours. Thyroid Function Tests: No results for input(s): TSH, T4TOTAL, FREET4, T3FREE, THYROIDAB in the last 72 hours. Anemia Panel: No results for input(s): VITAMINB12, FOLATE, FERRITIN, TIBC, IRON, RETICCTPCT in the last 72 hours. Urine analysis:    Component Value Date/Time   COLORURINE STRAW (A) 03/01/2020 1619   APPEARANCEUR CLEAR 03/01/2020 1619   LABSPEC 1.004 (L) 03/01/2020 1619   PHURINE 6.0 03/01/2020 1619   GLUCOSEU NEGATIVE 03/01/2020 1619   HGBUR NEGATIVE 03/01/2020 1619   BILIRUBINUR NEGATIVE 03/01/2020 1619   KETONESUR 20 (A) 03/01/2020 1619   PROTEINUR NEGATIVE 03/01/2020 1619   NITRITE NEGATIVE 03/01/2020 1619   LEUKOCYTESUR NEGATIVE 03/01/2020 1619   Recent Results (from the past 240 hour(s))  Blood culture (routine x 2)     Status: None (Preliminary result)   Collection  Time: 03/30/20  1:48 PM   Specimen: BLOOD LEFT WRIST  Result Value Ref Range Status   Specimen Description BLOOD LEFT WRIST  Final   Special Requests   Final    BOTTLES DRAWN AEROBIC AND ANAEROBIC Blood Culture results may not be optimal due to an inadequate volume of blood received in culture bottles   Culture   Final    NO GROWTH < 24 HOURS Performed at [facility], [place], [place], [place] [address]    Report Status PENDING  Incomplete  SARS Coronavirus 2 by RT PCR (hospital order, performed in [facility] hospital lab) Nasopharyngeal Nasopharyngeal Swab     Status: None   Collection Time: 03/30/20  2:36 PM   Specimen: Nasopharyngeal Swab  Result Value Ref Range Status   SARS Coronavirus 2 NEGATIVE NEGATIVE Final    Comment: (NOTE) SARS-CoV-2 target nucleic acids are NOT DETECTED.  The SARS-CoV-2 RNA is generally detectable in upper and lower respiratory specimens during the acute phase of infection. The lowest concentration of SARS-CoV-2  viral copies this assay can detect is 250 copies / mL. A negative result does not preclude SARS-CoV-2 infection and should not be used as the sole basis for treatment or other patient management decisions.  A negative result may occur with improper specimen collection / handling, submission of specimen other than nasopharyngeal swab, presence of viral mutation(s) within the areas targeted by this assay, and inadequate number of viral copies (<250 copies / mL). A negative result must be combined with clinical observations, patient history, and epidemiological information.  Fact Sheet for Patients:   StrictlyIdeas.no  Fact Sheet for Healthcare Providers: BankingDealers.co.za  This test is not yet approved or  cleared by the [place] FDA and has been authorized for detection and/or diagnosis of SARS-CoV-2 by FDA under an Emergency Use Authorization (EUA).  This EUA will remain in effect (meaning this test can be used) for the duration of the COVID-19 declaration under Section 564(b)(1) of the Act, 21 U.S.C. section 360bbb-3(b)(1), unless the authorization is terminated or revoked sooner.  Performed at [facility], [place] [address], [place], [place] [address]   Surgical PCR screen     Status: None   Collection Time: 03/30/20  3:01 PM   Specimen: Nasal Mucosa; Nasal Swab  Result Value Ref Range Status   MRSA, PCR NEGATIVE NEGATIVE Final   Staphylococcus aureus NEGATIVE NEGATIVE Final    Comment: (NOTE) The Xpert SA Assay (FDA approved for NASAL specimens in patients 74 years of age and older), is one component of a comprehensive surveillance program. It is not intended to diagnose infection nor to guide or monitor treatment. Performed at [facility], [place] [address] [place], [place] [address]       Radiology Studies: DG Abdomen Acute W/Chest  Result Date: 03/30/2020 CLINICAL DATA:   Epigastric pain radiating to right side. History of ALS. EXAM: DG ABDOMEN ACUTE W/ 1V CHEST COMPARISON:  Chest x-ray 09/05/2019 and abdominal films 03/03/2020 FINDINGS: Patient is slightly rotated to the right. Lungs are hypoinflated but otherwise clear. Cardiomediastinal silhouette and remainder of the chest is unremarkable. Abdominopelvic images demonstrate several air-filled dilated central small bowel loops measuring up to 4.2 cm in diameter. Mild amount of air and stool throughout the colon. Mild gastric distension. No free peritoneal air. Remainder the exam is unchanged. IMPRESSION: 1. Several dilated air-filled central small bowel loops with mild air throughout the colon. Findings may be seen and ileus  versus early/partial small bowel obstruction. 2.  Hypoinflation without acute cardiopulmonary disease. Electronically Signed   By: Marin Olp M.D.   On: 03/30/2020 12:52   CT Angio Chest/Abd/Pel for Dissection W and/or Wo Contrast  Result Date: 03/30/2020 CLINICAL DATA:  Epigastric pain radiating into right abdomen and back. History of ALS. EXAM: CT ANGIOGRAPHY CHEST, ABDOMEN AND PELVIS TECHNIQUE: Non-contrast CT of the chest was initially obtained. Multidetector CT imaging through the chest, abdomen and pelvis was performed using the standard protocol during bolus administration of intravenous contrast. Multiplanar reconstructed images and MIPs were obtained and reviewed to evaluate the vascular anatomy. CONTRAST:  180mL OMNIPAQUE IOHEXOL 350 MG/ML SOLN COMPARISON:  CT of the abdomen and pelvis on 03/01/2020 FINDINGS: CTA CHEST FINDINGS Cardiovascular: The thoracic aorta is normal in caliber and demonstrates no evidence of dissection. There is calcified plaque at the level of the aortic arch. Visualized proximal great vessels are normally patent. Pulmonary arteries are also well opacified and demonstrate normal patency and normal caliber. The heart size is normal. No pericardial fluid identified. No  significant calcified coronary artery plaque identified. Mediastinum/Nodes: No enlarged mediastinal, hilar, or axillary lymph nodes. Thyroid gland, trachea, and esophagus demonstrate no significant findings. Lungs/Pleura: Bibasilar atelectasis present. There is a trace amount pleural fluid bilaterally, right greater than left. There is no evidence of pulmonary edema, focal airspace consolidation, pneumothorax, nodule or pleural fluid. Musculoskeletal: No chest wall abnormality. No acute or significant osseous findings. Review of the MIP images confirms the above findings. CTA ABDOMEN AND PELVIS FINDINGS VASCULAR Aorta: Calcified plaque in the abdominal aorta without evidence of aneurysmal disease or dissection. Celiac: Normally patent.  Normal branch vessel patency. SMA: Normally patent. Renals: Two separate left and two separate right renal arteries demonstrate normal patency. The lower pole artery on the right emanates off of the distal aorta nearly at the aortic bifurcation. IMA: Normally patent. Inflow: Normally patent bilateral iliac arteries and common femoral arteries. Review of the MIP images confirms the above findings. NON-VASCULAR Hepatobiliary: No focal liver abnormality is seen. No gallstones, gallbladder wall thickening, or biliary dilatation. Pancreas: Unremarkable. No pancreatic ductal dilatation or surrounding inflammatory changes. Spleen: Normal in size without focal abnormality. Adrenals/Urinary Tract: Adrenal glands are unremarkable. Kidneys are normal, without renal calculi, focal lesion, or hydronephrosis. Bladder is unremarkable. Stomach/Bowel: Moderate free intraperitoneal air is present in the peritoneal cavity. The stomach is distended with air and there is some potential inflammation and thickening of the proximal duodenum. At the level of the mid and lower abdomen, there are multiple dilated small bowel loops reaching maximal caliber of approximately 4 cm. The dilated loops are inflamed  in appearance and hyperemic. One transversely oriented mid small bowel loop appears thickened and there is a layer of free intraperitoneal air anterior to this bowel loop. Lymphatic: No enlarged lymph nodes identified. Reproductive: Status post hysterectomy. No adnexal masses. Other: Small amount of free fluid in the pelvis and interspersed between some lower dilated small bowel loops. No focal abscess. Musculoskeletal: No acute or significant osseous findings. Review of the MIP images confirms the above findings. IMPRESSION: 1. No evidence of aortic dissection. 2. Moderate free intraperitoneal air in the peritoneal cavity. At the level of the mid and lower abdomen, there are multiple dilated small bowel loops reaching maximal caliber of approximately 4 cm. The dilated loops are inflamed in appearance and hyperemic. One of the mid small bowel loops appears decompressed and thickened and there is a layer of free intraperitoneal air anterior to  this bowel loop. Findings are consistent with a partial small bowel obstruction/ileus and small bowel perforation may be the source of free air. 3. Possible inflammation and thickening of the proximal duodenum. Perforated duodenal ulcer also cannot be excluded as a potential source for free intraperitoneal air. 4. Trace amount of pleural fluid bilaterally, right greater than left. 5. Small amount of free fluid in the abdomen and pelvis. These results were called by telephone at the time of interpretation on 03/30/2020 at 1:25 PM to provider [facility] , who verbally acknowledged these results. Electronically Signed   By: Aletta Edouard M.D.   On: 03/30/2020 13:34    Scheduled Meds: . sodium chloride   Intravenous Once  . Chlorhexidine Gluconate Cloth  6 each Topical Daily  . lip balm  1 application Topical BID  . mouth rinse  15 mL Mouth Rinse BID  . mupirocin ointment  1 application Nasal BID  . ondansetron (ZOFRAN) IV  4 mg Intravenous Q8H  . [START ON  04/03/2020] pantoprazole  40 mg Intravenous Q12H   Continuous Infusions: . acetaminophen Stopped (03/31/20 1129)  . lactated ringers    . lactated ringers 75 mL/hr at 03/31/20 0917  . methocarbamol (ROBAXIN) IV Stopped (03/31/20 1330)  . pantoprozole (PROTONIX) infusion 8 mg/hr (03/31/20 1116)  . piperacillin-tazobactam (ZOSYN)  IV Stopped (03/31/20 1325)     LOS: 1 day   Time spent: 25 minutes.  Patrecia Pour, MD [facility] [email or web address] 03/31/2020, 4:36 PM

## 2020-03-31 NOTE — Progress Notes (Signed)
Patient having complaints of nausea refractory to Zofran. MD paged and Phenergan order received and administered. Patient feels nausea is from narcotics and NGT in throat. Patient also not able to tolerate hourly blood pressure checks or SCDs due to discomfort.

## 2020-03-31 NOTE — Progress Notes (Signed)
OT Cancellation Note  Patient Details Name: Lori Alvarez MRN: [id number] DOB: 01/27/1961   Cancelled Treatment:    Reason Eval/Treat Not Completed: Medical issues which prohibited therapy. To room to see patient for evaluation. RN in room with patient and had just given her pain meds and 2 anti-nausea meds. Pt not feeling up to therapy at this time. Will re-attempt eval as schedule allows.  [place], OTR/L Acute Rehab Services Pager [phone] Office [phone]     Almon Register 03/31/2020, 10:11 AM

## 2020-03-31 NOTE — Progress Notes (Signed)
PT Cancellation Note  Patient Details Name: Lori Alvarez MRN: [id number] DOB: 12-04-1960   Cancelled Treatment:    Reason Eval/Treat Not Completed: Medical issues which prohibited therapy.  To room to see patient for evaluation. RN in room with patient and had just given her pain meds and 2 anti-nausea meds. Pt not feeling up to therapy at this time. Will re-attempt eval as schedule allows.   [facility] 03/31/2020, 11:30 AM

## 2020-03-31 NOTE — Progress Notes (Signed)
Pt was lying in bed awake and alert when I arrived. She said she had surgery yesterday and was not feeling to well today. She said she and her husband have been married 62 years. He was very attentive and supportive. She also told me about her ALS diagnosis. Pt said she did not want to become tearful or upset because it is difficult for her because of her ALS. She said she does not use of her hands but she can still walk. Pt was extremely pleasant. She said she and her husband are believers and have deep faith.  I provided active listening, presence, and spiritual support with prayer. They were very appreciative of spiritual care support. Please page if additional support is needed. [place], [place]   03/31/20 1300  Clinical Encounter Type  Visited With Patient and family together

## 2020-04-01 ENCOUNTER — Ambulatory Visit: Payer: Medicare Other | Admitting: Physical Therapy

## 2020-04-01 DIAGNOSIS — R531 Weakness: Secondary | ICD-10-CM

## 2020-04-01 DIAGNOSIS — K56699 Other intestinal obstruction unspecified as to partial versus complete obstruction: Secondary | ICD-10-CM

## 2020-04-01 DIAGNOSIS — Z515 Encounter for palliative care: Secondary | ICD-10-CM

## 2020-04-01 DIAGNOSIS — Z7189 Other specified counseling: Secondary | ICD-10-CM

## 2020-04-01 LAB — COMPREHENSIVE METABOLIC PANEL
ALT: 13 U/L (ref 0–44)
AST: 22 U/L (ref 15–41)
Albumin: 3.5 g/dL (ref 3.5–5.0)
Alkaline Phosphatase: 61 U/L (ref 38–126)
Anion gap: 15 (ref 5–15)
BUN: 9 mg/dL (ref 6–20)
CO2: 20 mmol/L — ABNORMAL LOW (ref 22–32)
Calcium: 9.2 mg/dL (ref 8.9–10.3)
Chloride: 106 mmol/L (ref 98–111)
Creatinine, Ser: 0.45 mg/dL (ref 0.44–1.00)
GFR calc Af Amer: >60 mL/min (ref >60)
GFR calc non Af Amer: >60 mL/min (ref >60)
Glucose, Bld: 75 mg/dL (ref 70–99)
Potassium: 3.7 mmol/L (ref 3.5–5.1)
Sodium: 141 mmol/L (ref 135–145)
Total Bilirubin: 1.4 mg/dL — ABNORMAL HIGH (ref 0.3–1.2)
Total Protein: 6.4 g/dL — ABNORMAL LOW (ref 6.5–8.1)

## 2020-04-01 LAB — CBC WITH DIFFERENTIAL/PLATELET
Abs Immature Granulocytes: 0.05 10*3/uL (ref 0.00–0.07)
Basophils Absolute: 0 10*3/uL (ref 0.0–0.1)
Basophils Relative: 0 %
Eosinophils Absolute: 0 10*3/uL (ref 0.0–0.5)
Eosinophils Relative: 0 %
HCT: 33.3 % — ABNORMAL LOW (ref 36.0–46.0)
Hemoglobin: 10.5 g/dL — ABNORMAL LOW (ref 12.0–15.0)
Immature Granulocytes: 0 %
Lymphocytes Relative: 9 %
Lymphs Abs: 1.1 10*3/uL (ref 0.7–4.0)
MCH: 29 pg (ref 26.0–34.0)
MCHC: 31.5 g/dL (ref 30.0–36.0)
MCV: 92 fL (ref 80.0–100.0)
Monocytes Absolute: 0.5 10*3/uL (ref 0.1–1.0)
Monocytes Relative: 4 %
Neutro Abs: 11.5 10*3/uL — ABNORMAL HIGH (ref 1.7–7.7)
Neutrophils Relative %: 87 %
Platelets: 414 10*3/uL — ABNORMAL HIGH (ref 150–400)
RBC: 3.62 MIL/uL — ABNORMAL LOW (ref 3.87–5.11)
RDW: 16.5 % — ABNORMAL HIGH (ref 11.5–15.5)
WBC: 13.2 10*3/uL — ABNORMAL HIGH (ref 4.0–10.5)
nRBC: 0 % (ref 0.0–0.2)

## 2020-04-01 LAB — PHOSPHORUS: Phosphorus: 1.4 mg/dL — ABNORMAL LOW (ref 2.5–4.6)

## 2020-04-01 LAB — GLUCOSE, CAPILLARY
Glucose-Capillary: 118 mg/dL — ABNORMAL HIGH (ref 70–99)
Glucose-Capillary: 121 mg/dL — ABNORMAL HIGH (ref 70–99)
Glucose-Capillary: 64 mg/dL — ABNORMAL LOW (ref 70–99)
Glucose-Capillary: 88 mg/dL (ref 70–99)

## 2020-04-01 LAB — MAGNESIUM: Magnesium: 2.1 mg/dL (ref 1.7–2.4)

## 2020-04-01 LAB — AMYLASE: Amylase: 57 U/L (ref 28–100)

## 2020-04-01 MED ORDER — LIVING WELL WITH DIABETES BOOK
Freq: Once | Status: DC
  Filled 2020-04-01: qty 1

## 2020-04-01 MED ORDER — SODIUM PHOSPHATES 45 MMOLE/15ML IV SOLN
30.0000 mmol | Freq: Once | INTRAVENOUS | Status: AC
  Administered 2020-04-01: 30 mmol via INTRAVENOUS
  Filled 2020-04-01: qty 10

## 2020-04-01 MED ORDER — ACETAMINOPHEN 10 MG/ML IV SOLN
1000.0000 mg | Freq: Four times a day (QID) | INTRAVENOUS | Status: DC
  Administered 2020-04-01: 1000 mg via INTRAVENOUS
  Filled 2020-04-01: qty 100

## 2020-04-01 MED ORDER — METOPROLOL TARTRATE 5 MG/5ML IV SOLN
5.0000 mg | Freq: Three times a day (TID) | INTRAVENOUS | Status: DC
  Administered 2020-04-01: 5 mg via INTRAVENOUS
  Filled 2020-04-01: qty 5

## 2020-04-01 MED ORDER — PIPERACILLIN-TAZOBACTAM 3.375 G IVPB 30 MIN
3.3750 g | Freq: Once | INTRAVENOUS | Status: AC
  Administered 2020-04-01: 3.375 g via INTRAVENOUS
  Filled 2020-04-01: qty 50

## 2020-04-01 MED ORDER — PANTOPRAZOLE SODIUM 40 MG IV SOLR
40.0000 mg | Freq: Two times a day (BID) | INTRAVENOUS | Status: DC
  Administered 2020-04-02: 40 mg via INTRAVENOUS
  Filled 2020-04-01: qty 40

## 2020-04-01 MED ORDER — ENOXAPARIN SODIUM 40 MG/0.4ML ~~LOC~~ SOLN: 40.0000 mg | SUBCUTANEOUS | Status: DC

## 2020-04-01 MED ORDER — KCL-LACTATED RINGERS-D5W 20 MEQ/L IV SOLN
INTRAVENOUS | Status: DC
  Filled 2020-04-01 (×3): qty 1000

## 2020-04-01 MED ORDER — METOPROLOL TARTRATE 5 MG/5ML IV SOLN
5.0000 mg | INTRAVENOUS | Status: DC | PRN
  Filled 2020-04-01: qty 5

## 2020-04-01 MED ORDER — ACETAMINOPHEN 10 MG/ML IV SOLN
1000.0000 mg | Freq: Four times a day (QID) | INTRAVENOUS | Status: DC
  Administered 2020-04-01: 1000 mg via INTRAVENOUS
  Filled 2020-04-01 (×2): qty 100

## 2020-04-01 MED ORDER — DEXTROSE 50 % IV SOLN
INTRAVENOUS | Status: AC
  Administered 2020-04-01: 25 mL
  Filled 2020-04-01: qty 50

## 2020-04-01 NOTE — Progress Notes (Signed)
Patient lost 2 IV sites. IV team attempted to replace IV's, but patient refused. Patient only has one working IV site at this time. Patient is on several different IV medications that interact with one another. This RN informed the patient of the importance of these medications and why we need multiple IV's. Patient verbalized understanding and continued to refuse IV stick. Patient also stated she did not want anything else hooked up to her until she spoke with her doctor. Sister who is at the bedside is aware and  I also notified the NP on call. Will continue to monitor the patient closely.

## 2020-04-01 NOTE — Progress Notes (Signed)
  Wound measures 12cm long, 2cm wide, 2.5cm deep. Healthy beefy red granulation tissue at the base. No false bottom, tunneling or tracking. Continue wound vac, M/W/F.   Alferd Apa, [facility] 04/01/2020, 11:40 AM

## 2020-04-01 NOTE — Evaluation (Addendum)
Physical Therapy Evaluation Patient Details Name: Lori Alvarez MRN: [id number] DOB: 1960-12-30 Today's Date: 04/01/2020   History of Present Illness  Lori Alvarez is a 59 y.o. WF PMHx  ALS, MDS, SBO, Appendectomy, collagenous colitis, partial Hysterectomy, postherpetic neuralgia.right-sided chest pain described as a sharp pain occasionally radiating to the center of her chest and her left side of her back, pain is moderate-severe constant no alleviating or aggravating factors. Found to have pneumoperitoneum of uncertain etiology.  Possible duodenal ulcer perforation versus small bowel perforation. S/P exp lap finding DUODENAL PERFORATION (PROBABLE ULCER) and ILEAL STRICTURE WITH SMALL BOWEL OBSTRUCTION  Clinical Impression  The patient presents with significant weakness of LE's, did partially stand with  2 assist. Patient has no functional use of Bilateral UE's, relies on spouse for some ADLs, was ambulating without AD, attending OPPT for UE's PTA. The patient may benefit from post acute rehab. Pt admitted with above diagnosis.  Pt currently with functional limitations due to the deficits listed below (see PT Problem List). Pt will benefit from skilled PT to increase their independence and safety with mobility to allow discharge to the venue listed below.  ' Patient's HR 125-130.     Follow Up Recommendations CIR    Equipment Recommendations  Wheelchair (measurements PT);Wheelchair cushion (measurements PT)    Recommendations for Other Services       Precautions / Restrictions Precautions Precautions: Fall Precaution Comments: ABD VAC, midline JP, NG suction,no functional UE use. HR to 130      Mobility  Bed Mobility Overal bed mobility: Needs Assistance Bed Mobility: Rolling;Sidelying to Sit;Sit to Sidelying Rolling: Total assist;+2 for physical assistance;+2 for safety/equipment Sidelying to sit: Total assist;+2 for physical assistance;+2 for safety/equipment;HOB elevated      Sit to sidelying: +2 for physical assistance;+2 for safety/equipment General bed mobility comments: patient able to move legs towards bed edge, required assist for trunk to upright position. Total assist to returnt to side  Transfers Overall transfer level: Needs assistance Equipment used: 2 person hand held assist Transfers: Sit to/from Stand Sit to Stand: Max assist;+2 physical assistance;+2 safety/equipment         General transfer comment: partial stand from bed with UE support  Ambulation/Gait             General Gait Details: NT  Stairs            Wheelchair Mobility    Modified Rankin (Stroke Patients Only)       Balance Overall balance assessment: Needs assistance Sitting-balance support: No upper extremity supported;Feet supported Sitting balance-Leahy Scale: Fair         Standing balance comment: unable to fully stand                             Pertinent Vitals/Pain Pain Assessment: Faces Pain Score: 7  Pain Location: mid abdomen to sit up and return to supine Pain Descriptors / Indicators: Discomfort;Moaning;Jabbing Pain Intervention(s): Monitored during session;Premedicated before session;Limited activity within patient's tolerance;Repositioned    Home Living Family/patient expects to be discharged to:: Private residence Living Arrangements: Spouse/significant other Available Help at Discharge: Family;Available 24 hours/day Type of Home: House       Home Layout: Two level;Bed/bath upstairs Home Equipment: None      Prior Function Level of Independence: Needs assistance   Gait / Transfers Assistance Needed: able to ambulae without support, walked into OPPT clinic 5 days ago  ADL's / [employer]  Needed: spouse assited with feeding, stands for shower        Hand Dominance        Extremity/Trunk Assessment        Lower Extremity Assessment Lower Extremity Assessment: Generalized weakness;RLE  deficits/detail;LLE deficits/detail RLE Deficits / Details: grossly 3/5 hip flexion, knee extension LLE Deficits / Details: same as rt.    Cervical / Trunk Assessment Cervical / Trunk Assessment: Other exceptions Cervical / Trunk Exceptions: unable to flex head from pillow  Communication      Cognition Arousal/Alertness: Awake/alert Behavior During Therapy: Anxious Overall Cognitive Status: Within Functional Limits for tasks assessed                                        General Comments      Exercises     Assessment/Plan    PT Assessment Patient needs continued PT services  PT Problem List Decreased strength;Decreased knowledge of precautions;Decreased balance;Decreased mobility;Decreased knowledge of use of DME;Cardiopulmonary status limiting activity;Decreased activity tolerance;Impaired sensation;Decreased safety awareness       PT Treatment Interventions Therapeutic activities;Gait training;Therapeutic exercise;Patient/family education;Balance training;Functional mobility training    PT Goals (Current goals can be found in the Care Plan section)  Acute Rehab PT Goals Patient Stated Goal: to get back on my feet, go to PT PT Goal Formulation: With patient/family Time For Goal Achievement: 04/15/20 Potential to Achieve Goals: Fair    Frequency Min 3X/week   Barriers to discharge        Co-evaluation PT/OT/SLP Co-Evaluation/Treatment: Yes Reason for Co-Treatment: Complexity of the patient's impairments (multi-system involvement);To address functional/ADL transfers;For patient/therapist safety PT goals addressed during session: Mobility/safety with mobility OT goals addressed during session: ADL's and self-care       AM-PAC PT "6 Clicks" Mobility  Outcome Measure Help needed turning from your back to your side while in a flat bed without using bedrails?: Total Help needed moving from lying on your back to sitting on the side of a flat bed  without using bedrails?: Total Help needed moving to and from a bed to a chair (including a wheelchair)?: Total Help needed standing up from a chair using your arms (e.g., wheelchair or bedside chair)?: Total Help needed to walk in hospital room?: Total Help needed climbing 3-5 steps with a railing? : Total 6 Click Score: 6    End of Session   Activity Tolerance: Patient limited by pain;Patient limited by fatigue Patient left: in bed;with call bell/phone within reach;with family/visitor present Nurse Communication: Mobility status PT Visit Diagnosis: Unsteadiness on feet (R26.81);Muscle weakness (generalized) (M62.81);Difficulty in walking, not elsewhere classified (R26.2)    Time: 0321-2248 PT Time Calculation (min) (ACUTE ONLY): 41 min   Charges:   PT Evaluation $PT Eval Moderate Complexity: [place] Pager [phone] Office [phone]   Claretha Cooper 04/01/2020, 3:01 PM

## 2020-04-01 NOTE — Progress Notes (Signed)
PROGRESS NOTE  Lori Alvarez  OHY:[id number] DOB: June 27, 1961 DOA: 03/30/2020 PCP: Shirline Frees, MD   Brief Narrative: Lori Alvarez is a 59 y.o. female with a history of ALS, hysterectomy, appendectomy, recurrent SBO who presented to the ED with lower chest, upper abdominal pain. No significant cardiac or pulmonary etiologies found, though CT with free air with gas and duodenal bulb suspicious for perforation, dilated small bowel with probable obstruction. She underwent emergency surgery with OR findings of massive pneumoperitoneum, duodenal perforation at bulb, purulent peritonitis, dilated small bowel with stricture, possible source of chronic obstruction. This was treated with omental Phillip Heal patch and ileal resection with side-to-side anastomosis. Weaning off vent was surprisingly uncomplicated and she remains in ICU with palliative care consulting, still with NG tube.   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  - PPI gtt x72 hrs > BID thereafter. - Continue zosyn. Blood cultures NGTD x2 days. - EGD recommended in 6-8 weeks to follow for resolution, follows with Dr. Alessandra Bevels.  - Continuing NG tube for internal drainage/decompression and Blake drain for external drainage.  - Will check amylase from drain output today to r/o persistent leak. Once advanced diet and no evidence of leak, can remove Blake drain prior to discharge.  - Per  surgery - Appreciate palliative care assistance with pain control including scheduled tylenol, prn morphine with antiemetics and robaxin.   Sinus tachycardia: Physiologic response to inflammatory state/fever.  - Treat fever with tylenol scheduled (reordered), increase IVF given more insensible losses.  - Hgb stable, doubt bleeding. BP not low, not consistent with untreated sepsis.  Hypophosphatemia:  - Supplement ordered, will monitor  Recurrent/chronic SBO, ileal stricture: s/p ileal resection and side-to-side anastomosis.   - Continue NG decompression until ileus resolved, then clamping trial vs. UGI series possibly tmrw.  - Per surgery  ALS: Dx August 2017, first had left hand weakness, followed by ALS clinic, Dr. Brynda Rim. - Continue as much PT/OT as reasonably possible to avoid deconditioning, help with ileus. - Robaxin IV for po baclofen, restart lyrica, duloxetine, and hyoscyamine when able to take po  Anxiety:  - Ativan prn as sub for xanax home med  Vitamin B12 deficiency:  - Restart supplementation once taking po   DVT prophylaxis: SCDs per surgery Code Status: Full Family Communication: Multiple at bedside this AM Disposition Plan: Can continue care in progressive unit. Status is: Inpatient  Remains inpatient appropriate because:Ongoing active pain requiring inpatient pain management and IV treatments appropriate due to intensity of illness or inability to take PO   Dispo: The patient is from: Home              Anticipated d/c is to: TBD              Anticipated d/c date is: > 3 days              Patient currently is not medically stable to d/c.  Consultants:   General surgery  Palliative care  medicine team  Procedures:   03/30/2020 Dr. Johney Maine: Ex lap, omental patch and ileal resection  Antimicrobials:  Zosyn 6/12 >>    Subjective: Abdominal pain is stable, felt fever last night, no chest pain or dyspnea. No edema or bleeding.   Objective: Vitals:    04/01/20 1000 04/01/20 1144 04/01/20 1200 04/01/20 1202  BP:  (!) 147/73 (!) 153/93   Pulse: (!) 125 (!) 131 (!) 126   Resp: (!) 24 (!) 29 (!) 27   Temp:    99.8 F (37.7 C)  TempSrc:    Axillary  SpO2: 100% 100% 99%   Weight:      Height:        Intake/Output Summary (Last 24 hours) at 04/01/2020 1234 Last data filed at 04/01/2020 1227 Gross per 24 hour  Intake 2862.19 ml  Output 2115 ml  Net 747.19 ml   Filed Weights   03/30/20 1019  Weight: 53.5 kg   Gen: Pleasant female in no distress Pulm: Nonlabored breathing room air. Clear. CV: Regular tachycardia in 110's. No murmur, rub, or gallop. Active precordium. No JVD, no dependent edema. GI: Abdomen soft, tender appropriately without taut distention, +BS.  Ext: Warm, UE weakness stable Skin: No new rashes, lesions or ulcers on visualized skin. Wound vac in midline wound without surrounding erythema, drain site c/d/i. Neuro: Alert and oriented. No new focal neurological deficits. Psych: Judgement and insight appear fair. Mood euthymic & affect congruent. Behavior is appropriate.    Data Reviewed: I have personally reviewed following labs and imaging studies  CBC: Recent Labs  Lab 03/30/20 1109 03/31/20 0227 04/01/20 0326  WBC 12.8* 12.0* 13.2*  NEUTROABS 11.3* 11.0* 11.5*  HGB 12.2 10.5* 10.5*  HCT 39.4 32.8* 33.3*  MCV 92.5 89.4 92.0  PLT 436* 392 096*   Basic Metabolic Panel: Recent Labs  Lab 03/30/20 1109 03/31/20 0647 04/01/20 0326  NA 141 138 141  K 4.4 4.1 3.7  CL 106 106 106  CO2 25 20* 20*  GLUCOSE 124* 108* 75  BUN 13 7 9   CREATININE 0.45 0.37* 0.45  CALCIUM 8.8* 8.4* 9.2  MG  --  1.7 2.1  PHOS  --  2.5 1.4*   GFR: Estimated Creatinine Clearance: 61.9 mL/min (by C-G formula based on SCr of 0.45 mg/dL). Liver Function Tests: Recent Labs  Lab 03/30/20 1109 03/31/20 0647 04/01/20 0326  AST 17 19 22   ALT 12 14 13   ALKPHOS 75 46 61  BILITOT 0.6 0.6 1.4*  PROT 6.4* 6.1* 6.4*  ALBUMIN 3.5  3.5 3.5   Recent Labs  Lab 03/30/20 1109 04/01/20 0326  LIPASE 113*  --   AMYLASE  --  57   No results for input(s): AMMONIA in the last 168 hours. Coagulation Profile: No results for input(s): INR, PROTIME in the last 168 hours. Cardiac Enzymes: No results for input(s): CKTOTAL, CKMB, CKMBINDEX, TROPONINI in the last 168 hours. BNP (last 3 results) No results for input(s): PROBNP in the last 8760 hours. HbA1C: No results for input(s): HGBA1C in the last 72 hours. CBG: Recent Labs  Lab 03/30/20 2319 04/01/20 0624 04/01/20 0650  GLUCAP 125* 64* 88   Lipid Profile: No results for input(s): CHOL, HDL, LDLCALC, TRIG, CHOLHDL, LDLDIRECT in the last 72 hours. Thyroid Function Tests: No results for input(s): TSH, T4TOTAL, FREET4, T3FREE, THYROIDAB in the last 72 hours. Anemia Panel: No results for input(s): VITAMINB12, FOLATE, FERRITIN, TIBC, IRON, RETICCTPCT in the last 72 hours. Urine analysis:    Component  Value Date/Time   COLORURINE STRAW (A) 03/01/2020 1619   APPEARANCEUR CLEAR 03/01/2020 1619   LABSPEC 1.004 (L) 03/01/2020 1619   PHURINE 6.0 03/01/2020 1619   GLUCOSEU NEGATIVE 03/01/2020 1619   HGBUR NEGATIVE 03/01/2020 1619   BILIRUBINUR NEGATIVE 03/01/2020 1619   KETONESUR 20 (A) 03/01/2020 1619   PROTEINUR NEGATIVE 03/01/2020 1619   NITRITE NEGATIVE 03/01/2020 1619   LEUKOCYTESUR NEGATIVE 03/01/2020 1619   Recent Results (from the past 240 hour(s))  Blood culture (routine x 2)     Status: None (Preliminary result)   Collection Time: 03/30/20  1:48 PM   Specimen: BLOOD LEFT WRIST  Result Value Ref Range Status   Specimen Description BLOOD LEFT WRIST  Final   Special Requests   Final    BOTTLES DRAWN AEROBIC AND ANAEROBIC Blood Culture results may not be optimal due to an inadequate volume of blood received in culture bottles   Culture   Final    NO GROWTH 2 DAYS Performed at [facility], [place] [address] [place], [place] [address]    Report Status  PENDING  Incomplete  SARS Coronavirus 2 by RT PCR (hospital order, performed in [facility] hospital lab) Nasopharyngeal Nasopharyngeal Swab     Status: None   Collection Time: 03/30/20  2:36 PM   Specimen: Nasopharyngeal Swab  Result Value Ref Range Status   SARS Coronavirus 2 NEGATIVE NEGATIVE Final    Comment: (NOTE) SARS-CoV-2 target nucleic acids are NOT DETECTED.  The SARS-CoV-2 RNA is generally detectable in upper and lower respiratory specimens during the acute phase of infection. The lowest concentration of SARS-CoV-2 viral copies this assay can detect is 250 copies / mL. A negative result does not preclude SARS-CoV-2 infection and should not be used as the sole basis for treatment or other patient management decisions.  A negative result may occur with improper specimen collection / handling, submission of specimen other than nasopharyngeal swab, presence of viral mutation(s) within the areas targeted by this assay, and inadequate number of viral copies (<250 copies / mL). A negative result must be combined with clinical observations, patient history, and epidemiological information.  Fact Sheet for Patients:   StrictlyIdeas.no  Fact Sheet for Healthcare Providers: BankingDealers.co.za  This test is not yet approved or  cleared by the [place] FDA and has been authorized for detection and/or diagnosis of SARS-CoV-2 by FDA under an Emergency Use Authorization (EUA).  This EUA will remain in effect (meaning this test can be used) for the duration of the COVID-19 declaration under Section 564(b)(1) of the Act, 21 U.S.C. section 360bbb-3(b)(1), unless the authorization is terminated or revoked sooner.  Performed at [facility], [place] [address] [place], [place] [address]   Surgical PCR screen     Status: None   Collection Time: 03/30/20  3:01 PM   Specimen: Nasal Mucosa; Nasal Swab  Result Value Ref  Range Status   MRSA, PCR NEGATIVE NEGATIVE Final   Staphylococcus aureus NEGATIVE NEGATIVE Final    Comment: (NOTE) The Xpert SA Assay (FDA approved for NASAL specimens in patients 57 years of age and older), is one component of a comprehensive surveillance program. It is not intended to diagnose infection nor to guide or monitor treatment. Performed at [facility], [place] [address], [place], [place] [address]       Radiology Studies: CT Angio Chest/Abd/Pel for Dissection W and/or Wo Contrast  Result Date: 03/30/2020 CLINICAL DATA:  Epigastric pain radiating into right abdomen  and back. History of ALS. EXAM: CT ANGIOGRAPHY CHEST, ABDOMEN AND PELVIS TECHNIQUE: Non-contrast CT of the chest was initially obtained. Multidetector CT imaging through the chest, abdomen and pelvis was performed using the standard protocol during bolus administration of intravenous contrast. Multiplanar reconstructed images and MIPs were obtained and reviewed to evaluate the vascular anatomy. CONTRAST:  146mL OMNIPAQUE IOHEXOL 350 MG/ML SOLN COMPARISON:  CT of the abdomen and pelvis on 03/01/2020 FINDINGS: CTA CHEST FINDINGS Cardiovascular: The thoracic aorta is normal in caliber and demonstrates no evidence of dissection. There is calcified plaque at the level of the aortic arch. Visualized proximal great vessels are normally patent. Pulmonary arteries are also well opacified and demonstrate normal patency and normal caliber. The heart size is normal. No pericardial fluid identified. No significant calcified coronary artery plaque identified. Mediastinum/Nodes: No enlarged mediastinal, hilar, or axillary lymph nodes. Thyroid gland, trachea, and esophagus demonstrate no significant findings. Lungs/Pleura: Bibasilar atelectasis present. There is a trace amount pleural fluid bilaterally, right greater than left. There is no evidence of pulmonary edema, focal airspace consolidation, pneumothorax, nodule or  pleural fluid. Musculoskeletal: No chest wall abnormality. No acute or significant osseous findings. Review of the MIP images confirms the above findings. CTA ABDOMEN AND PELVIS FINDINGS VASCULAR Aorta: Calcified plaque in the abdominal aorta without evidence of aneurysmal disease or dissection. Celiac: Normally patent.  Normal branch vessel patency. SMA: Normally patent. Renals: Two separate left and two separate right renal arteries demonstrate normal patency. The lower pole artery on the right emanates off of the distal aorta nearly at the aortic bifurcation. IMA: Normally patent. Inflow: Normally patent bilateral iliac arteries and common femoral arteries. Review of the MIP images confirms the above findings. NON-VASCULAR Hepatobiliary: No focal liver abnormality is seen. No gallstones, gallbladder wall thickening, or biliary dilatation. Pancreas: Unremarkable. No pancreatic ductal dilatation or surrounding inflammatory changes. Spleen: Normal in size without focal abnormality. Adrenals/Urinary Tract: Adrenal glands are unremarkable. Kidneys are normal, without renal calculi, focal lesion, or hydronephrosis. Bladder is unremarkable. Stomach/Bowel: Moderate free intraperitoneal air is present in the peritoneal cavity. The stomach is distended with air and there is some potential inflammation and thickening of the proximal duodenum. At the level of the mid and lower abdomen, there are multiple dilated small bowel loops reaching maximal caliber of approximately 4 cm. The dilated loops are inflamed in appearance and hyperemic. One transversely oriented mid small bowel loop appears thickened and there is a layer of free intraperitoneal air anterior to this bowel loop. Lymphatic: No enlarged lymph nodes identified. Reproductive: Status post hysterectomy. No adnexal masses. Other: Small amount of free fluid in the pelvis and interspersed between some lower dilated small bowel loops. No focal abscess. Musculoskeletal:  No acute or significant osseous findings. Review of the MIP images confirms the above findings. IMPRESSION: 1. No evidence of aortic dissection. 2. Moderate free intraperitoneal air in the peritoneal cavity. At the level of the mid and lower abdomen, there are multiple dilated small bowel loops reaching maximal caliber of approximately 4 cm. The dilated loops are inflamed in appearance and hyperemic. One of the mid small bowel loops appears decompressed and thickened and there is a layer of free intraperitoneal air anterior to this bowel loop. Findings are consistent with a partial small bowel obstruction/ileus and small bowel perforation may be the source of free air. 3. Possible inflammation and thickening of the proximal duodenum. Perforated duodenal ulcer also cannot be excluded as a potential source for free intraperitoneal air. 4. Trace amount  of pleural fluid bilaterally, right greater than left. 5. Small amount of free fluid in the abdomen and pelvis. These results were called by telephone at the time of interpretation on 03/30/2020 at 1:25 PM to provider [facility] , who verbally acknowledged these results. Electronically Signed   By: Aletta Edouard M.D.   On: 03/30/2020 13:34    Scheduled Meds: . Chlorhexidine Gluconate Cloth  6 each Topical Daily  . enoxaparin (LOVENOX) injection  40 mg Subcutaneous Q24H  . lip balm  1 application Topical BID  . mupirocin ointment  1 application Nasal BID  . ondansetron (ZOFRAN) IV  4 mg Intravenous Q8H  . [START ON 04/03/2020] pantoprazole  40 mg Intravenous Q12H   Continuous Infusions: . acetaminophen Stopped (04/01/20 1156)  . dextrose 5% lactated ringers with KCl 20 mEq/L 100 mL/hr at 04/01/20 1227  . lactated ringers    . methocarbamol (ROBAXIN) IV Stopped (04/01/20 2263)  . pantoprozole (PROTONIX) infusion 8 mg/hr (04/01/20 1227)  . piperacillin-tazobactam (ZOSYN)  IV Stopped (04/01/20 1111)  . sodium phosphate  Dextrose 5% IVPB 30 mmol  (04/01/20 1227)     LOS: 2 days   Time spent: 35 minutes.  Patrecia Pour, MD [facility] [email or web address] 04/01/2020, 12:34 PM

## 2020-04-01 NOTE — Progress Notes (Signed)
Rehab Admissions Coordinator Note:  Per PT and OT recommendation, this patient was screened by Raechel Ache for appropriateness for an Inpatient Acute Rehab Consult.  At this time, we are recommending Inpatient Rehab consult. AC will place consult order per protocol.   Raechel Ache 04/01/2020, 6:03 PM  I can be reached at [phone].

## 2020-04-01 NOTE — Progress Notes (Signed)
Hypoglycemic Event  CBG: 64  Treatment: 12.5 D50 given   Symptoms: confusion  Follow-up CBG: UIV[facility]:4643 CBG Result:88  Possible Reasons for Event: NPO  Comments/MD notified:X. Blount notified via [employer]

## 2020-04-01 NOTE — Evaluation (Signed)
Occupational Therapy Evaluation Patient Details Name: Lori Alvarez MRN: [id number] DOB: [birth year] Today's Date: 04/01/2020    History of Present Illness Lori Alvarez is a 59 y.o. WF PMHx  ALS, MDS, SBO, Appendectomy, collagenous colitis, partial Hysterectomy, postherpetic neuralgia.right-sided chest pain described as a sharp pain occasionally radiating to the center of her chest and her left side of her back, pain is moderate-severe constant no alleviating or aggravating factors. Found to have pneumoperitoneum of uncertain etiology.  Possible duodenal ulcer perforation versus small bowel perforation. S/P exp lap finding DUODENAL PERFORATION (PROBABLE ULCER) and ILEAL STRICTURE WITH SMALL BOWEL OBSTRUCTION   Clinical Impression    Pt admitted with the above. Pt currently with functional limitations due to the deficits listed below (see OT Problem List).  Pt will benefit from skilled OT to increase their safety and independence with ADL and functional mobility for ADL to facilitate discharge to venue listed below.   Pt is very weak.  Pts husband will not be able to care for her at current level.  Briefly mentioned post acute rehab to pt and husband.  Pt was doing well prior to this surgery. Pt is very anxious about entire situation     Follow Up Recommendations  SNF;CIR;Supervision/Assistance - 24 hour    Equipment Recommendations  None recommended by OT;Hospital bed    Recommendations for Other Services       Precautions / Restrictions Precautions Precautions: Fall Precaution Comments: ABD VAC, midline JP, NG suction,no functional UE use. HR to 130      Mobility Bed Mobility Overal bed mobility: Needs Assistance Bed Mobility: Rolling;Sidelying to Sit;Sit to Sidelying Rolling: Total assist;+2 for physical assistance;+2 for safety/equipment Sidelying to sit: Total assist;+2 for physical assistance;+2 for safety/equipment;HOB elevated     Sit to sidelying: +2 for  physical assistance;+2 for safety/equipment General bed mobility comments: patient able to move legs towards bed edge, required assist for trunk to upright position. Total assist to returnt to side  Transfers Overall transfer level: Needs assistance Equipment used: 2 person hand held assist Transfers: Sit to/from Stand Sit to Stand: Max assist;+2 physical assistance;+2 safety/equipment         General transfer comment: partial stand from bed with UE support    Balance Overall balance assessment: Needs assistance Sitting-balance support: No upper extremity supported;Feet supported Sitting balance-Leahy Scale: Fair         Standing balance comment: unable to fully stand                           ADL either performed or assessed with clinical judgement   ADL Overall ADL's : Needs assistance/impaired                                       General ADL Comments: pt dependent with all ADL activity at this time.  OT did support RUE and pt able to scratch face with max A     Vision Patient Visual Report: No change from baseline       Perception     Praxis      Pertinent Vitals/Pain Pain Assessment: Faces Pain Score: 7  Pain Location: mid abdomen to sit up and return to supine Pain Descriptors / Indicators: Discomfort;Moaning;Jabbing Pain Intervention(s): Limited activity within patient's tolerance;Monitored during session     Hand Dominance     Extremity/Trunk Assessment Upper  Extremity Assessment Upper Extremity Assessment: RUE deficits/detail;LUE deficits/detail RUE Deficits / Details: unable to lift BUE against gravity.  In gravity elimated position pt with some movement   Lower Extremity Assessment Lower Extremity Assessment: Generalized weakness;RLE deficits/detail;LLE deficits/detail RLE Deficits / Details: grossly 3/5 hip flexion, knee extension LLE Deficits / Details: same as rt.   Cervical / Trunk Assessment Cervical / Trunk  Assessment: Other exceptions Cervical / Trunk Exceptions: unable to flex head from pillow   Communication Communication Communication: No difficulties   Cognition Arousal/Alertness: Awake/alert Behavior During Therapy: Anxious Overall Cognitive Status: Within Functional Limits for tasks assessed                                                Home Living Family/patient expects to be discharged to:: Private residence Living Arrangements: Spouse/significant other Available Help at Discharge: Family;Available 24 hours/day Type of Home: House       Home Layout: Two level;Bed/bath upstairs Alternate Level Stairs-Number of Steps: has a stair glide   Bathroom Shower/Tub: Walk-in shower         Home Equipment: None          Prior Functioning/Environment Level of Independence: Needs assistance  Gait / Transfers Assistance Needed: able to ambulae without support ADL's / Homemaking Assistance Needed: spouse assited with feeding, stands for shower Communication / Swallowing Assistance Needed: speech is slow and effortful          OT Problem List: Decreased strength;Decreased activity tolerance;Impaired balance (sitting and/or standing);Decreased range of motion;Impaired UE functional use;Decreased safety awareness      OT Treatment/Interventions: Self-care/ADL training;Patient/family education;DME and/or AE instruction;Therapeutic activities    OT Goals(Current goals can be found in the care plan section) Acute Rehab OT Goals Patient Stated Goal: to get back on my feet, go to PT OT Goal Formulation: With patient Time For Goal Achievement: 04/08/20 Potential to Achieve Goals: Good  OT Frequency: Min 3X/week   Barriers to D/C:            Co-evaluation   Reason for Co-Treatment: Complexity of the patient's impairments (multi-system involvement);For patient/therapist safety;To address functional/ADL transfers PT goals addressed during session:  Mobility/safety with mobility OT goals addressed during session: ADL's and self-care      AM-PAC OT "6 Clicks" Daily Activity     Outcome Measure Help from another person eating meals?: Total Help from another person taking care of personal grooming?: Total Help from another person toileting, which includes using toliet, bedpan, or urinal?: Total Help from another person bathing (including washing, rinsing, drying)?: Total Help from another person to put on and taking off regular upper body clothing?: Total Help from another person to put on and taking off regular lower body clothing?: Total 6 Click Score: 6   End of Session Nurse Communication: Mobility status  Activity Tolerance: Patient limited by fatigue Patient left: in bed;with call bell/phone within reach;with family/visitor present  OT Visit Diagnosis: Unsteadiness on feet (R26.81);Muscle weakness (generalized) (M62.81);Other abnormalities of gait and mobility (R26.89)                Time: 1315-1400 OT Time Calculation (min): 45 min Charges:  OT General Charges $OT Visit: 1 Visit OT Evaluation $OT Eval Moderate Complexity: 1 Mod  Kari Baars, [place] [phone] Office- [phone]     Kean Gautreau, [place]  D 04/01/2020, 5:16 PM

## 2020-04-01 NOTE — Progress Notes (Signed)
2 Days Post-Op  Subjective: CC: Patient reports pain in her lower abdomen b/l. Nausea after pain medication. Pallative saw yesterday to help with nausea and pain control. Reports small amount of flatus yesterday afternoon. No BM. Foley out this am. Has not voided since that time.  Objective: Vital signs in last 24 hours: Temp:  [98.5 F (36.9 C)-100.5 F (38.1 C)] 99 F (37.2 C) (06/14 0803) Pulse Rate:  [106-131] 118 (06/14 0420) Resp:  [17-26] 23 (06/14 0420) BP: (108-141)/(44-69) 141/55 (06/14 0410) SpO2:  [97 %-100 %] 98 % (06/14 0420) Last BM Date:  (PTA)  Intake/Output from previous day: 06/13 0701 - 06/14 0700 In: 2316.4 [I.V.:1698.3; IV Piggyback:618.1] Out: 2390 [Urine:1800; Emesis/NG output:475; Drains:115] Intake/Output this shift: Total I/O In: 405.7 [I.V.:292.8; IV Piggyback:112.8] Out: -   PE: Gen:  Alert, NAD, pleasant Card - Tachycardic  Abd: Soft, mild distension, tenderness in the lower abdomen b/l (R>L) without peritonitis, +BS, drain in place with SS output. 115/24 hours. NGT in place with bilious output. 475/24 hours.  Ext:  No LE edema  Psych: A&Ox3  Skin: no rashes noted, warm and dry  Lab Results:  Recent Labs    03/31/20 0227 04/01/20 0326  WBC 12.0* 13.2*  HGB 10.5* 10.5*  HCT 32.8* 33.3*  PLT 392 414*   BMET Recent Labs    03/31/20 0647 04/01/20 0326  NA 138 141  K 4.1 3.7  CL 106 106  CO2 20* 20*  GLUCOSE 108* 75  BUN 7 9  CREATININE 0.37* 0.45  CALCIUM 8.4* 9.2   PT/INR No results for input(s): LABPROT, INR in the last 72 hours. CMP     Component Value Date/Time   NA 141 04/01/2020 0326   K 3.7 04/01/2020 0326   CL 106 04/01/2020 0326   CO2 20 (L) 04/01/2020 0326   GLUCOSE 75 04/01/2020 0326   BUN 9 04/01/2020 0326   CREATININE 0.45 04/01/2020 0326   CALCIUM 9.2 04/01/2020 0326   PROT 6.4 (L) 04/01/2020 0326   ALBUMIN 3.5 04/01/2020 0326   AST 22 04/01/2020 0326   ALT 13 04/01/2020 0326   ALKPHOS 61  04/01/2020 0326   BILITOT 1.4 (H) 04/01/2020 0326   GFRNONAA >60 04/01/2020 0326   GFRAA >60 04/01/2020 0326   Lipase     Component Value Date/Time   LIPASE 113 (H) 03/30/2020 1109       Studies/Results: DG Abdomen Acute W/Chest  Result Date: 03/30/2020 CLINICAL DATA:  Epigastric pain radiating to right side. History of ALS. EXAM: DG ABDOMEN ACUTE W/ 1V CHEST COMPARISON:  Chest x-ray 09/05/2019 and abdominal films 03/03/2020 FINDINGS: Patient is slightly rotated to the right. Lungs are hypoinflated but otherwise clear. Cardiomediastinal silhouette and remainder of the chest is unremarkable. Abdominopelvic images demonstrate several air-filled dilated central small bowel loops measuring up to 4.2 cm in diameter. Mild amount of air and stool throughout the colon. Mild gastric distension. No free peritoneal air. Remainder the exam is unchanged. IMPRESSION: 1. Several dilated air-filled central small bowel loops with mild air throughout the colon. Findings may be seen and ileus versus early/partial small bowel obstruction. 2.  Hypoinflation without acute cardiopulmonary disease. Electronically Signed   By: Marin Olp M.D.   On: 03/30/2020 12:52   CT Angio Chest/Abd/Pel for Dissection W and/or Wo Contrast  Result Date: 03/30/2020 CLINICAL DATA:  Epigastric pain radiating into right abdomen and back. History of ALS. EXAM: CT ANGIOGRAPHY CHEST, ABDOMEN AND PELVIS TECHNIQUE: Non-contrast CT of the  chest was initially obtained. Multidetector CT imaging through the chest, abdomen and pelvis was performed using the standard protocol during bolus administration of intravenous contrast. Multiplanar reconstructed images and MIPs were obtained and reviewed to evaluate the vascular anatomy. CONTRAST:  133mL OMNIPAQUE IOHEXOL 350 MG/ML SOLN COMPARISON:  CT of the abdomen and pelvis on 03/01/2020 FINDINGS: CTA CHEST FINDINGS Cardiovascular: The thoracic aorta is normal in caliber and demonstrates no evidence  of dissection. There is calcified plaque at the level of the aortic arch. Visualized proximal great vessels are normally patent. Pulmonary arteries are also well opacified and demonstrate normal patency and normal caliber. The heart size is normal. No pericardial fluid identified. No significant calcified coronary artery plaque identified. Mediastinum/Nodes: No enlarged mediastinal, hilar, or axillary lymph nodes. Thyroid gland, trachea, and esophagus demonstrate no significant findings. Lungs/Pleura: Bibasilar atelectasis present. There is a trace amount pleural fluid bilaterally, right greater than left. There is no evidence of pulmonary edema, focal airspace consolidation, pneumothorax, nodule or pleural fluid. Musculoskeletal: No chest wall abnormality. No acute or significant osseous findings. Review of the MIP images confirms the above findings. CTA ABDOMEN AND PELVIS FINDINGS VASCULAR Aorta: Calcified plaque in the abdominal aorta without evidence of aneurysmal disease or dissection. Celiac: Normally patent.  Normal branch vessel patency. SMA: Normally patent. Renals: Two separate left and two separate right renal arteries demonstrate normal patency. The lower pole artery on the right emanates off of the distal aorta nearly at the aortic bifurcation. IMA: Normally patent. Inflow: Normally patent bilateral iliac arteries and common femoral arteries. Review of the MIP images confirms the above findings. NON-VASCULAR Hepatobiliary: No focal liver abnormality is seen. No gallstones, gallbladder wall thickening, or biliary dilatation. Pancreas: Unremarkable. No pancreatic ductal dilatation or surrounding inflammatory changes. Spleen: Normal in size without focal abnormality. Adrenals/Urinary Tract: Adrenal glands are unremarkable. Kidneys are normal, without renal calculi, focal lesion, or hydronephrosis. Bladder is unremarkable. Stomach/Bowel: Moderate free intraperitoneal air is present in the peritoneal cavity.  The stomach is distended with air and there is some potential inflammation and thickening of the proximal duodenum. At the level of the mid and lower abdomen, there are multiple dilated small bowel loops reaching maximal caliber of approximately 4 cm. The dilated loops are inflamed in appearance and hyperemic. One transversely oriented mid small bowel loop appears thickened and there is a layer of free intraperitoneal air anterior to this bowel loop. Lymphatic: No enlarged lymph nodes identified. Reproductive: Status post hysterectomy. No adnexal masses. Other: Small amount of free fluid in the pelvis and interspersed between some lower dilated small bowel loops. No focal abscess. Musculoskeletal: No acute or significant osseous findings. Review of the MIP images confirms the above findings. IMPRESSION: 1. No evidence of aortic dissection. 2. Moderate free intraperitoneal air in the peritoneal cavity. At the level of the mid and lower abdomen, there are multiple dilated small bowel loops reaching maximal caliber of approximately 4 cm. The dilated loops are inflamed in appearance and hyperemic. One of the mid small bowel loops appears decompressed and thickened and there is a layer of free intraperitoneal air anterior to this bowel loop. Findings are consistent with a partial small bowel obstruction/ileus and small bowel perforation may be the source of free air. 3. Possible inflammation and thickening of the proximal duodenum. Perforated duodenal ulcer also cannot be excluded as a potential source for free intraperitoneal air. 4. Trace amount of pleural fluid bilaterally, right greater than left. 5. Small amount of free fluid in the abdomen  and pelvis. These results were called by telephone at the time of interpretation on 03/30/2020 at 1:25 PM to provider [facility] , who verbally acknowledged these results. Electronically Signed   By: Aletta Edouard M.D.   On: 03/30/2020 13:34     Anti-infectives: Anti-infectives (From admission, onward)   Start     Dose/Rate Route Frequency Ordered Stop   03/31/20 0800  piperacillin-tazobactam (ZOSYN) IVPB 3.375 g     Discontinue     3.375 g 12.5 mL/hr over 240 Minutes Intravenous Every 8 hours 03/31/20 0758 04/05/20 0559   03/30/20 1751  clindamycin (CLEOCIN) 900 mg, gentamicin (GARAMYCIN) 240 mg in sodium chloride 0.9 % 1,000 mL for intraperitoneal lavage  Status:  Discontinued          As needed 03/30/20 1752 03/30/20 1936   03/30/20 1445  clindamycin (CLEOCIN) IVPB 900 mg  Status:  Discontinued       "And" Linked Group Details   900 mg 100 mL/hr over 30 Minutes Intravenous On call to O.R. 03/30/20 1427 03/31/20 0559   03/30/20 1445  gentamicin (GARAMYCIN) 270 mg in dextrose 5 % 100 mL IVPB  Status:  Discontinued       "And" Linked Group Details   5 mg/kg  53.5 kg 106.8 mL/hr over 60 Minutes Intravenous On call to O.R. 03/30/20 1427 03/31/20 0559   03/30/20 1430  clindamycin (CLEOCIN) 900 mg, gentamicin (GARAMYCIN) 240 mg in sodium chloride 0.9 % 1,000 mL for intraperitoneal lavage  Status:  Discontinued       Note to Pharmacy: Have in the  [place] room for final irrigation in bowel surgery case to minimize risk of abscess/infection Pharmacy may adjust dosing strength, schedule, rate of infusion, etc as needed to optimize therapy    Irrigation To Surgery 03/30/20 1427 03/31/20 0758   03/30/20 1330  piperacillin-tazobactam (ZOSYN) IVPB 3.375 g        3.375 g 12.5 mL/hr over 240 Minutes Intravenous  Once 03/30/20 1324 03/30/20 1623       Assessment/Plan ALS  DUODENAL PERFORATION (PROBABLE ULCER) ILEAL STRICTURE WITH SMALL BOWEL OBSTRUCTION S/p EXPLORATORY LAPAROTOMY, OMENTAL GRAHAM PATCH, ILEAL RESECTION - Dr. Johney Maine, 03/30/2020 - POD #2 - Cont IV Zosyn x 5 days - Continue PPI.  - Check drain amylase today. Per Dr. Johney Maine note, if no elevated amylase nor leak and tolerating solid food, can remove drain at the end of this  hospitalization.  - Cont NGT. Will plan for UGI prior to removal - Appreciate palliative assistance on pain and nausea control - Cont wound vac. RN to secure chat prior to wound vac change today  - Mobilize, PT/OT - Pulm toilet  FEN - NPO, NGT, IVF VTE - SCDs, okay for chemical prophylaxis from our standpoint ID - Zosyn  Foley - D/c'd today.  Follow-up - Dr. Johney Maine. Would benefit from outpatient EGD by her gastroenterologist in about 6-8 weeks to make sure ulcer has resolved. POC - Husband and sister in the room. Questions welcomed and answered.    LOS: 2 days    Jillyn Ledger , [facility] Surgery 04/01/2020, 8:44 AM Please see Amion for pager number during day hours 7:00am-4:30pm

## 2020-04-01 NOTE — Progress Notes (Signed)
Pt was lying in bed and said she had just asked Jesus to help her. Her husband later told me privately that she was about to lose it. Pt said she is tired, she is ready to walk into the arms of Jesus. She said she is in pain and said now she can't walk. She said she couldn't walk this morning when "they" tried to get her up. She wanted to know if she if was fair or right to ask God to take her. We talked about scriptures and death and I encouraged her to rest in the [place] until such time that it is her time to be with Him. She was encouraged by that and the conversations we had about others, in the Bible, who had come to the same conclusion about their lives but waited on the [place]. She was very appreciative of the conversation and so was Marlou Sa, her husband. She asked for prayer and was thankful again for the visit. Our visit was ending as staff came to transfer her to 1400. Please page whenever support is needed. [place], MDiv   04/01/20 1800  Clinical Encounter Type  Visited With Patient and family together

## 2020-04-01 NOTE — Progress Notes (Signed)
   04/01/20 1916  Assess: MEWS Score  Temp (!) 97.5 F (36.4 C)  BP (!) 141/77  Pulse Rate (!) 131  Resp (!) 30  SpO2 99 %  Assess: MEWS Score  MEWS Temp 0  MEWS Systolic 0  MEWS Pulse 3  MEWS RR 2  MEWS LOC 0  MEWS Score 5  MEWS Score Color Red  Assess: if the MEWS score is Yellow or Red  Were vital signs taken at a resting state? Yes  Focused Assessment Documented focused assessment  Early Detection of Sepsis Score *See Row Information* High  MEWS guidelines implemented *See Row Information* Yes  Treat  MEWS Interventions Administered prn meds/treatments  Take Vital Signs  Increase Vital Sign Frequency  Red: Q 1hr X 4 then Q 4hr X 4, if remains red, continue Q 4hrs  Escalate  MEWS: Escalate Red: discuss with charge nurse/RN and provider, consider discussing with RRT  Notify: Charge Nurse/RN  Name of Charge Nurse/RN Notified Jess, RN  Date Charge Nurse/RN Notified 04/01/20  Time Charge Nurse/RN Notified 1915  Notify: Provider  Provider Name/Title Bonner Puna MD  Date Provider Notified 04/01/20  Time Provider Notified 1910  Notification Type Page  Notification Reason Other (Comment) (has been red mews)   Pt transferred from ICU at [place]. Pt RED MEWS. BP/RR/HR elevated. Red mews initiated. CN and MD notified. Pt was MEWS RED earlier in the day but there was no documentation of MEWS initiated. Will continue to monitor pt closely

## 2020-04-01 NOTE — TOC Initial Note (Signed)
Transition of Care [facility]) - Initial/Assessment Note    Patient Details  Name: Lori Alvarez MRN: [id number] Date of Birth: [birth year]  Transition of Care [facility]) CM/SW Contact:    Leeroy Cha, RN Phone Number: 04/01/2020, 10:31 AM  Clinical Narrative:                 Pod 2 perforated small bowel Plan: to return home has pcp, lives with husband Will follow for dc needs  Expected Discharge Plan: Home/Self Care Barriers to Discharge: Continued Medical Work up   Patient Goals and CMS Choice Patient states their goals for this hospitalization and ongoing recovery are:: to go home CMS Medicare.gov Compare Post Acute Care list provided to:: Patient    Expected Discharge Plan and Services Expected Discharge Plan: Home/Self Care   Discharge Planning Services: CM Consult   Living arrangements for the past 2 months: Single Family Home                                      Prior Living Arrangements/Services Living arrangements for the past 2 months: Single Family Home Lives with:: Spouse Patient language and need for interpreter reviewed:: No Do you feel safe going back to the place where you live?: Yes      Need for Family Participation in Patient Care: Yes (Comment) Care giver support system in place?: Yes (comment)   Criminal Activity/Legal Involvement Pertinent to Current Situation/Hospitalization: No - Comment as needed  Activities of Daily Living Home Assistive Devices/Equipment: Other (Comment) (stair lift) ADL Screening (condition at time of admission) Patient's cognitive ability adequate to safely complete daily activities?: Yes Is the patient deaf or have difficulty hearing?: No Does the patient have difficulty seeing, even when wearing glasses/contacts?: No Does the patient have difficulty concentrating, remembering, or making decisions?: No Patient able to express need for assistance with ADLs?: Yes Does the patient have difficulty dressing or  bathing?: Yes Independently performs ADLs?: No Communication: Independent Dressing (OT): Needs assistance Is this a change from baseline?: Pre-admission baseline Grooming: Needs assistance Is this a change from baseline?: Pre-admission baseline Feeding: Needs assistance Is this a change from baseline?: Pre-admission baseline Bathing: Needs assistance Is this a change from baseline?: Pre-admission baseline Toileting: Needs assistance Is this a change from baseline?: Pre-admission baseline In/Out Bed: Needs assistance Is this a change from baseline?: Pre-admission baseline Walks in Home: Needs assistance Is this a change from baseline?: Pre-admission baseline Does the patient have difficulty walking or climbing stairs?: Yes Weakness of Legs: Both Weakness of Arms/Hands: Both  Permission Sought/Granted                  Emotional Assessment Appearance:: Appears stated age     Orientation: : Oriented to Self, Oriented to Place, Oriented to  Time, Oriented to Situation   Psych Involvement: No (comment)  Admission diagnosis:  Pneumoperitoneum [K66.8] ALS (amyotrophic lateral sclerosis) ([place]) [G12.21] Patient Active Problem List   Diagnosis Date Noted  . Pneumoperitoneum 03/30/2020  . Migraines 03/30/2020  . Weight loss, unintentional 03/30/2020  . Duodenal bulb ulcer with perforation s/p omental Phillip Heal patch 03/30/2020 03/30/2020  . 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 Abdominal pain 03/01/2020  . Small bowel obstruction ([place]) 12/06/2019  . Dry mouth 08/09/2018  . Collagenous colitis 07/28/2018  . Decreased sensation of lower extremity 07/20/2018  . ALS (amyotrophic lateral sclerosis) ([place]) 12/23/2017  .  Muscle weakness (generalized) 11/23/2017  . Bilateral arm weakness 11/11/2017  . Elevated blood pressure reading without diagnosis of hypertension 11/11/2017  . Post herpetic neuralgia 11/11/2017  . MDS (myelodysplastic syndrome)  ([place]) 01/22/2015   PCP:  Shirline Frees, MD Pharmacy:   [facility] [place], [place] [address] Phone: [phone] Fax: [phone]  [employer], [place] [address] Phone: [phone] Fax: [phone]     Social Determinants of Health (SDOH) Interventions    Readmission Risk Interventions No flowsheet data found.

## 2020-04-01 NOTE — Progress Notes (Signed)
Daily Progress Note   Patient Name: Lori Alvarez       Date: 04/01/2020 DOB: 12/05/1960  Age: 59 y.o. MRN#: [id number] Attending Physician: Patrecia Pour, MD Primary Care Physician: Shirline Frees, MD Admit Date: 03/30/2020  Reason for Consultation/Follow-up: Establishing goals of care, Non pain symptom management and Pain control  Subjective: Awake alert resting in bed.  Is having some flatus, tried to participate some with PT/OT who she states try to get her to stand up with assist.  Nausea is a little bit better pain is a little bit better controlled today.  Has NG tube.  Husband present at bedside.  Patient states she has been told she is transferring out of stepdown today.   Length of Stay: 2  Current Medications: Scheduled Meds:   Chlorhexidine Gluconate Cloth  6 each Topical Daily   enoxaparin (LOVENOX) injection  40 mg Subcutaneous Q24H   lip balm  1 application Topical BID   mupirocin ointment  1 application Nasal BID   ondansetron (ZOFRAN) IV  4 mg Intravenous Q8H   [START ON 04/03/2020] pantoprazole  40 mg Intravenous Q12H    Continuous Infusions:  acetaminophen Stopped (04/01/20 1156)   dextrose 5% lactated ringers with KCl 20 mEq/L 100 mL/hr at 04/01/20 1227   lactated ringers     methocarbamol (ROBAXIN) IV Stopped (04/01/20 0738)   pantoprozole (PROTONIX) infusion 8 mg/hr (04/01/20 1227)   piperacillin-tazobactam (ZOSYN)  IV Stopped (04/01/20 1111)   piperacillin-tazobactam     sodium phosphate  Dextrose 5% IVPB 30 mmol (04/01/20 1227)    PRN Meds: acetaminophen, alum & mag hydroxide-simeth, bisacodyl, fentaNYL (SUBLIMAZE) injection, guaiFENesin-dextromethorphan, hydrocortisone, hydrocortisone cream, lactated ringers, LORazepam, magic mouthwash,  menthol-cetylpyridinium, metoprolol tartrate, morphine injection, ondansetron (ZOFRAN) IV, phenol, promethazine  Physical Exam         Awake alert resting in bed No distress Regular work of breathing Tachycardia in the low 100s on the monitor Abdomen not distended Does not have edema Has wound VAC for midline wound  Vital Signs: BP (!) 153/93    Pulse (!) 126    Temp 99.8 F (37.7 C) (Axillary)    Resp (!) 27    Ht 5' 2.99" (1.6 m)    Wt 53.5 kg    SpO2 99%    BMI 20.91 kg/m  SpO2:  SpO2: 99 % O2 Device: O2 Device: Room Air O2 Flow Rate: O2 Flow Rate (L/min): 2 L/min  Intake/output summary:   Intake/Output Summary (Last 24 hours) at 04/01/2020 1500 Last data filed at 04/01/2020 1425 Gross per 24 hour  Intake 2862.19 ml  Output 1510 ml  Net 1352.19 ml   LBM: Last BM Date:  (PTA) Baseline Weight: Weight: 53.5 kg Most recent weight: Weight: 53.5 kg       Palliative Assessment/Data:      Patient Active Problem List   Diagnosis Date Noted   Pneumoperitoneum 03/30/2020   Migraines 03/30/2020   Weight loss, unintentional 03/30/2020   Duodenal bulb ulcer with perforation s/p omental Graham patch 03/30/2020 03/30/2020   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 female  with past medical history of  ALS, follows at [facility], also with history of MDS, SBO,  Appendectomy,collagenous colitis, partialHysterectomy, postherpetic neuralgia.Generalized muscle weakness admitted on 03/30/2020 with pneumoperitoneum SBO.     Patient lives at home with husband, admitted with acute severe chest and abdominal pain, CT with free air with gas and duodenal bulb suspicious for perforation, dilated small bowel with probable obstruction. She underwent emergency surgery with OR findings of massive pneumoperitoneum, duodenal perforation at bulb, dilated small bowel with stricture, possible source of chronic obstruction.  A palliative consult has been requested for ongoing support and additional palliative care.    Recommendations/Plan:  Medication history noted.  Patient received 5 doses of 12.5 mcg of fentanyl IV, also remains on IV Tylenol.  She is on scheduled Zofran.  Continue current pain and nonpain symptom management.  Palliative will follow along.  Recommend home-based palliative care once patient is able to be considered for discharge back home under the care of her husband who is her primary caregiver.   Code Status: Code Status History    Date Active Date Inactive Code Status Order ID Comments User Context   03/01/2020 2231 03/03/2020 1938 DNR [id number]  Rise Patience, MD ED   12/06/2019 1815 12/09/2019 2224 DNR [id number]  Jennye Boroughs, RN Inpatient   12/06/2019 1710 12/06/2019 1815 Full Code [id number]  Clovis Riley, MD Inpatient   Advance Care Planning Activity    Questions for Most Recent Historical Code Status (Order [id number])    Question Answer   In the event of cardiac or respiratory ARREST Do not call a code blue   In the event of cardiac or respiratory ARREST Do not perform Intubation, CPR, defibrillation or ACLS   In the event of cardiac or respiratory ARREST Use medication by any route, position, wound care, and other measures to relive pain and suffering. May use oxygen, suction and manual treatment of airway obstruction as  needed for comfort.        Advance Directive Documentation     Most Recent Value  Type of Advance Directive Out of facility DNR (pink MOST or yellow form)  Pre-existing out of facility DNR  order (yellow form or pink MOST form) --  "MOST" Form in Place? --       Prognosis:   Unable to determine  Discharge Planning:  Home with Palliative Services  Care plan was discussed with patient and husband.  Briefly discussed with bedside RN.  Thank you for allowing the Palliative Medicine Team to assist in the care of this patient.   Time In: 1430 Time Out: 1500 Total Time 30 Prolonged Time Billed  no       Greater than 50%  of this time was spent counseling and coordinating care related to the above assessment and plan.  Loistine Chance, MD  Please contact Palliative Medicine Team phone at [phone] for questions and concerns.

## 2020-04-01 NOTE — Progress Notes (Signed)
Pt beginning to experience increasing confusion and HR sustaining in the 130s. On call provider paged via [place].

## 2020-04-02 DIAGNOSIS — R110 Nausea: Secondary | ICD-10-CM

## 2020-04-02 DIAGNOSIS — R109 Unspecified abdominal pain: Secondary | ICD-10-CM

## 2020-04-02 LAB — GLUCOSE, CAPILLARY
Glucose-Capillary: 144 mg/dL — ABNORMAL HIGH (ref 70–99)
Glucose-Capillary: 140 mg/dL — ABNORMAL HIGH (ref 70–99)

## 2020-04-02 LAB — SURGICAL PATHOLOGY

## 2020-04-02 LAB — AMYLASE, BODY FLUID (OTHER): Amylase, Body Fluid: 124 U/L

## 2020-04-02 MED ORDER — ALPRAZOLAM 0.25 MG PO TABS: 0.2500 mg | ORAL_TABLET | Freq: Three times a day (TID) | ORAL | Status: DC | PRN

## 2020-04-02 MED ORDER — GLYCOPYRROLATE 1 MG PO TABS
1.0000 mg | ORAL_TABLET | ORAL | Status: DC | PRN
  Filled 2020-04-02: qty 1

## 2020-04-02 MED ORDER — GLYCOPYRROLATE 0.2 MG/ML IJ SOLN
0.2000 mg | INTRAMUSCULAR | Status: DC | PRN
  Filled 2020-04-02: qty 1

## 2020-04-02 MED ORDER — PROCHLORPERAZINE EDISYLATE 10 MG/2ML IJ SOLN
10.0000 mg | Freq: Four times a day (QID) | INTRAMUSCULAR | Status: DC | PRN
  Administered 2020-04-02: 10 mg via INTRAVENOUS
  Filled 2020-04-02: qty 2

## 2020-04-02 MED ORDER — FENTANYL CITRATE (PF) 100 MCG/2ML IJ SOLN
12.5000 ug | INTRAMUSCULAR | Status: DC | PRN
  Administered 2020-04-02: 12.5 ug via INTRAVENOUS
  Filled 2020-04-02: qty 2

## 2020-04-02 MED ORDER — POLYVINYL ALCOHOL 1.4 % OP SOLN
1.0000 [drp] | Freq: Four times a day (QID) | OPHTHALMIC | Status: DC | PRN
  Filled 2020-04-02: qty 15

## 2020-04-02 MED ORDER — PROCHLORPERAZINE EDISYLATE 10 MG/2ML IJ SOLN: 10.0000 mg | Freq: Four times a day (QID) | INTRAMUSCULAR | Status: AC | PRN

## 2020-04-02 MED ORDER — ACETAMINOPHEN 10 MG/ML IV SOLN: 1000.0000 mg | Freq: Four times a day (QID) | INTRAVENOUS | Status: DC

## 2020-04-02 MED ORDER — BIOTENE DRY MOUTH MT LIQD: 15.0000 mL | OROMUCOSAL | Status: DC | PRN

## 2020-04-02 MED ORDER — FENTANYL CITRATE (PF) 100 MCG/2ML IJ SOLN: 12.5000 ug | INTRAMUSCULAR | 0 refills | Status: AC | PRN

## 2020-04-02 NOTE — Discharge Instructions (Signed)
Change dressing daily with a normal saline wet to dry dressing to keep wound moist. Routine JP drain care and emptying.  This may be removed per patient's preference if she changes her mind

## 2020-04-02 NOTE — Progress Notes (Signed)
Physical Therapy Discharge Patient Details Name: Lori Alvarez MRN: [id number] DOB: 1961-06-11 Today's Date: 04/02/2020 Time:  -     Patient discharged from PT services secondary to medical decline -   Please see latest therapy progress note for current level of functioning and progress toward goals.    GP     Claretha Cooper 04/02/2020, 9:20 AM [place] Pager [phone] Office [phone]

## 2020-04-02 NOTE — Progress Notes (Signed)
Patient ID: Lori Alvarez, female   DOB: 1961-03-31, 59 y.o.   MRN: [id number]    3 Days Post-Op  Subjective: Patient has decided on full comfort care.  Looking into bed at [facility] or [facility] in [place].  She would like her NGT out.  Anything to minimize her pain and discomfort  ROS: See above, otherwise other systems negative  Objective: Vital signs in last 24 hours: Temp:  [97.2 F (36.2 C)-99.8 F (37.7 C)] 98.2 F (36.8 C) (06/15 0617) Pulse Rate:  [113-146] 146 (06/15 0617) Resp:  [16-37] 20 (06/15 0617) BP: (134-182)/(70-102) 182/102 (06/15 0617) SpO2:  [96 %-100 %] 97 % (06/15 0617) Last BM Date:  (PTA)  Intake/Output from previous day: 06/14 0701 - 06/15 0700 In: 1723.2 [I.V.:1260; IV Piggyback:463.2] Out: 1870 [Urine:1150; Emesis/NG output:700; Drains:20] Intake/Output this shift: No intake/output data recorded.  PE: Abd: NGT removed.  JP drain in place with serosang output.  Midline wound VAC in place  Lab Results:  Recent Labs    03/31/20 0227 04/01/20 0326  WBC 12.0* 13.2*  HGB 10.5* 10.5*  HCT 32.8* 33.3*  PLT 392 414*   BMET Recent Labs    03/31/20 0647 04/01/20 0326  NA 138 141  K 4.1 3.7  CL 106 106  CO2 20* 20*  GLUCOSE 108* 75  BUN 7 9  CREATININE 0.37* 0.45  CALCIUM 8.4* 9.2   PT/INR No results for input(s): LABPROT, INR in the last 72 hours. CMP     Component Value Date/Time   NA 141 04/01/2020 0326   K 3.7 04/01/2020 0326   CL 106 04/01/2020 0326   CO2 20 (L) 04/01/2020 0326   GLUCOSE 75 04/01/2020 0326   BUN 9 04/01/2020 0326   CREATININE 0.45 04/01/2020 0326   CALCIUM 9.2 04/01/2020 0326   PROT 6.4 (L) 04/01/2020 0326   ALBUMIN 3.5 04/01/2020 0326   AST 22 04/01/2020 0326   ALT 13 04/01/2020 0326   ALKPHOS 61 04/01/2020 0326   BILITOT 1.4 (H) 04/01/2020 0326   GFRNONAA >60 04/01/2020 0326   GFRAA >60 04/01/2020 0326   Lipase     Component Value Date/Time   LIPASE 113 (H) 03/30/2020 1109        Studies/Results: No results found.  Anti-infectives: Anti-infectives (From admission, onward)   Start     Dose/Rate Route Frequency Ordered Stop   04/01/20 1430  piperacillin-tazobactam (ZOSYN) IVPB 3.375 g        3.375 g 100 mL/hr over 30 Minutes Intravenous  Once 04/01/20 1428 04/01/20 1533   03/31/20 0800  piperacillin-tazobactam (ZOSYN) IVPB 3.375 g  Status:  Discontinued        3.375 g 12.5 mL/hr over 240 Minutes Intravenous Every 8 hours 03/31/20 0758 04/02/20 0936   03/30/20 1751  clindamycin (CLEOCIN) 900 mg, gentamicin (GARAMYCIN) 240 mg in sodium chloride 0.9 % 1,000 mL for intraperitoneal lavage  Status:  Discontinued          As needed 03/30/20 1752 03/30/20 1936   03/30/20 1445  clindamycin (CLEOCIN) IVPB 900 mg  Status:  Discontinued       "And" Linked Group Details   900 mg 100 mL/hr over 30 Minutes Intravenous On call to O.R. 03/30/20 1427 03/31/20 0559   03/30/20 1445  gentamicin (GARAMYCIN) 270 mg in dextrose 5 % 100 mL IVPB  Status:  Discontinued       "And" Linked Group Details   5 mg/kg  53.5 kg 106.8 mL/hr over 60  Minutes Intravenous On call to O.R. 03/30/20 1427 03/31/20 0559   03/30/20 1430  clindamycin (CLEOCIN) 900 mg, gentamicin (GARAMYCIN) 240 mg in sodium chloride 0.9 % 1,000 mL for intraperitoneal lavage  Status:  Discontinued       Note to Pharmacy: Have in the  [place] room for final irrigation in bowel surgery case to minimize risk of abscess/infection Pharmacy may adjust dosing strength, schedule, rate of infusion, etc as needed to optimize therapy    Irrigation To Surgery 03/30/20 1427 03/31/20 0758   03/30/20 1330  piperacillin-tazobactam (ZOSYN) IVPB 3.375 g        3.375 g 12.5 mL/hr over 240 Minutes Intravenous  Once 03/30/20 1324 03/30/20 1623       Assessment/Plan ALS  DUODENAL PERFORATION (PROBABLE ULCER) ILEAL STRICTURE WITH SMALL BOWEL OBSTRUCTION S/p EXPLORATORY LAPAROTOMY, OMENTAL GRAHAM PATCH, ILEAL RESECTION - Dr. Johney Maine,  03/30/2020 - POD #3 - transition to hospice care, can DC abx as per PCM/primary service. - PPI, per primary/PCM.  - will leave JP drain at this time so if she does develop a leak it will hopefully be controlled out of her drain and cause less pain.  Patient would also like to avoid the pain of removing it as it is not bothering her currently.  Will leave this in place and she may go with it.  This may be removed at any point in time per her preference. - NGT removed today.  Diet as she tolerates from palliative approach - Dc wound VAC and change to NS WD dressing change daily   FEN - per PCM, likely to start with clears VTE - SCDs, okay for chemical prophylaxis from our standpoint ID - Zosyn, likely to stop  Foley - purewick Follow-up - no surgical follow up.  Husband and best friend were in the room today with Dr. Bonner Puna, Dr. Domingo Cocking, and myself   LOS: 3 days    Henreitta Cea , [facility] Surgery 04/02/2020, 9:51 AM Please see Amion for pager number during day hours 7:00am-4:30pm or 7:00am -11:30am on weekends

## 2020-04-02 NOTE — TOC Transition Note (Signed)
Transition of Care [facility]) - CM/SW Discharge Note   Patient Details  Name: Lori Alvarez MRN: [id number] Date of Birth: [birth year]  Transition of Care [facility]) CM/SW Contact:  Ross Ludwig, LCSW Phone Number: 04/02/2020, 12:12 PM   Clinical Narrative:     CSW was informed that [place] can accept patient today.  Patient to be d/c'ed today to [place].  Patient and family agreeable to plans will transport via ems RN to call report [phone].  Patient's husband was notified about patient discharging today.  Final next level of care: [place] Barriers to Discharge: Barriers Resolved   Patient Goals and CMS Choice Patient states their goals for this hospitalization and ongoing recovery are:: To go to [facility] for hospice care. CMS Medicare.gov Compare Post Acute Care list provided to:: Patient Represenative (must comment) Choice offered to / list presented to : Spouse  Discharge Placement  [facility]            Patient chooses bed at: Other - please specify in the comment section below: ([facility]) Patient to be transferred to facility by: [facility] Name of family member notified: Patient's husband Scientist, physiological Patient and family notified of of transfer: 04/02/20  Discharge Plan and Services   Discharge Planning Services: CM Consult              DME Agency: NA       HH Arranged: NA          Social Determinants of Health (SDOH) Interventions     Readmission Risk Interventions No flowsheet data found.

## 2020-04-02 NOTE — Progress Notes (Signed)
Pt wet.  Cleaned pt and put on new purewick.  Dr. Bonner Puna here talking with family at their request

## 2020-04-02 NOTE — Discharge Summary (Signed)
Physician Discharge Summary  Lori Alvarez NLG:[id number] DOB: 1961/03/22 DOA: 03/30/2020  PCP: Shirline Frees, MD  Admit date: 03/30/2020 Discharge date: 04/02/2020  Admitted From: Home Disposition: Residential Hospice   Recommendations for Outpatient Follow-up:  1. Comfort measures 2. Daily W > D dressing changes of abdominal surgical wound  Home Health: N/A Equipment/Devices: JP drain Discharge Condition: Stable for transfer CODE STATUS: DNR Diet recommendation: Comfort feeds ok, though NPO would be medical recommendation.  Brief/Interim Summary: Lori Alvarez is a 59 y.o. female with a history of ALS, hysterectomy, appendectomy, recurrent SBO who presented to the ED with lower chest, upper abdominal pain. No significant cardiac or pulmonary etiologies found, though CT with free air with gas and duodenal bulb suspicious for perforation, dilated small bowel with probable obstruction. She underwent emergency surgery with OR findings of massive pneumoperitoneum, duodenal perforation at bulb, purulent peritonitis, dilated small bowel with stricture, possible source of chronic obstruction. This was treated with omental Phillip Heal patch and ileal resection with side-to-side anastomosis. Weaning off vent was surprisingly uncomplicated and she was treated in ICU with palliative care consulting until 6/14-6/15 when the patient requested transition to comfort measures. She will be transferred to hospice.  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 No surgical complications noted per se, though pt's baseline poor functional status has worsened dramatically complicated by severe pain. We will stop all medications/interventions not aimed at comfort. Surgery removed NGT though she had output and may develop distention, we recommend treating that with parenteral opioids and antiemetics. Continue wet to dry dressing changes daily, wound vac DC'ed prior to transfer to hospice. Will continue JP drain as this is not causing discomfort and removal may cause discomfort.   Sinus tachycardia: Physiologic response to inflammatory state/fever.  - Would continue tylenol  Hypophosphatemia:  - No further interventions planned based on goals of care discussions.   Recurrent/chronic SBO, ileal stricture: s/p ileal resection and side-to-side anastomosis.   - See above  ALS: Dx August 2017, first had left hand weakness, followed by ALS clinic, Dr. Brynda Rim. - Recommend continuing antispasmodic by IV if needed.  - Restart lyrica, duloxetine, and hyoscyamine if able to take po  Anxiety:  - Pt preference to continue xanax home med  Vitamin B12 deficiency:  - - No further interventions planned based on goals of care discussions.  Discharge Instructions  Allergies as of 04/02/2020      Reactions   Nsaids Other (See Comments)   PERFORATED ULCER = NO NSAIDS   Allegra [fexofenadine]    Ringing in ears    Clotrim-undecyl-tea Tr-lav Oil    unknown   Doxepin    Headaches   Nitroglycerin    Migraine   Keflex [cephalexin]    "too strong, made me drowsy and dizzy"   Nitrofuran Derivatives Rash   Sulfa Antibiotics Rash      Medication List  STOP taking these medications   polycarbophil 625 MG tablet Commonly known as: FIBERCON   Remifemin 20  MG Tabs Generic drug: Black Cohosh   VITAMIN B-12 PO     TAKE these medications   acetaminophen 500 MG tablet Commonly known as: TYLENOL Take 500 mg by mouth in the morning, at noon, in the evening, and at bedtime.   ALPRAZolam 0.5 MG tablet Commonly known as: XANAX Take 0.25-0.5 mg by mouth 2 (two) times daily. Take 1/2 tablet (0.25 mg) in the morning and Take 1 tablet (0.5 mg) at bedtime   baclofen 10 MG tablet Commonly known as: LIORESAL Take 10 mg by mouth 4 (four) times daily.   DULoxetine 20 MG capsule Commonly known as: CYMBALTA Take 20 mg by mouth every evening.   fentaNYL 100 MCG/2ML injection Commonly known as: SUBLIMAZE Inject 0.25 mLs (12.5 mcg total) into the vein every hour as needed for moderate pain.   hyoscyamine 0.375 MG 12 hr tablet Commonly known as: LEVBID Take 0.375 mg by mouth every 12 (twelve) hours.   melatonin 5 MG Tabs Take 5 mg by mouth at bedtime.   pregabalin 50 MG capsule Commonly known as: LYRICA Take 50-100 mg by mouth See admin instructions. Take 100 mg (2 capsules) at 10 am, take 50 mg (1 capsules) at 2 pm, take 100 mg (2 capsules) at 6 pm, and take 50 mg (1 capsule) at 10 pm.   prochlorperazine 10 MG/2ML injection Commonly known as: COMPAZINE Inject 2 mLs (10 mg total) into the vein every 6 (six) hours as needed.       Follow-up Information    Shirline Frees, MD Follow up.   Specialty: Family Medicine Contact information: [place] [address] [phone]              Allergies  Allergen Reactions  . Nsaids Other (See Comments)    PERFORATED ULCER = NO NSAIDS  . Allegra [Fexofenadine]     Ringing in ears   . Clotrim-Undecyl-Tea Tr-Lav Oil     unknown  . Doxepin     Headaches  . Nitroglycerin     Migraine  . Keflex [Cephalexin]     "too strong, made me drowsy and dizzy"  . Nitrofuran Derivatives Rash  . Sulfa Antibiotics Rash    Consultations:  General surgery  Palliative  care  Procedures/Studies: DG Abdomen Acute W/Chest  Result Date: 03/30/2020 CLINICAL DATA:  Epigastric pain radiating to right side. History of ALS. EXAM: DG ABDOMEN ACUTE W/ 1V CHEST COMPARISON:  Chest x-ray 09/05/2019 and abdominal films 03/03/2020 FINDINGS: Patient is slightly rotated to the right. Lungs are hypoinflated but otherwise clear. Cardiomediastinal silhouette and remainder of the chest is unremarkable. Abdominopelvic images demonstrate several air-filled dilated central small bowel loops measuring up to 4.2 cm in diameter. Mild amount of air and stool throughout the colon. Mild gastric distension. No free peritoneal air. Remainder the exam is unchanged. IMPRESSION: 1. Several dilated air-filled central small bowel loops with mild air throughout the colon. Findings may be seen and ileus versus early/partial small bowel obstruction. 2.  Hypoinflation without acute cardiopulmonary disease. Electronically Signed   By: Marin Olp M.D.   On: 03/30/2020 12:52   CT Angio Chest/Abd/Pel for Dissection W and/or Wo Contrast  Result Date: 03/30/2020 CLINICAL DATA:  Epigastric pain radiating into right abdomen and back. History of ALS. EXAM: CT ANGIOGRAPHY CHEST, ABDOMEN AND PELVIS TECHNIQUE: Non-contrast CT of the chest was initially obtained. Multidetector  CT imaging through the chest, abdomen and pelvis was performed using the standard protocol during bolus administration of intravenous contrast. Multiplanar reconstructed images and MIPs were obtained and reviewed to evaluate the vascular anatomy. CONTRAST:  14mL OMNIPAQUE IOHEXOL 350 MG/ML SOLN COMPARISON:  CT of the abdomen and pelvis on 03/01/2020 FINDINGS: CTA CHEST FINDINGS Cardiovascular: The thoracic aorta is normal in caliber and demonstrates no evidence of dissection. There is calcified plaque at the level of the aortic arch. Visualized proximal great vessels are normally patent. Pulmonary arteries are also well opacified and demonstrate  normal patency and normal caliber. The heart size is normal. No pericardial fluid identified. No significant calcified coronary artery plaque identified. Mediastinum/Nodes: No enlarged mediastinal, hilar, or axillary lymph nodes. Thyroid gland, trachea, and esophagus demonstrate no significant findings. Lungs/Pleura: Bibasilar atelectasis present. There is a trace amount pleural fluid bilaterally, right greater than left. There is no evidence of pulmonary edema, focal airspace consolidation, pneumothorax, nodule or pleural fluid. Musculoskeletal: No chest wall abnormality. No acute or significant osseous findings. Review of the MIP images confirms the above findings. CTA ABDOMEN AND PELVIS FINDINGS VASCULAR Aorta: Calcified plaque in the abdominal aorta without evidence of aneurysmal disease or dissection. Celiac: Normally patent.  Normal branch vessel patency. SMA: Normally patent. Renals: Two separate left and two separate right renal arteries demonstrate normal patency. The lower pole artery on the right emanates off of the distal aorta nearly at the aortic bifurcation. IMA: Normally patent. Inflow: Normally patent bilateral iliac arteries and common femoral arteries. Review of the MIP images confirms the above findings. NON-VASCULAR Hepatobiliary: No focal liver abnormality is seen. No gallstones, gallbladder wall thickening, or biliary dilatation. Pancreas: Unremarkable. No pancreatic ductal dilatation or surrounding inflammatory changes. Spleen: Normal in size without focal abnormality. Adrenals/Urinary Tract: Adrenal glands are unremarkable. Kidneys are normal, without renal calculi, focal lesion, or hydronephrosis. Bladder is unremarkable. Stomach/Bowel: Moderate free intraperitoneal air is present in the peritoneal cavity. The stomach is distended with air and there is some potential inflammation and thickening of the proximal duodenum. At the level of the mid and lower abdomen, there are multiple dilated  small bowel loops reaching maximal caliber of approximately 4 cm. The dilated loops are inflamed in appearance and hyperemic. One transversely oriented mid small bowel loop appears thickened and there is a layer of free intraperitoneal air anterior to this bowel loop. Lymphatic: No enlarged lymph nodes identified. Reproductive: Status post hysterectomy. No adnexal masses. Other: Small amount of free fluid in the pelvis and interspersed between some lower dilated small bowel loops. No focal abscess. Musculoskeletal: No acute or significant osseous findings. Review of the MIP images confirms the above findings. IMPRESSION: 1. No evidence of aortic dissection. 2. Moderate free intraperitoneal air in the peritoneal cavity. At the level of the mid and lower abdomen, there are multiple dilated small bowel loops reaching maximal caliber of approximately 4 cm. The dilated loops are inflamed in appearance and hyperemic. One of the mid small bowel loops appears decompressed and thickened and there is a layer of free intraperitoneal air anterior to this bowel loop. Findings are consistent with a partial small bowel obstruction/ileus and small bowel perforation may be the source of free air. 3. Possible inflammation and thickening of the proximal duodenum. Perforated duodenal ulcer also cannot be excluded as a potential source for free intraperitoneal air. 4. Trace amount of pleural fluid bilaterally, right greater than left. 5. Small amount of free fluid in the abdomen and pelvis. These results were  called by telephone at the time of interpretation on 03/30/2020 at 1:25 PM to provider [facility] , who verbally acknowledged these results. Electronically Signed   By: Aletta Edouard M.D.   On: 03/30/2020 13:34    Subjective: Severe pain/discomfort, no longer wishes to attempt rehabilitation and opts for comfort measures only. Family members at bedside in agreement. This has been considered for a long time.  Discharge  Exam: Vitals:   04/02/20 0304 04/02/20 0617  BP: (!) 178/80 (!) 182/102  Pulse: (!) 124 (!) 146  Resp: 16 20  Temp: (!) 97.2 F (36.2 C) 98.2 F (36.8 C)  SpO2: 100% 97%   General: No acute distress Cardiovascular: Regular tachycardia, no edema Respiratory: Nonlabored, clear Abdominal: Soft, appropriately tender, ND, bowel sounds +  Labs: BNP (last 3 results) No results for input(s): BNP in the last 8760 hours. Basic Metabolic Panel: Recent Labs  Lab 03/30/20 1109 03/31/20 0647 04/01/20 0326  NA 141 138 141  K 4.4 4.1 3.7  CL 106 106 106  CO2 25 20* 20*  GLUCOSE 124* 108* 75  BUN 13 7 9   CREATININE 0.45 0.37* 0.45  CALCIUM 8.8* 8.4* 9.2  MG  --  1.7 2.1  PHOS  --  2.5 1.4*   Liver Function Tests: Recent Labs  Lab 03/30/20 1109 03/31/20 0647 04/01/20 0326  AST 17 19 22   ALT 12 14 13   ALKPHOS 75 46 61  BILITOT 0.6 0.6 1.4*  PROT 6.4* 6.1* 6.4*  ALBUMIN 3.5 3.5 3.5   Recent Labs  Lab 03/30/20 1109 04/01/20 0326  LIPASE 113*  --   AMYLASE  --  57   No results for input(s): AMMONIA in the last 168 hours. CBC: Recent Labs  Lab 03/30/20 1109 03/31/20 0227 04/01/20 0326  WBC 12.8* 12.0* 13.2*  NEUTROABS 11.3* 11.0* 11.5*  HGB 12.2 10.5* 10.5*  HCT 39.4 32.8* 33.3*  MCV 92.5 89.4 92.0  PLT 436* 392 414*   Cardiac Enzymes: No results for input(s): CKTOTAL, CKMB, CKMBINDEX, TROPONINI in the last 168 hours. BNP: Invalid input(s): POCBNP CBG: Recent Labs  Lab 04/01/20 0650 04/01/20 2030 04/01/20 2340 04/02/20 0303 04/02/20 0801  GLUCAP 88 121* 118* 144* 140*   D-Dimer No results for input(s): DDIMER in the last 72 hours. Hgb A1c No results for input(s): HGBA1C in the last 72 hours. Lipid Profile No results for input(s): CHOL, HDL, LDLCALC, TRIG, CHOLHDL, LDLDIRECT in the last 72 hours. Thyroid function studies No results for input(s): TSH, T4TOTAL, T3FREE, THYROIDAB in the last 72 hours.  Invalid input(s): FREET3 Anemia work up No  results for input(s): VITAMINB12, FOLATE, FERRITIN, TIBC, IRON, RETICCTPCT in the last 72 hours. Urinalysis    Component Value Date/Time   COLORURINE STRAW (A) 03/01/2020 1619   APPEARANCEUR CLEAR 03/01/2020 1619   LABSPEC 1.004 (L) 03/01/2020 1619   PHURINE 6.0 03/01/2020 1619   GLUCOSEU NEGATIVE 03/01/2020 1619   HGBUR NEGATIVE 03/01/2020 1619   BILIRUBINUR NEGATIVE 03/01/2020 1619   KETONESUR 20 (A) 03/01/2020 1619   PROTEINUR NEGATIVE 03/01/2020 1619   NITRITE NEGATIVE 03/01/2020 [place] 03/01/2020 1619    Microbiology Recent Results (from the past 240 hour(s))  Blood culture (routine x 2)     Status: None (Preliminary result)   Collection Time: 03/30/20  1:48 PM   Specimen: BLOOD LEFT WRIST  Result Value Ref Range Status   Specimen Description BLOOD LEFT WRIST  Final   Special Requests   Final  BOTTLES DRAWN AEROBIC AND ANAEROBIC Blood Culture results may not be optimal due to an inadequate volume of blood received in culture bottles   Culture   Final    NO GROWTH 3 DAYS Performed at [facility], [place] [address] [place], [place] [address]    Report Status PENDING  Incomplete  SARS Coronavirus 2 by RT PCR (hospital order, performed in [facility] hospital lab) Nasopharyngeal Nasopharyngeal Swab     Status: None   Collection Time: 03/30/20  2:36 PM   Specimen: Nasopharyngeal Swab  Result Value Ref Range Status   SARS Coronavirus 2 NEGATIVE NEGATIVE Final    Comment: (NOTE) SARS-CoV-2 target nucleic acids are NOT DETECTED.  The SARS-CoV-2 RNA is generally detectable in upper and lower respiratory specimens during the acute phase of infection. The lowest concentration of SARS-CoV-2 viral copies this assay can detect is 250 copies / mL. A negative result does not preclude SARS-CoV-2 infection and should not be used as the sole basis for treatment or other patient management decisions.  A negative result may occur with improper specimen  collection / handling, submission of specimen other than nasopharyngeal swab, presence of viral mutation(s) within the areas targeted by this assay, and inadequate number of viral copies (<250 copies / mL). A negative result must be combined with clinical observations, patient history, and epidemiological information.  Fact Sheet for Patients:   StrictlyIdeas.no  Fact Sheet for Healthcare Providers: BankingDealers.co.za  This test is not yet approved or  cleared by the [place] FDA and has been authorized for detection and/or diagnosis of SARS-CoV-2 by FDA under an Emergency Use Authorization (EUA).  This EUA will remain in effect (meaning this test can be used) for the duration of the COVID-19 declaration under Section 564(b)(1) of the Act, 21 U.S.C. section 360bbb-3(b)(1), unless the authorization is terminated or revoked sooner.  Performed at [facility], [place] [address], [place], [place] [address]   Surgical PCR screen     Status: None   Collection Time: 03/30/20  3:01 PM   Specimen: Nasal Mucosa; Nasal Swab  Result Value Ref Range Status   MRSA, PCR NEGATIVE NEGATIVE Final   Staphylococcus aureus NEGATIVE NEGATIVE Final    Comment: (NOTE) The Xpert SA Assay (FDA approved for NASAL specimens in patients 76 years of age and older), is one component of a comprehensive surveillance program. It is not intended to diagnose infection nor to guide or monitor treatment. Performed at [facility], [place] [address], [place], [place] [address]     Time coordinating discharge: Approximately 40 minutes  Patrecia Pour, MD  [facility] 04/02/2020, 11:47 AM

## 2020-04-02 NOTE — Progress Notes (Signed)
OT Cancellation Note  Patient Details Name: QUANIKA SOLEM MRN: [id number] DOB: 03-20-1961   Cancelled Treatment:    Reason Eval/Treat Not Completed: Other (comment) MD has discontinued OT services. Patient pursuing hospice services. Will need re-ordered if POC and GOC change.  Lucy Boardman L Jentri Aye 04/02/2020, 10:56 AM

## 2020-04-02 NOTE — Progress Notes (Signed)
Daily Progress Note   Patient Name: Lori Alvarez       Date: 04/02/2020 DOB: [birth year]  Age: 59 y.o. MRN#: [id number] Attending Physician: Patrecia Pour, MD Primary Care Physician: Shirline Frees, MD Admit Date: 03/30/2020  Reason for Consultation/Follow-up: Establishing goals of care, Non pain symptom management and Pain control  Subjective: Received call from Dr. Bonner Puna that Ms. Pfluger has stated that she is tired and wants to focus on comfort and get out of the hospital residential hospice.  I saw and examined Ms. Boggess this morning.  Dr. Bonner Puna and Saverio Danker from [facility] present at the bedside as well.  Discussed goals for Ms. Camus moving forward.  At this point, she wants only to have good symptom management and focus on comfort as she approaches end-of-life.  She is familiar with residential hospice and states that she wishes to transition to residential hospice for end-of-life care.  During my encounter, surgery removed NG tube and will be working to remove wound VAC.  Plan is for wet-to-dry dressings moving forward.  Discussed symptom management and she reports that fentanyl does seem to cut pain from 10 out of 10 to 5 out of 10.  Discussed either increasing frequency, strength, or both.  She would like to keep same dose of fentanyl but have it available more frequently if needed.  She has tried multiple other opioids and she does not tolerate.  I think she will need to stay on IV fentanyl at discharge to residential hospice.  Discussed nausea and she reports Zofran seems to be fairly effective.  She did not like getting Phenergan.  Will DC Phenergan and add as needed Compazine to be used if Zofran is ineffective.  For anxiety, she reports that she prefers Xanax to Ativan.  DC  Ativan.  We will add on Xanax as needed.   Length of Stay: 3  Current Medications: Scheduled Meds:  . lip balm  1 application Topical BID  . ondansetron (ZOFRAN) IV  4 mg Intravenous Q8H  . pantoprazole (PROTONIX) IV  40 mg Intravenous Q12H    Continuous Infusions: . acetaminophen      PRN Meds: ALPRAZolam, antiseptic oral rinse, bisacodyl, fentaNYL (SUBLIMAZE) injection, glycopyrrolate **OR** glycopyrrolate **OR** glycopyrrolate, hydrocortisone, hydrocortisone cream, ondansetron (ZOFRAN) IV, phenol, polyvinyl alcohol, prochlorperazine  Physical Exam  Awake, alert, resting in bed.  No distress. Regular work of breathing Tachycardic Abdomen nondistended with wound VAC in place No significant edema NG tube removed during encounter by surgical team  Vital Signs: BP (!) 182/102 (BP Location: Right Leg)   Pulse (!) 146   Temp 98.2 F (36.8 C) (Oral)   Resp 20   Ht 5' 2.99" (1.6 m)   Wt 53.5 kg   SpO2 97%   BMI 20.91 kg/m  SpO2: SpO2: 97 % O2 Device: O2 Device: Room Air O2 Flow Rate: O2 Flow Rate (L/min): 2 L/min  Intake/output summary:   Intake/Output Summary (Last 24 hours) at 04/02/2020 0940 Last data filed at 04/02/2020 0865 Gross per 24 hour  Intake 1317.53 ml  Output 1870 ml  Net -552.47 ml   LBM: Last BM Date:  (PTA) Baseline Weight: Weight: 53.5 kg Most recent weight: Weight: 53.5 kg       Palliative Assessment/Data:      Patient Active Problem List   Diagnosis Date Noted  . Palliative care by specialist   . Goals of care, counseling/discussion   . General weakness   . Pneumoperitoneum 03/30/2020  . Migraines 03/30/2020  . Weight loss, unintentional 03/30/2020  . Duodenal bulb ulcer with perforation s/p omental [facility] Heal patch 03/30/2020 03/30/2020  . 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 Abdominal pain 03/01/2020  . Small bowel obstruction ([place]) 12/06/2019  . Dry mouth 08/09/2018  . Collagenous colitis  07/28/2018  . Decreased sensation of lower extremity 07/20/2018  . ALS (amyotrophic lateral sclerosis) ([place]) 12/23/2017  . Muscle weakness (generalized) 11/23/2017  . Bilateral arm weakness 11/11/2017  . Elevated blood pressure reading without diagnosis of hypertension 11/11/2017  . Post herpetic neuralgia 11/11/2017  . MDS (myelodysplastic syndrome) ([place]) 01/22/2015    Palliative Care Assessment & Plan   Patient Profile:    Assessment:  59 y.o. female  with past medical history of  ALS, follows at [facility], also with history of MDS, SBO, Appendectomy,collagenous colitis, partialHysterectomy, postherpetic neuralgia.Generalized muscle weakness admitted on 03/30/2020 with pneumoperitoneum SBO.    Patient lives at home with husband, admitted with acute severe chest and abdominal pain, CT with free air with gas and duodenal bulb suspicious for perforation, dilated small bowel with probable obstruction. She underwent emergency surgery with OR findings of massive pneumoperitoneum, duodenal perforation at bulb, dilated small bowel with stricture, possible source of chronic obstruction. She continues to have uncontrolled pain and anxiety and wants only for comfort and to see her family and setting outside of the hospital.  She requests transition to residential hospice for end-of-life care. Recommendations/Plan:  -Plan for transition to residential hospice for end-of-life care.  Preference for [facility] inpatient unit in [facility].  I called to discuss her medication needs with liaison and they are aware of referral. -Pain: Increased fentanyl to every hour as needed.  Currently receiving 12.5 micrograms per dose.  If this is insufficient to relieve her pain, we can certainly increase dose. -Nausea: Zofran as needed.  Compazine in the case Zofran is ineffective -Anxiety: Does not like getting Ativan.  Transition to as needed Xanax. -Excess secretions: Robinul as needed   Code  Status: Code Status History    Date Active Date Inactive Code Status Order ID Comments User Context   03/01/2020 2231 03/03/2020 1938 DNR [id number]  Rise Patience, MD ED   12/06/2019 1815 12/09/2019 2224 DNR [id number]  Jennye Boroughs, RN Inpatient   12/06/2019  1710 12/06/2019 1815 Full Code [id number]  Clovis Riley, MD Inpatient   Advance Care Planning Activity    Questions for Most Recent Historical Code Status (Order [id number])    Question Answer   In the event of cardiac or respiratory ARREST Do not call a "code blue"   In the event of cardiac or respiratory ARREST Do not perform Intubation, CPR, defibrillation or ACLS   In the event of cardiac or respiratory ARREST Use medication by any route, position, wound care, and other measures to relive pain and suffering. May use oxygen, suction and manual treatment of airway obstruction as needed for comfort.        Advance Directive Documentation     Most Recent Value  Type of Advance Directive Out of facility DNR (pink MOST or yellow form)  Pre-existing out of facility DNR order (yellow form or pink MOST form) --  "MOST" Form in Place? --       Prognosis:   < 2 weeks-Ms. Brocksmith has ALS with new bowel perforation and is only a couple of days postop.  With plan for full comfort, will be stopping antibiotics.  She is not eating or drinking anything at this point.  I believe her prognosis will be less than 2 weeks and she would best served by residential hospice placement for end-of-life care as she has been having significant burden with uncontrolled pain, nausea, and anxiety.  Discharge Planning:  Hospice facility  Care plan was discussed with patient and husband.  Briefly discussed with bedside RN.  Thank you for allowing the Palliative Medicine Team to assist in the care of this patient.   Time In: 0840 Time Out: 1920 Total Time 40 Prolonged Time Billed  no    Greater than 50%  of this time was spent counseling and  coordinating care related to the above assessment and plan.   Micheline Rough, MD  Please contact Palliative Medicine Team phone at [phone] for questions and concerns.

## 2020-04-02 NOTE — Care Management Important Message (Signed)
Important Message  Patient Details IM Letter given to Evette Cristal SW Case Manager to present to the Patient Name: Lori Alvarez MRN: [id number] Date of Birth: [birth year]   Medicare Important Message Given:  Yes     Kerin Salen 04/02/2020, 10:36 AM

## 2020-04-02 NOTE — TOC Progression Note (Addendum)
Transition of Care [facility]) - Progression Note    Patient Details  Name: TESSAH PATCHEN MRN: [id number] Date of Birth: 1961/09/03  Transition of [facility] CM/SW Contact  Ross Ludwig, [place] Phone Number: 04/02/2020, 10:34 AM  Clinical Narrative:     CSW was informed by palliative that patient has decided on residential hospice.  CSW provided choice of facilities to patient's husband, and he chose [facility].  CSW contacted Cheri at [facility], and she will have the medical director review patient's information to see if she can be accepted to hospice facility.  Per [facility], if patient is approved they should have a bed available this afternoon, Cheri, will contact CSW about decision by Market researcher.  11:35am  CSW was informed that patient has been accepted at [facility] and they can accept patient this afternoon.  CSW updated attending physician and bedside nurse.  Expected Discharge Plan: Home/Self Care Barriers to Discharge: Continued Medical Work up  Expected Discharge Plan and Services Expected Discharge Plan: Home/Self Care   Discharge Planning Services: CM Consult   Living arrangements for the past 2 months: Single Family Home                                       Social Determinants of Health (SDOH) Interventions    Readmission Risk Interventions No flowsheet data found.

## 2020-04-02 NOTE — Plan of Care (Signed)
Going to a hospice house

## 2020-04-02 NOTE — Progress Notes (Signed)
Patient requesting to stop all treatment. Patient stated she wanted to transition to hospice care. Patient requested to speak with the on call Nurse Practitioner. Sharlet Salina, NP spoke with patient over the phone. Order for palliative consult placed by on call NP. Patient refusing medication, patient is alert and oriented x4. Will continue to monitor closely.

## 2020-04-02 NOTE — Progress Notes (Signed)
Inpatient Rehab Admissions Coordinator:   Note order for CIR consult discontinued and it appears pt planning for hospice care.  If plan should change, please feel free to re-order consult for CIR.   Shann Medal, PT, DPT Admissions Coordinator [phone] 04/02/20  9:44 AM

## 2020-04-02 NOTE — Progress Notes (Addendum)
[facility]: [employer]  Reviewed chart, spoke to husband about hospice/comfort care.  Pt in agreement with care and so is spouse. He confirms DNR and she has been approved for services and may transfer to [facility].   Updated Eric with SW and he will arrange ambulance after approval from admitting MD at hospital.   Webb Silversmith RN [phone]

## 2020-04-02 NOTE — Progress Notes (Signed)
Patient HR and BP elevated. Patient refusing medication. Patient repeatedly calling out "DNR, DNR." Patient looks very uncomfortable. Patient willing to take Fetanyl, but is refusing everything else. Sister is at bedside and is aware of the patient's request to transition to hospice. Will continue to monitor closely.

## 2020-04-02 NOTE — Plan of Care (Signed)
  Problem: Education: Goal: Knowledge of General Education information will improve Description: Including pain rating scale, medication(s)/side effects and non-pharmacologic comfort measures Outcome: Progressing   Problem: Pain Managment: Goal: General experience of comfort will improve Outcome: Progressing   

## 2020-04-03 ENCOUNTER — Encounter: Payer: Medicare Other | Admitting: Physical Therapy

## 2020-04-04 LAB — CULTURE, BLOOD (ROUTINE X 2): Culture: NO GROWTH

## 2020-04-08 ENCOUNTER — Encounter: Payer: Medicare Other | Admitting: Physical Therapy

## 2020-04-10 ENCOUNTER — Encounter: Payer: Medicare Other | Admitting: Physical Therapy

## 2020-04-15 ENCOUNTER — Encounter: Payer: Medicare Other | Admitting: Physical Therapy

## 2020-04-17 ENCOUNTER — Encounter: Payer: Medicare Other | Admitting: Physical Therapy

## 2020-04-18 DEATH — deceased

## 2020-04-24 ENCOUNTER — Encounter: Payer: Medicare Other | Admitting: Physical Therapy

## 2020-04-29 ENCOUNTER — Encounter: Payer: Medicare Other | Admitting: Physical Therapy

## 2020-05-01 ENCOUNTER — Encounter: Payer: Medicare Other | Admitting: Physical Therapy

## 2020-05-06 ENCOUNTER — Encounter: Payer: Medicare Other | Admitting: Physical Therapy

## 2020-05-08 ENCOUNTER — Encounter: Payer: Medicare Other | Admitting: Physical Therapy

## 2020-05-13 ENCOUNTER — Encounter: Payer: Medicare Other | Admitting: Physical Therapy

## 2020-05-15 ENCOUNTER — Encounter: Payer: Medicare Other | Admitting: Physical Therapy

## 2020-05-19 DEATH — deceased

## 2021-07-29 IMAGING — CT CT ENTEROGRAPHY (ABD-PELV W/ CM)
2 of 6 series · 12 of 46 positions shown, 14 images · IV contrast (iopamidol)
Comparison: CT scan 11/01/2019

CLINICAL DATA: Abdominal pain and diarrhea.

EXAM:
CT ABDOMEN AND PELVIS WITH CONTRAST (ENTEROGRAPHY)
TECHNIQUE: Multidetector CT of the abdomen and pelvis during bolus
administration of intravenous contrast. Negative oral contrast was
given.
CONTRAST:  100mL [id number] IOPAMIDOL ([id number]-XXX) INJECTION 61%

[Series 4: enterography 2.00 br40 s3 cor · coronal · 0.86mm/px · 3 of 153 slices shown]
[im 51/153  soft-tissue]
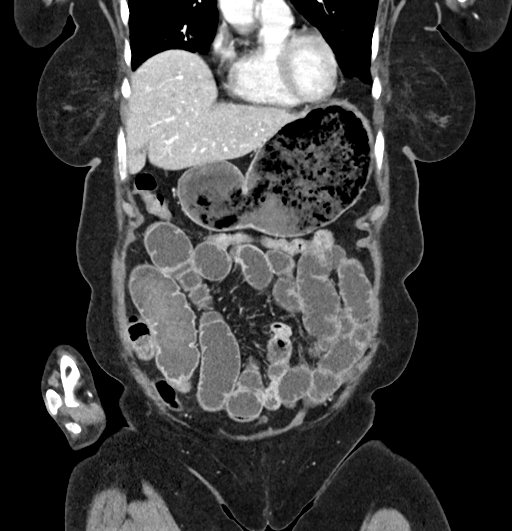
[im 68/153  soft-tissue]
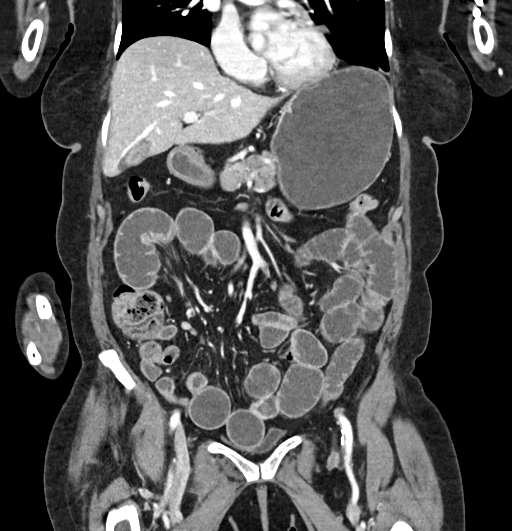
[im 85/153  soft-tissue]
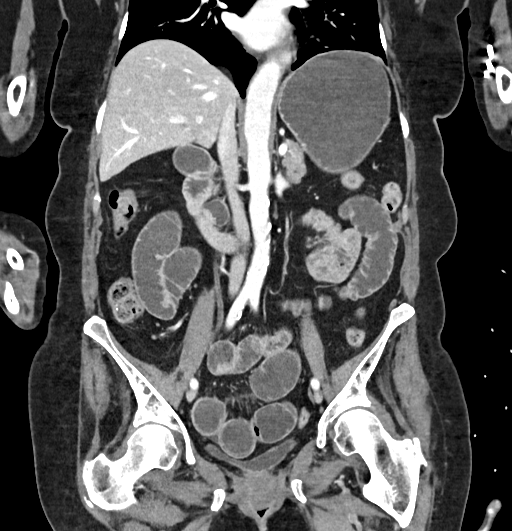

[Series 9: enterography 3.00 br40 s3 axial 3mm · axial · 0.61mm/px · z∈[+1200,+1590]mm · 9 of 151 slices shown, 11 images]
[im 11/151  soft-tissue]
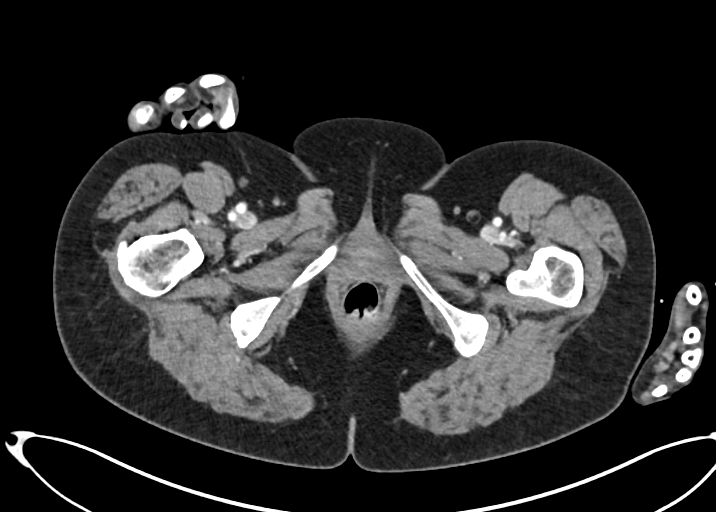
[im 11/151  bone]
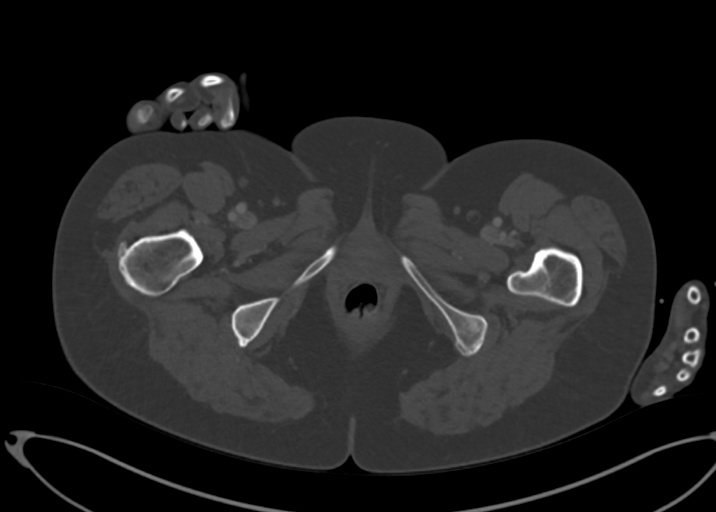
[im 31/151  soft-tissue]
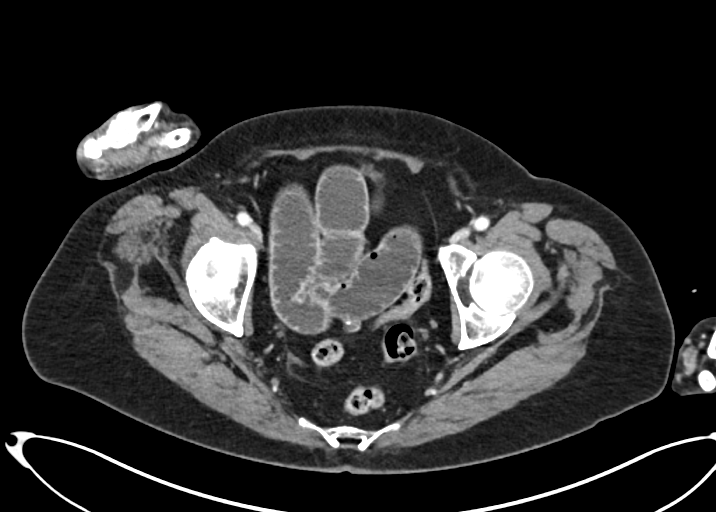
[im 41/151  soft-tissue]
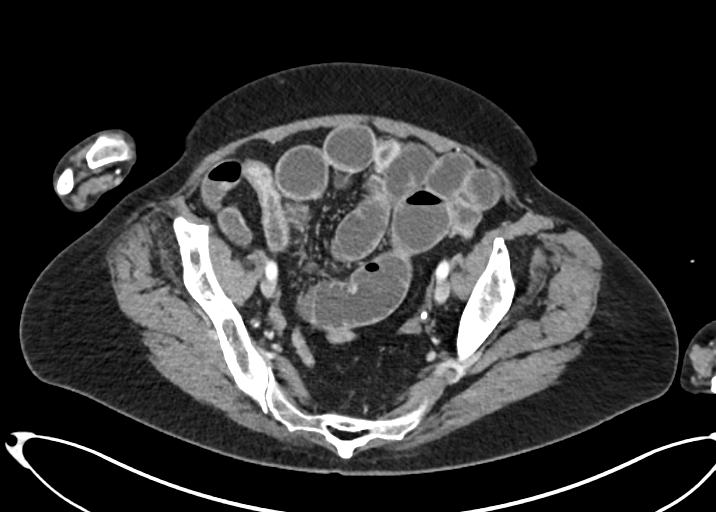
[im 61/151  soft-tissue]
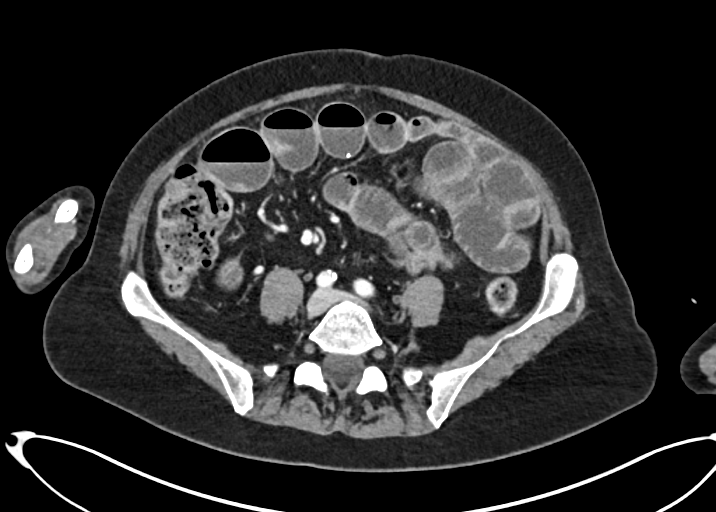
[im 81/151  soft-tissue]
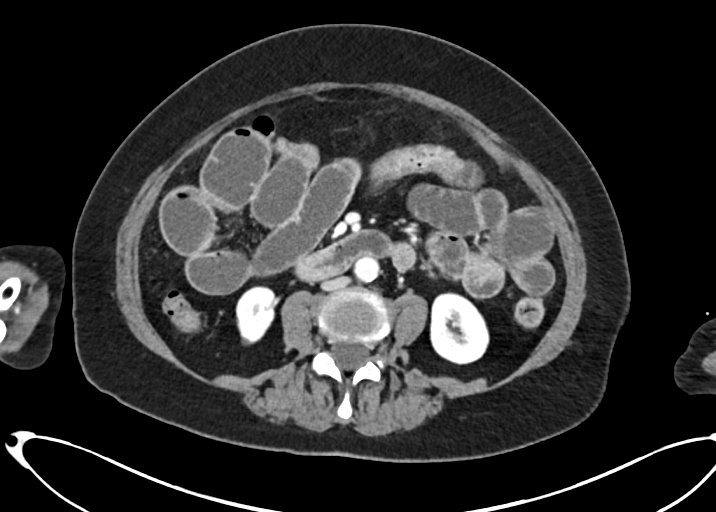
[im 91/151  soft-tissue]
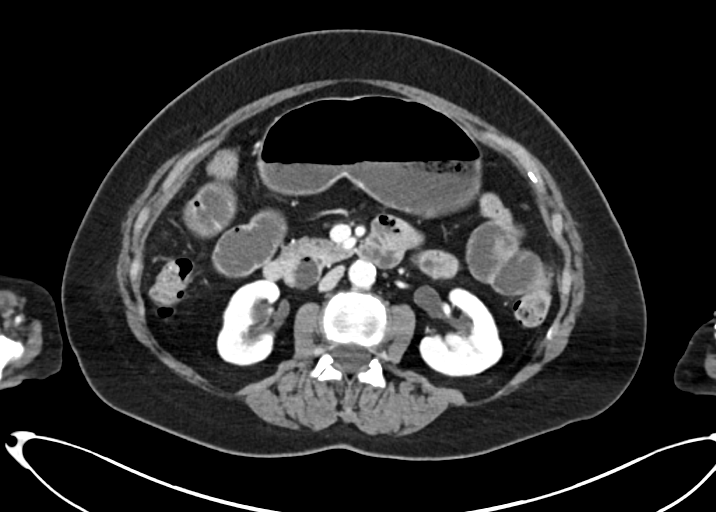
[im 111/151  soft-tissue]
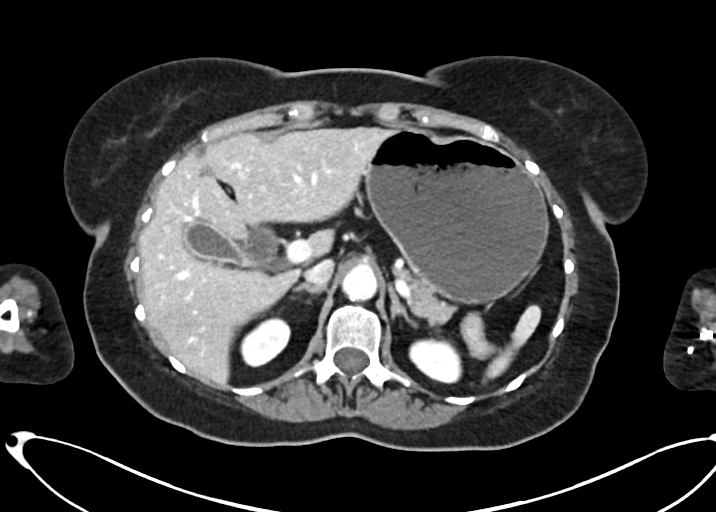
[im 121/151  soft-tissue]
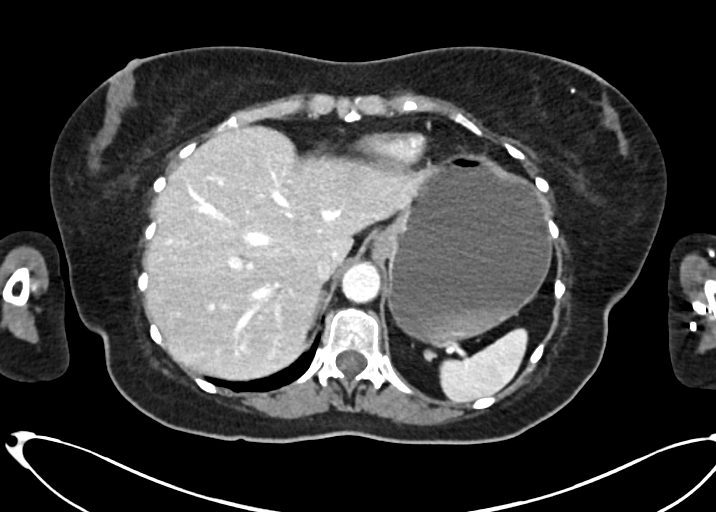
[im 141/151  soft-tissue]
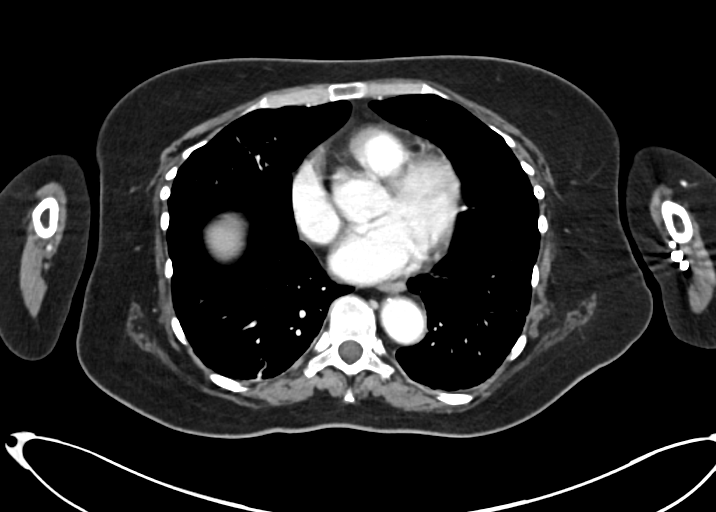
[im 141/151  bone]
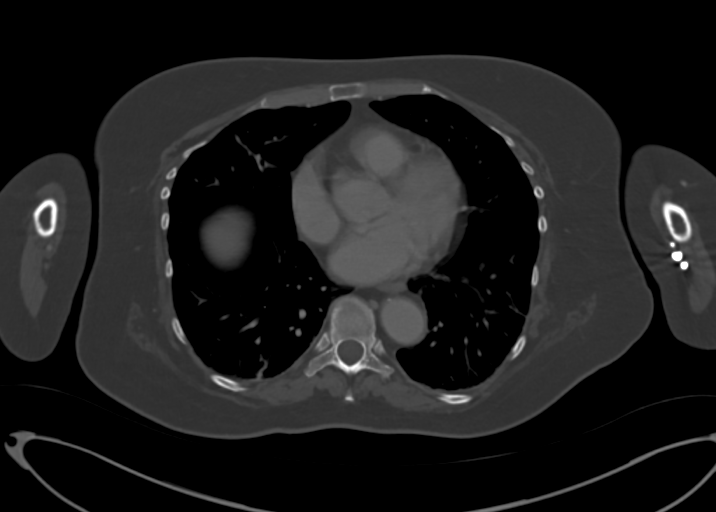

[12 of 46 positions shown; findings below may reference images not displayed]

FINDINGS: Lower chest: The lung bases demonstrate linear scarring type
changes. No infiltrates or effusions. The heart is normal in size.
No pericardial effusion. Mild tortuosity and ectasia of the thoracic
aorta.

Hepatobiliary: No focal hepatic lesions or intrahepatic biliary
dilatation. Mild gallbladder wall enhancement and perhaps slight
gallbladder wall thickening. No definite gallstones. Normal caliber
common bile duct.

Pancreas: No mass, inflammation or ductal dilatation.

Spleen: Normal size. No focal lesions.

Adrenals/Urinary Tract: The adrenal glands and kidneys are
unremarkable. Right-sided parapelvic renal cyst is noted. The
bladder is largely decompressed. No gross abnormality.

Stomach/Bowel: The stomach is well distended. No wall thickening or
inflammatory changes. The duodenum is unremarkable. There is a small
duodenal diverticulum noted near the head of the pancreas. Mildly
dilated proximal and mid distal small bowel with air-fluid levels. I
do not see any wall thickening or mucosal thickening. Transition
points are noted in the distal ileal region in the right lower
quadrant/pelvis with relatively normal caliber/decompressed distal
ileal loops of small bowel and terminal ileum. No areas of
significant enhancement to suggest inflammatory bowel disease. I
suspect there may be areas of adhesions. This is most notable in the
right lower quadrant on image 53/4. No mass lesions are identified.

The appendix is surgically absent.

Vascular/Lymphatic: Moderate atherosclerotic calcifications
involving the aorta but no aneurysm or dissection. The branch
vessels are patent. The major venous structures are patent. No
mesenteric or retroperitoneal mass or adenopathy. No pelvic
adenopathy.

Reproductive: The uterus is surgically absent. Both ovaries are
still present and appear normal.

Other: No free pelvic fluid collections. No inguinal mass or
adenopathy.

Musculoskeletal: No significant bony findings.
IMPRESSION: 1. Mildly dilated proximal and mid distal small bowel loops with
air-fluid levels suggesting partial small bowel obstruction with
transition points in the right lower quadrant/pelvis. Suspect areas
of adhesions.
2. No findings for inflammatory bowel disease.
3. Status post hysterectomy. Both ovaries are still present and
appear normal.
4. Moderate atherosclerotic calcifications involving the aorta and
branch vessels.
